# Patient Record
Sex: Male | Born: 1935 | Race: White | Hispanic: No | Marital: Married | Smoking: Never smoker
Health system: Southern US, Community
[De-identification: ages and names within clinical notes are randomized; demographics above are authoritative.]

## PROBLEM LIST (undated history)

## (undated) DIAGNOSIS — Z992 Dependence on renal dialysis: Secondary | ICD-10-CM

## (undated) DIAGNOSIS — C4491 Basal cell carcinoma of skin, unspecified: Secondary | ICD-10-CM

## (undated) DIAGNOSIS — N2581 Secondary hyperparathyroidism of renal origin: Secondary | ICD-10-CM

## (undated) DIAGNOSIS — I10 Essential (primary) hypertension: Secondary | ICD-10-CM

## (undated) DIAGNOSIS — N186 End stage renal disease: Secondary | ICD-10-CM

## (undated) DIAGNOSIS — N400 Enlarged prostate without lower urinary tract symptoms: Secondary | ICD-10-CM

## (undated) DIAGNOSIS — G4730 Sleep apnea, unspecified: Secondary | ICD-10-CM

## (undated) DIAGNOSIS — R9439 Abnormal result of other cardiovascular function study: Secondary | ICD-10-CM

## (undated) DIAGNOSIS — I639 Cerebral infarction, unspecified: Secondary | ICD-10-CM

## (undated) DIAGNOSIS — Z8719 Personal history of other diseases of the digestive system: Secondary | ICD-10-CM

## (undated) DIAGNOSIS — I739 Peripheral vascular disease, unspecified: Secondary | ICD-10-CM

## (undated) DIAGNOSIS — F0390 Unspecified dementia without behavioral disturbance: Secondary | ICD-10-CM

## (undated) DIAGNOSIS — D649 Anemia, unspecified: Secondary | ICD-10-CM

## (undated) DIAGNOSIS — Z8619 Personal history of other infectious and parasitic diseases: Secondary | ICD-10-CM

## (undated) HISTORY — DX: Anemia, unspecified: D64.9

## (undated) HISTORY — PX: ARTERIOVENOUS GRAFT PLACEMENT: SUR1029

## (undated) HISTORY — PX: THROMBECTOMY / ARTERIOVENOUS GRAFT REVISION: SUR1351

## (undated) HISTORY — PX: HERNIA REPAIR: SHX51

## (undated) HISTORY — DX: Peripheral vascular disease, unspecified: I73.9

## (undated) HISTORY — DX: Personal history of other infectious and parasitic diseases: Z86.19

## (undated) HISTORY — PX: CATARACT EXTRACTION: SUR2

## (undated) HISTORY — DX: Essential (primary) hypertension: I10

## (undated) HISTORY — PX: TONSILLECTOMY AND ADENOIDECTOMY: SUR1326

## (undated) HISTORY — DX: Benign prostatic hyperplasia without lower urinary tract symptoms: N40.0

## (undated) HISTORY — PX: APPENDECTOMY: SHX54

## (undated) HISTORY — DX: Secondary hyperparathyroidism of renal origin: N25.81

## (undated) HISTORY — DX: Cerebral infarction, unspecified: I63.9

---

## 1999-07-08 ENCOUNTER — Ambulatory Visit (HOSPITAL_COMMUNITY): Admission: RE | Admit: 1999-07-08 | Discharge: 1999-07-08 | Payer: Self-pay | Admitting: Family Medicine

## 1999-07-08 ENCOUNTER — Encounter: Payer: Self-pay | Admitting: Family Medicine

## 1999-08-13 ENCOUNTER — Encounter: Payer: Self-pay | Admitting: Ophthalmology

## 1999-08-18 ENCOUNTER — Ambulatory Visit (HOSPITAL_COMMUNITY): Admission: RE | Admit: 1999-08-18 | Discharge: 1999-08-18 | Payer: Self-pay | Admitting: Ophthalmology

## 2003-03-23 DIAGNOSIS — R9439 Abnormal result of other cardiovascular function study: Secondary | ICD-10-CM

## 2003-03-23 DIAGNOSIS — I639 Cerebral infarction, unspecified: Secondary | ICD-10-CM

## 2003-03-23 HISTORY — PX: FEMORAL BYPASS: SHX50

## 2003-03-23 HISTORY — DX: Cerebral infarction, unspecified: I63.9

## 2003-03-23 HISTORY — DX: Abnormal result of other cardiovascular function study: R94.39

## 2003-11-05 ENCOUNTER — Ambulatory Visit (HOSPITAL_COMMUNITY): Admission: RE | Admit: 2003-11-05 | Discharge: 2003-11-05 | Payer: Self-pay | Admitting: Family Medicine

## 2003-11-06 ENCOUNTER — Encounter: Admission: RE | Admit: 2003-11-06 | Discharge: 2003-11-06 | Payer: Self-pay | Admitting: Family Medicine

## 2003-11-28 ENCOUNTER — Encounter: Admission: RE | Admit: 2003-11-28 | Discharge: 2003-11-28 | Payer: Self-pay | Admitting: Family Medicine

## 2004-01-22 ENCOUNTER — Ambulatory Visit (HOSPITAL_COMMUNITY): Admission: RE | Admit: 2004-01-22 | Discharge: 2004-01-22 | Payer: Self-pay | Admitting: Vascular Surgery

## 2004-02-25 ENCOUNTER — Encounter (INDEPENDENT_AMBULATORY_CARE_PROVIDER_SITE_OTHER): Payer: Self-pay | Admitting: *Deleted

## 2004-02-25 ENCOUNTER — Inpatient Hospital Stay (HOSPITAL_COMMUNITY): Admission: AD | Admit: 2004-02-25 | Discharge: 2004-03-17 | Payer: Self-pay | Admitting: Vascular Surgery

## 2004-04-03 ENCOUNTER — Inpatient Hospital Stay (HOSPITAL_COMMUNITY): Admission: EM | Admit: 2004-04-03 | Discharge: 2004-04-16 | Payer: Self-pay | Admitting: Emergency Medicine

## 2004-04-29 ENCOUNTER — Inpatient Hospital Stay (HOSPITAL_COMMUNITY): Admission: EM | Admit: 2004-04-29 | Discharge: 2004-05-05 | Payer: Self-pay | Admitting: Emergency Medicine

## 2004-09-08 ENCOUNTER — Inpatient Hospital Stay (HOSPITAL_COMMUNITY): Admission: EM | Admit: 2004-09-08 | Discharge: 2004-09-16 | Payer: Self-pay | Admitting: Emergency Medicine

## 2004-11-13 ENCOUNTER — Inpatient Hospital Stay (HOSPITAL_COMMUNITY): Admission: EM | Admit: 2004-11-13 | Discharge: 2004-11-17 | Payer: Self-pay | Admitting: Emergency Medicine

## 2004-12-25 ENCOUNTER — Ambulatory Visit: Payer: Self-pay | Admitting: Physical Medicine & Rehabilitation

## 2004-12-25 ENCOUNTER — Inpatient Hospital Stay (HOSPITAL_COMMUNITY): Admission: EM | Admit: 2004-12-25 | Discharge: 2004-12-30 | Payer: Self-pay | Admitting: Emergency Medicine

## 2004-12-26 ENCOUNTER — Encounter (INDEPENDENT_AMBULATORY_CARE_PROVIDER_SITE_OTHER): Payer: Self-pay | Admitting: *Deleted

## 2004-12-26 HISTORY — PX: ABOVE KNEE LEG AMPUTATION: SUR20

## 2006-05-28 ENCOUNTER — Ambulatory Visit (HOSPITAL_COMMUNITY): Admission: RE | Admit: 2006-05-28 | Discharge: 2006-05-28 | Payer: Self-pay | Admitting: Vascular Surgery

## 2006-05-28 ENCOUNTER — Ambulatory Visit: Payer: Self-pay | Admitting: Vascular Surgery

## 2006-06-30 ENCOUNTER — Encounter: Admission: RE | Admit: 2006-06-30 | Discharge: 2006-06-30 | Payer: Self-pay | Admitting: Nephrology

## 2006-08-16 ENCOUNTER — Ambulatory Visit (HOSPITAL_COMMUNITY): Admission: RE | Admit: 2006-08-16 | Discharge: 2006-08-16 | Payer: Self-pay | Admitting: Nephrology

## 2006-08-23 ENCOUNTER — Encounter: Admission: RE | Admit: 2006-08-23 | Discharge: 2006-08-23 | Payer: Self-pay | Admitting: Nephrology

## 2006-08-25 ENCOUNTER — Encounter: Admission: RE | Admit: 2006-08-25 | Discharge: 2006-08-25 | Payer: Self-pay | Admitting: Nephrology

## 2007-02-02 ENCOUNTER — Ambulatory Visit (HOSPITAL_COMMUNITY): Admission: RE | Admit: 2007-02-02 | Discharge: 2007-02-02 | Payer: Self-pay | Admitting: Nephrology

## 2007-11-23 ENCOUNTER — Ambulatory Visit (HOSPITAL_COMMUNITY): Admission: RE | Admit: 2007-11-23 | Discharge: 2007-11-23 | Payer: Self-pay | Admitting: Vascular Surgery

## 2007-11-23 ENCOUNTER — Ambulatory Visit: Payer: Self-pay | Admitting: Vascular Surgery

## 2008-01-18 ENCOUNTER — Ambulatory Visit (HOSPITAL_COMMUNITY): Admission: RE | Admit: 2008-01-18 | Discharge: 2008-01-18 | Payer: Self-pay | Admitting: Nephrology

## 2008-04-25 ENCOUNTER — Ambulatory Visit (HOSPITAL_COMMUNITY): Admission: RE | Admit: 2008-04-25 | Discharge: 2008-04-25 | Payer: Self-pay | Admitting: Nephrology

## 2008-08-13 ENCOUNTER — Ambulatory Visit (HOSPITAL_COMMUNITY): Admission: RE | Admit: 2008-08-13 | Discharge: 2008-08-13 | Payer: Self-pay | Admitting: Nephrology

## 2008-12-19 ENCOUNTER — Ambulatory Visit (HOSPITAL_COMMUNITY): Admission: RE | Admit: 2008-12-19 | Discharge: 2008-12-19 | Payer: Self-pay | Admitting: Nephrology

## 2009-01-21 ENCOUNTER — Ambulatory Visit: Payer: Self-pay | Admitting: Vascular Surgery

## 2009-05-20 ENCOUNTER — Ambulatory Visit: Payer: Self-pay | Admitting: Vascular Surgery

## 2009-05-20 ENCOUNTER — Ambulatory Visit (HOSPITAL_COMMUNITY): Admission: RE | Admit: 2009-05-20 | Discharge: 2009-05-20 | Payer: Self-pay | Admitting: Surgery

## 2009-05-22 ENCOUNTER — Ambulatory Visit: Payer: Self-pay | Admitting: Cardiology

## 2009-05-22 ENCOUNTER — Inpatient Hospital Stay (HOSPITAL_COMMUNITY): Admission: EM | Admit: 2009-05-22 | Discharge: 2009-05-24 | Payer: Self-pay | Admitting: Emergency Medicine

## 2009-05-23 ENCOUNTER — Encounter: Payer: Self-pay | Admitting: Cardiology

## 2009-05-23 ENCOUNTER — Encounter (INDEPENDENT_AMBULATORY_CARE_PROVIDER_SITE_OTHER): Payer: Self-pay | Admitting: Internal Medicine

## 2009-06-05 ENCOUNTER — Encounter (INDEPENDENT_AMBULATORY_CARE_PROVIDER_SITE_OTHER): Payer: Self-pay | Admitting: Family Medicine

## 2009-07-08 ENCOUNTER — Ambulatory Visit: Payer: Self-pay | Admitting: Vascular Surgery

## 2009-07-08 ENCOUNTER — Ambulatory Visit (HOSPITAL_COMMUNITY): Admission: RE | Admit: 2009-07-08 | Discharge: 2009-07-08 | Payer: Self-pay | Admitting: Vascular Surgery

## 2009-09-23 ENCOUNTER — Ambulatory Visit (HOSPITAL_COMMUNITY): Admission: RE | Admit: 2009-09-23 | Discharge: 2009-09-23 | Payer: Self-pay | Admitting: Nephrology

## 2009-10-14 ENCOUNTER — Ambulatory Visit (HOSPITAL_COMMUNITY): Admission: RE | Admit: 2009-10-14 | Discharge: 2009-10-14 | Payer: Self-pay | Admitting: Nephrology

## 2010-04-12 ENCOUNTER — Encounter: Payer: Self-pay | Admitting: Nephrology

## 2010-04-21 ENCOUNTER — Ambulatory Visit (HOSPITAL_COMMUNITY)
Admission: RE | Admit: 2010-04-21 | Discharge: 2010-04-21 | Payer: Self-pay | Source: Home / Self Care | Attending: Nephrology | Admitting: Nephrology

## 2010-05-29 ENCOUNTER — Other Ambulatory Visit (HOSPITAL_COMMUNITY): Payer: Self-pay | Admitting: Nephrology

## 2010-05-29 DIAGNOSIS — N186 End stage renal disease: Secondary | ICD-10-CM

## 2010-06-11 ENCOUNTER — Ambulatory Visit (HOSPITAL_COMMUNITY)
Admission: RE | Admit: 2010-06-11 | Discharge: 2010-06-11 | Disposition: A | Discharge status: Home / Self Care | Payer: Medicare Other | Source: Ambulatory Visit | Attending: Nephrology | Admitting: Nephrology

## 2010-06-11 ENCOUNTER — Other Ambulatory Visit (HOSPITAL_COMMUNITY): Payer: Self-pay | Admitting: Nephrology

## 2010-06-11 DIAGNOSIS — N186 End stage renal disease: Secondary | ICD-10-CM

## 2010-06-11 DIAGNOSIS — Z8673 Personal history of transient ischemic attack (TIA), and cerebral infarction without residual deficits: Secondary | ICD-10-CM | POA: Insufficient documentation

## 2010-06-11 DIAGNOSIS — I739 Peripheral vascular disease, unspecified: Secondary | ICD-10-CM | POA: Insufficient documentation

## 2010-06-11 DIAGNOSIS — T82898A Other specified complication of vascular prosthetic devices, implants and grafts, initial encounter: Secondary | ICD-10-CM | POA: Insufficient documentation

## 2010-06-11 DIAGNOSIS — I120 Hypertensive chronic kidney disease with stage 5 chronic kidney disease or end stage renal disease: Secondary | ICD-10-CM | POA: Insufficient documentation

## 2010-06-11 DIAGNOSIS — E785 Hyperlipidemia, unspecified: Secondary | ICD-10-CM | POA: Insufficient documentation

## 2010-06-11 DIAGNOSIS — D649 Anemia, unspecified: Secondary | ICD-10-CM | POA: Insufficient documentation

## 2010-06-11 DIAGNOSIS — N400 Enlarged prostate without lower urinary tract symptoms: Secondary | ICD-10-CM | POA: Insufficient documentation

## 2010-06-11 DIAGNOSIS — N2581 Secondary hyperparathyroidism of renal origin: Secondary | ICD-10-CM | POA: Insufficient documentation

## 2010-06-11 DIAGNOSIS — Y832 Surgical operation with anastomosis, bypass or graft as the cause of abnormal reaction of the patient, or of later complication, without mention of misadventure at the time of the procedure: Secondary | ICD-10-CM | POA: Insufficient documentation

## 2010-06-11 MED ORDER — IOHEXOL 300 MG/ML  SOLN
100.0000 mL | Freq: Once | INTRAMUSCULAR | Status: AC | PRN
  Administered 2010-06-11: 40 mL via INTRAVENOUS

## 2010-06-12 LAB — COMPREHENSIVE METABOLIC PANEL
ALT: <8 U/L (ref 0–53)
AST: 17 U/L (ref 0–37)
Alkaline Phosphatase: 86 U/L (ref 39–117)
CO2: 29 mEq/L (ref 19–32)
Calcium: 8.6 mg/dL (ref 8.4–10.5)
GFR calc Af Amer: 21 mL/min — ABNORMAL LOW (ref >60)
GFR calc non Af Amer: 17 mL/min — ABNORMAL LOW (ref >60)
Potassium: 3.4 mEq/L — ABNORMAL LOW (ref 3.5–5.1)
Sodium: 136 mEq/L (ref 135–145)
Total Protein: 6.3 g/dL (ref 6.0–8.3)

## 2010-06-12 LAB — BASIC METABOLIC PANEL
BUN: 29 mg/dL — ABNORMAL HIGH (ref 6–23)
Calcium: 8.3 mg/dL — ABNORMAL LOW (ref 8.4–10.5)
Chloride: 98 mEq/L (ref 96–112)
Creatinine, Ser: 4.04 mg/dL — ABNORMAL HIGH (ref 0.4–1.5)
GFR calc Af Amer: 17 mL/min — ABNORMAL LOW (ref >60)
Glucose, Bld: 95 mg/dL (ref 70–99)
Potassium: 3.7 mEq/L (ref 3.5–5.1)

## 2010-06-12 LAB — CARDIAC PANEL(CRET KIN+CKTOT+MB+TROPI)
CK, MB: 4.5 ng/mL — ABNORMAL HIGH (ref 0.3–4.0)
CK, MB: 4.8 ng/mL — ABNORMAL HIGH (ref 0.3–4.0)
CK, MB: 5.6 ng/mL — ABNORMAL HIGH (ref 0.3–4.0)
Relative Index: 3.2 — ABNORMAL HIGH (ref 0.0–2.5)
Relative Index: 3.3 — ABNORMAL HIGH (ref 0.0–2.5)
Total CK: 152 U/L (ref 7–232)
Total CK: 169 U/L (ref 7–232)

## 2010-06-12 LAB — LIPID PANEL
Cholesterol: 173 mg/dL (ref 0–200)
HDL: 34 mg/dL — ABNORMAL LOW (ref >39)
LDL Cholesterol: 110 mg/dL — ABNORMAL HIGH (ref 0–99)
Total CHOL/HDL Ratio: 5.1 RATIO
Triglycerides: 143 mg/dL (ref <150)
VLDL: 29 mg/dL (ref 0–40)

## 2010-06-12 LAB — RENAL FUNCTION PANEL
Albumin: 3 g/dL — ABNORMAL LOW (ref 3.5–5.2)
BUN: 41 mg/dL — ABNORMAL HIGH (ref 6–23)
Creatinine, Ser: 6.12 mg/dL — ABNORMAL HIGH (ref 0.4–1.5)
GFR calc Af Amer: 11 mL/min — ABNORMAL LOW (ref >60)
Phosphorus: 5.2 mg/dL — ABNORMAL HIGH (ref 2.3–4.6)

## 2010-06-12 LAB — DIFFERENTIAL
Basophils Absolute: 0 10*3/uL (ref 0.0–0.1)
Basophils Relative: 0 % (ref 0–1)
Eosinophils Absolute: 0.1 10*3/uL (ref 0.0–0.7)
Eosinophils Relative: 1 % (ref 0–5)

## 2010-06-12 LAB — CBC
HCT: 27.3 % — ABNORMAL LOW (ref 39.0–52.0)
HCT: 28.2 % — ABNORMAL LOW (ref 39.0–52.0)
Hemoglobin: 9 g/dL — ABNORMAL LOW (ref 13.0–17.0)
Hemoglobin: 9.4 g/dL — ABNORMAL LOW (ref 13.0–17.0)
MCHC: 33.7 g/dL (ref 30.0–36.0)
MCHC: 34.3 g/dL (ref 30.0–36.0)
MCV: 96.3 fL (ref 78.0–100.0)
MCV: 97 fL (ref 78.0–100.0)
Platelets: 160 10*3/uL (ref 150–400)
Platelets: 174 10*3/uL (ref 150–400)
RBC: 2.72 MIL/uL — ABNORMAL LOW (ref 4.22–5.81)
RBC: 2.88 MIL/uL — ABNORMAL LOW (ref 4.22–5.81)
RDW: 15.9 % — ABNORMAL HIGH (ref 11.5–15.5)
RDW: 16.7 % — ABNORMAL HIGH (ref 11.5–15.5)
WBC: 7.9 10*3/uL (ref 4.0–10.5)

## 2010-06-12 LAB — POCT I-STAT 4, (NA,K, GLUC, HGB,HCT)
Hemoglobin: 10.9 g/dL — ABNORMAL LOW (ref 13.0–17.0)
Potassium: 5.7 mEq/L — ABNORMAL HIGH (ref 3.5–5.1)

## 2010-06-12 LAB — PROTIME-INR
INR: 1 (ref 0.00–1.49)
Prothrombin Time: 13.1 seconds (ref 11.6–15.2)

## 2010-06-12 LAB — POCT CARDIAC MARKERS: Troponin i, poc: <0.05 ng/mL (ref 0.00–0.09)

## 2010-07-01 ENCOUNTER — Other Ambulatory Visit (HOSPITAL_COMMUNITY): Payer: Self-pay | Admitting: Nephrology

## 2010-07-01 DIAGNOSIS — N186 End stage renal disease: Secondary | ICD-10-CM

## 2010-07-02 ENCOUNTER — Other Ambulatory Visit (HOSPITAL_COMMUNITY): Payer: Self-pay | Admitting: Nephrology

## 2010-07-02 ENCOUNTER — Ambulatory Visit (HOSPITAL_COMMUNITY)
Admission: RE | Admit: 2010-07-02 | Discharge: 2010-07-02 | Disposition: A | Discharge status: Home / Self Care | Payer: Medicare Other | Source: Ambulatory Visit | Attending: Nephrology | Admitting: Nephrology

## 2010-07-02 DIAGNOSIS — N186 End stage renal disease: Secondary | ICD-10-CM

## 2010-07-02 DIAGNOSIS — Y832 Surgical operation with anastomosis, bypass or graft as the cause of abnormal reaction of the patient, or of later complication, without mention of misadventure at the time of the procedure: Secondary | ICD-10-CM | POA: Insufficient documentation

## 2010-07-02 DIAGNOSIS — T82898A Other specified complication of vascular prosthetic devices, implants and grafts, initial encounter: Secondary | ICD-10-CM | POA: Insufficient documentation

## 2010-07-02 MED ORDER — IOHEXOL 300 MG/ML  SOLN: 50.0000 mL | Freq: Once | INTRAMUSCULAR | Status: AC | PRN

## 2010-07-06 ENCOUNTER — Ambulatory Visit (HOSPITAL_COMMUNITY)
Admission: RE | Admit: 2010-07-06 | Discharge: 2010-07-06 | Disposition: A | Discharge status: Home / Self Care | Payer: Medicare Other | Source: Ambulatory Visit | Attending: Vascular Surgery | Admitting: Vascular Surgery

## 2010-07-06 DIAGNOSIS — Z8673 Personal history of transient ischemic attack (TIA), and cerebral infarction without residual deficits: Secondary | ICD-10-CM | POA: Insufficient documentation

## 2010-07-06 DIAGNOSIS — I120 Hypertensive chronic kidney disease with stage 5 chronic kidney disease or end stage renal disease: Secondary | ICD-10-CM | POA: Insufficient documentation

## 2010-07-06 DIAGNOSIS — T82898A Other specified complication of vascular prosthetic devices, implants and grafts, initial encounter: Secondary | ICD-10-CM | POA: Insufficient documentation

## 2010-07-06 DIAGNOSIS — Z7982 Long term (current) use of aspirin: Secondary | ICD-10-CM | POA: Insufficient documentation

## 2010-07-06 DIAGNOSIS — Z992 Dependence on renal dialysis: Secondary | ICD-10-CM | POA: Insufficient documentation

## 2010-07-06 DIAGNOSIS — Z79899 Other long term (current) drug therapy: Secondary | ICD-10-CM | POA: Insufficient documentation

## 2010-07-06 DIAGNOSIS — N186 End stage renal disease: Secondary | ICD-10-CM | POA: Insufficient documentation

## 2010-07-06 DIAGNOSIS — Y832 Surgical operation with anastomosis, bypass or graft as the cause of abnormal reaction of the patient, or of later complication, without mention of misadventure at the time of the procedure: Secondary | ICD-10-CM | POA: Insufficient documentation

## 2010-07-06 DIAGNOSIS — I739 Peripheral vascular disease, unspecified: Secondary | ICD-10-CM | POA: Insufficient documentation

## 2010-07-07 ENCOUNTER — Emergency Department (HOSPITAL_COMMUNITY): Payer: Medicare Other

## 2010-07-07 ENCOUNTER — Ambulatory Visit (HOSPITAL_COMMUNITY)
Admission: EM | Admit: 2010-07-07 | Discharge: 2010-07-07 | Disposition: A | Discharge status: Home / Self Care | Payer: Medicare Other | Attending: Emergency Medicine | Admitting: Emergency Medicine

## 2010-07-07 ENCOUNTER — Ambulatory Visit (HOSPITAL_COMMUNITY): Payer: Medicare Other

## 2010-07-07 ENCOUNTER — Observation Stay (HOSPITAL_COMMUNITY)
Admission: AD | Admit: 2010-07-07 | Discharge: 2010-07-08 | Disposition: A | Discharge status: Home / Self Care | Payer: Medicare Other | Source: Ambulatory Visit | Attending: Vascular Surgery | Admitting: Vascular Surgery

## 2010-07-07 DIAGNOSIS — W010XXA Fall on same level from slipping, tripping and stumbling without subsequent striking against object, initial encounter: Secondary | ICD-10-CM | POA: Insufficient documentation

## 2010-07-07 DIAGNOSIS — N186 End stage renal disease: Secondary | ICD-10-CM | POA: Insufficient documentation

## 2010-07-07 DIAGNOSIS — J449 Chronic obstructive pulmonary disease, unspecified: Secondary | ICD-10-CM | POA: Insufficient documentation

## 2010-07-07 DIAGNOSIS — I120 Hypertensive chronic kidney disease with stage 5 chronic kidney disease or end stage renal disease: Secondary | ICD-10-CM | POA: Insufficient documentation

## 2010-07-07 DIAGNOSIS — J4489 Other specified chronic obstructive pulmonary disease: Secondary | ICD-10-CM | POA: Insufficient documentation

## 2010-07-07 DIAGNOSIS — S78119A Complete traumatic amputation at level between unspecified hip and knee, initial encounter: Secondary | ICD-10-CM | POA: Insufficient documentation

## 2010-07-07 DIAGNOSIS — E785 Hyperlipidemia, unspecified: Secondary | ICD-10-CM | POA: Insufficient documentation

## 2010-07-07 DIAGNOSIS — T82898A Other specified complication of vascular prosthetic devices, implants and grafts, initial encounter: Secondary | ICD-10-CM

## 2010-07-07 DIAGNOSIS — Z8673 Personal history of transient ischemic attack (TIA), and cerebral infarction without residual deficits: Secondary | ICD-10-CM | POA: Insufficient documentation

## 2010-07-07 DIAGNOSIS — I739 Peripheral vascular disease, unspecified: Secondary | ICD-10-CM | POA: Insufficient documentation

## 2010-07-07 DIAGNOSIS — Z992 Dependence on renal dialysis: Secondary | ICD-10-CM | POA: Insufficient documentation

## 2010-07-07 DIAGNOSIS — Y92009 Unspecified place in unspecified non-institutional (private) residence as the place of occurrence of the external cause: Secondary | ICD-10-CM | POA: Insufficient documentation

## 2010-07-07 DIAGNOSIS — R0602 Shortness of breath: Secondary | ICD-10-CM | POA: Insufficient documentation

## 2010-07-07 LAB — BASIC METABOLIC PANEL
Calcium: 8.6 mg/dL (ref 8.4–10.5)
GFR calc Af Amer: 5 mL/min — ABNORMAL LOW (ref >60)
Potassium: 4.3 mEq/L (ref 3.5–5.1)
Sodium: 137 mEq/L (ref 135–145)

## 2010-07-07 LAB — POCT I-STAT 4, (NA,K, GLUC, HGB,HCT)
HCT: 38 % — ABNORMAL LOW (ref 39.0–52.0)
Hemoglobin: 12.9 g/dL — ABNORMAL LOW (ref 13.0–17.0)

## 2010-07-08 ENCOUNTER — Observation Stay (HOSPITAL_COMMUNITY): Payer: Medicare Other

## 2010-07-08 LAB — CBC
HCT: 27.9 % — ABNORMAL LOW (ref 39.0–52.0)
MCH: 32.1 pg (ref 26.0–34.0)
MCHC: 33.3 g/dL (ref 30.0–36.0)
MCV: 96.2 fL (ref 78.0–100.0)
RDW: 16.5 % — ABNORMAL HIGH (ref 11.5–15.5)

## 2010-07-08 LAB — RENAL FUNCTION PANEL
BUN: 109 mg/dL — ABNORMAL HIGH (ref 6–23)
Calcium: 8.4 mg/dL (ref 8.4–10.5)
Glucose, Bld: 111 mg/dL — ABNORMAL HIGH (ref 70–99)
Phosphorus: 8.6 mg/dL — ABNORMAL HIGH (ref 2.3–4.6)

## 2010-07-08 LAB — POCT I-STAT 4, (NA,K, GLUC, HGB,HCT)
Potassium: 4.4 mEq/L (ref 3.5–5.1)
Sodium: 137 mEq/L (ref 135–145)

## 2010-07-09 NOTE — Op Note (Signed)
  NAME:  Logan [name], Logan [name]          ACCOUNT NO.:  [id number]  MEDICAL RECORD NO.:  [id number]           PATIENT TYPE:  O  LOCATION:  6727                         FACILITY:  [facility]  PHYSICIAN:  Di Kindle. Edilia Bo, M.D.DATE OF BIRTH:  Apr 18, 1919  DATE OF PROCEDURE: DATE OF DISCHARGE:                              OPERATIVE REPORT   PREOPERATIVE DIAGNOSIS:  Chronic kidney disease.  POSTOPERATIVE DIAGNOSIS:  Chronic kidney disease.  PROCEDURE:  Ultrasound-guided placement of a right IJ 19-cm Diatek catheter.  SURGEON:  Di Kindle. Edilia Bo, M.D.  ANESTHESIA:  Local with sedation technique.  The patient was taken to the operating room sedated by Anesthesia.  The neck and upper chest were prepped and draped in the usual sterile fashion.  After the skin was anesthetized with 1% lidocaine and under ultrasound guidance, the right IJ was cannulated and a guidewire introduced into the superior vena cava under fluoroscopic control. Tract over the wire was dilated and then a dilator and peel-away sheath were advanced over the wire and the wire and dilator removed.  The catheter was passed through the peel-away sheath and positioned in the right atrium.  The exit site for the catheter was selected and the skin anesthetized between the 2 areas.  Catheter was then brought through the tunnel cut to the appropriate length and the distal ports were attached. Both ports withdrew easily.  We then flushed with heparinized saline and filled with concentrated heparin.  Catheter was secured at its exit site with a 3-0 nylon suture.  The IJ cannulation site was closed with a 4-0 subcuticular stitch.  A sterile dressing was applied.  The patient tolerated the procedure well, was transferred to recovery room in stable condition.  All needle and sponge counts were correct.     Di Kindle. Edilia Bo, M.D.     CSD/MEDQ  D:  07/07/2010  T:  07/08/2010  Job:  [id number]  Electronically Signed by  Waverly Ferrari M.D. on 07/09/2010 01:10:31 PM

## 2010-07-13 NOTE — Op Note (Signed)
NAME:  Logan [name], Logan [name] NO.:  [id number]  MEDICAL RECORD NO.:  [id number]           PATIENT TYPE:  O  LOCATION:  SDSC                         FACILITY:  [facility]  PHYSICIAN:  Janetta Hora. Kareem Aul, MD  DATE OF BIRTH:  1920-01-09  DATE OF PROCEDURE:  07/06/2010 DATE OF DISCHARGE:                              OPERATIVE REPORT   PROCEDURE:  Thrombectomy revision, left upper arm arteriovenous graft.  PREOPERATIVE DIAGNOSIS:  Thrombosed arteriovenous graft, left arm.  POSTOPERATIVE DIAGNOSIS:  Thrombosed arteriovenous graft, left arm.  ANESTHESIA:  Local with IV sedation.  SURGEON:  Janetta Hora. Desmund Elman, MD  ASSISTANT:  Della Goo, PA-C  OPERATIVE FINDINGS: 1. A 6-mm interposition graft to high axillary vein. 2. Diffuse thickening of axillary subclavian system, left side.  OPERATIVE DETAILS:  After obtaining informed consent, the patient was taken to the operating room.  The patient was placed in supine position on the operating table.  After adequate sedation, the patient's entire left upper extremity was prepped and draped in the usual sterile fashion.  Local anesthesia was infiltrated at a preexisting longitudinal scar up in the left axilla.  Incision was reopened and carried down through the subcutaneous tissues down the level of graft.  Grafts was dissected free circumferentially.  It was well incorporated.  Dissection was then carried up on to the venous anastomosis and to the vein distal to this.  The vein was fairly sclerotic and external appearance and I dissected this very high up into the axilla to get to a more normal- appearing segment of vein.  Vein was dissected free in this location. Next, the patient was given 5000 units of intravenous heparin.  A transverse graftotomy was made just below the venous anastomosis and a #4 Fogarty catheter was used to thrombectomize the venous limb of the graft.  Catheter passed into the central venous system, but  there were several areas of resistance throughout the subclavian axillary system. I took down the venous anastomosis and then opened the vein longitudinally and debrided away the thickened segment at the anastomosis.  The vein was diffusely thickened above this, but of reasonable quality.  The worst sections of vein were debrided away.  The vein was controlled proximally with a fine bulldog clamp.  Next, the arterial limb of graft was thrombectomized with multiple passes of #4 Fogarty catheter.  An arterialized plug was retrieved and this was easily thrombectomized with excellent arterial inflow.  Graft was then clamped proximally.  Then, a 6-mm interposition graft was brought up in the operative field and this was beveled on the distal end and sewn end to end to the distal axillary vein using a running 6-0 Prolene suture. Just prior to completion of the anastomosis, this was forebled, backbled, and thoroughly flushed.  Anastomosis was secured.  Everything was thoroughly backbled and there was good venous backbleeding at this point.  The graft was then cut to length and sewn end to end to the proximal portion of the graft using a running 6-0 Prolene suture.  Just prior to completion of anastomosis, this was forebled, backbled, and thoroughly flushed.  Anastomosis was secured.  Clamps were released. There  was palpable thrill in the graft immediately.  Hemostasis was obtained with thrombin and Gelfoam and administration of 50 mg protamine.  Subcutaneous tissues were then reapproximated using running 3-0 Vicryl suture.  The skin was closed with 4-0 Vicryl subcuticular stitch.  The patient tolerated the procedure well and there were no complications.  Instrument, sponge, and needle counts were correct at the end of the case.  The patient was taken to the recovery room in stable condition.     Janetta Hora. Delanee Xin, MD     CEF/MEDQ  D:  07/06/2010  T:  07/07/2010  Job:   [id number]  Electronically Signed by Fabienne Bruns MD on 07/13/2010 11:07:45 AM

## 2010-07-14 ENCOUNTER — Ambulatory Visit: Payer: Medicare Other | Admitting: Vascular Surgery

## 2010-07-22 ENCOUNTER — Ambulatory Visit: Payer: Medicare Other | Admitting: Vascular Surgery

## 2010-07-30 ENCOUNTER — Ambulatory Visit (INDEPENDENT_AMBULATORY_CARE_PROVIDER_SITE_OTHER): Payer: Medicare Other | Admitting: Vascular Surgery

## 2010-07-30 ENCOUNTER — Encounter (INDEPENDENT_AMBULATORY_CARE_PROVIDER_SITE_OTHER): Payer: Medicare Other

## 2010-07-30 DIAGNOSIS — Z01810 Encounter for preprocedural cardiovascular examination: Secondary | ICD-10-CM

## 2010-07-30 DIAGNOSIS — N184 Chronic kidney disease, stage 4 (severe): Secondary | ICD-10-CM

## 2010-07-30 DIAGNOSIS — N186 End stage renal disease: Secondary | ICD-10-CM

## 2010-07-31 NOTE — Assessment & Plan Note (Signed)
OFFICE VISIT  Logan [name], Logan [name] DOB:  12-16-1919                                       07/30/2010 VFIEP#:[id number]  CHIEF COMPLAINT:  Needs hemodialysis access.  HISTORY OF PRESENT ILLNESS:  The patient is an 77 year old male who has been on chronic hemodialysis for several years.  He has previously had [name] left forearm and left upper arm graft.  Recently his left upper arm graft failed and [name] catheter was placed on the right side.  He has currently been dialyzing via this.  He presents today for further consideration of placement of [name] new hemodialysis access.  All his previous grafts have been in the left arm.  He has had no prior right access procedures.  CHRONIC MEDICAL PROBLEMS:  Include hypertension, end-stage renal disease and previous stroke.  All of these problems are currently stable.  He dialyzes on Monday, Wednesday and Friday.  REVIEW OF SYSTEMS:  He has no shortness of breath or chest pain currently.  PHYSICAL EXAM:  Vital signs:  Blood pressure is 136/54 in the right arm, heart rate 63 and regular.  Temperature is 98.3.  Right upper extremity has [name] 2+ brachial and radial pulse.  He has occluded grafts in the left upper extremity and left forearm.  Chest:  Clear to auscultation. Cardiac:  Regular rate and rhythm.  He had [name] vein mapping ultrasound performed today which I reviewed and interpreted.  The right cephalic vein is too small for consideration for use as an AV fistula.  It was between 20 and 30 mm in diameter.  Right basilic vein however is between 30 and 60 mm in diameter and I think would be [name] reasonable vein to consider for basilic vein transposition.  In summary, I believe the best option for the patient at this point for his next access would be consideration for placement of basilic vein transposition fistula.  Since he is fairly frail if the vein appears marginal we may do this as [name] two stage procedure.  However, if it is  [name] fairly robust vein we will plan on doing the entire transposition at the initial operation.  This is scheduled for 08/04/2010.  Risks, benefits, possible complications and procedure details were explained to the patient and his wife today.  They understand and agree to proceed.    Janetta Hora. Marquette Piontek, MD Electronically Signed  CEF/MEDQ  D:  07/30/2010  T:  07/31/2010  Job:  [id number]  cc:   Duke Salvia. Eliott Nine, M.D.

## 2010-08-04 ENCOUNTER — Ambulatory Visit (HOSPITAL_COMMUNITY)
Admission: RE | Admit: 2010-08-04 | Discharge: 2010-08-04 | Disposition: A | Discharge status: Home / Self Care | Payer: Medicare Other | Source: Ambulatory Visit | Attending: Vascular Surgery | Admitting: Vascular Surgery

## 2010-08-04 DIAGNOSIS — N186 End stage renal disease: Secondary | ICD-10-CM | POA: Insufficient documentation

## 2010-08-04 DIAGNOSIS — I120 Hypertensive chronic kidney disease with stage 5 chronic kidney disease or end stage renal disease: Secondary | ICD-10-CM

## 2010-08-04 DIAGNOSIS — Z8673 Personal history of transient ischemic attack (TIA), and cerebral infarction without residual deficits: Secondary | ICD-10-CM | POA: Insufficient documentation

## 2010-08-04 LAB — POCT I-STAT 4, (NA,K, GLUC, HGB,HCT)
Hemoglobin: 12.6 g/dL — ABNORMAL LOW (ref 13.0–17.0)
Potassium: 4.1 mEq/L (ref 3.5–5.1)

## 2010-08-04 LAB — SURGICAL PCR SCREEN: Staphylococcus aureus: NEGATIVE

## 2010-08-04 NOTE — Assessment & Plan Note (Signed)
OFFICE VISIT   Logan Logan [name], Logan Logan [name]  DOB:  1919/06/04                                       01/21/2009  VWUJW#:[id number]   The patient is an 77 year old male patient referred by Dr. 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  The patient has end-stage renal  disease and has had an AV graft in the left forearm utilized for the  last several years which has been revised by [place] most recently in  September of 2009 to the more proximal brachial vein.  He also has had  thrombotic episodes most recent of which was in September of 2010  treated by interventional radiology with lysis and PTA of the venous  anastomosis and venous outflow.  I have reviewed these images and agree  with the interpretation.  Graft at the present time is functioning  nicely.  He has had no history of bleeding, ulceration, infection or  pain in the left arm but has noticed Logan [name] slow dilatation of the medial  limb of the graft over the last few years.   Chronic medical problems which are stable and inactive:  1)  hypertension, 2) end-stage renal disease, 3) mild stroke in 2005.   REVIEW OF SYSTEMS:  Denies any chest pain, dyspnea on exertion, PND or  orthopnea.  No hemoptysis or any pulmonary symptoms.   SOCIAL HISTORY:  He is married and has 2 children.  Does not use tobacco  or alcohol.   PHYSICAL EXAMINATION:  Vital signs:  Blood pressure 148/66, heart rate  71, respirations 16.  General:  He is  alert and oriented x3.  Neck:  Is  supple, 3+ carotid pulses palpable with soft bruit on the right.  Neurological:  Normal.  No palpable adenopathy in the neck.  Chest:  Clear to auscultation.  Cardiovascular:  Regular rhythm.  No murmurs.  Abdomen:  Soft, nontender with no masses.  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  Right leg is intact with no evidence  infection.  There is an AV graft in the left forearm.  The medial half  which is the venous half has  diffuse aneurysm at the 7 o'clock position  measuring 3 cm x 6 cm.  There is no ulceration or thinning of the skin.  No tenderness and no erythema.  No fluctuance.  The graft is functioning  well with good pulse and palpable thrill.   I have reviewed the previous studies and the graft is functioning well  and there is no evidence of any eminent rupture or thinning of the skin.  I think you should continue to utilize this graft as long as it works.  If it does need revision we can replace the entire venous half.  There  have been two angioplasties performed of the venous outflow which may  signal that the graft will fail in the future and need replacement of  the medial half of the graft.   Logan Logan [name], M.D.  Electronically Signed   JDL/MEDQ  D:  01/21/2009  T:  01/22/2009  Job:  [id number]   cc:   Logan Logan [name]. Logan Logan [name], M.D.

## 2010-08-04 NOTE — Op Note (Signed)
NAME:  Logan [name], Logan [name]          ACCOUNT NO.:  [id number]   MEDICAL RECORD NO.:  [id number]          PATIENT TYPE:  AMB   LOCATION:  SDS                          FACILITY:  [facility]   PHYSICIAN:  Quita Skye. Hart Rochester, M.D.  DATE OF BIRTH:  October 01, 1919   DATE OF PROCEDURE:  11/23/2007  DATE OF DISCHARGE:  11/23/2007                               OPERATIVE REPORT   PREOPERATIVE DIAGNOSES:  1. Thrombosed arteriovenous Gore-Tex graft, left forearm.  2. End-stage renal disease.   POSTOPERATIVE DIAGNOSES:  1. Thrombosed arteriovenous Gore-Tex graft, left forearm.  2. End-stage renal disease.   OPERATION:  Thrombectomy arteriovenous Gore-Tex graft, left forearm,  with revision of venous anastomosis using an interposition 6-mm Gore-Tex  graft, brachial vein.   SURGEON:  Quita Skye. Hart Rochester, MD.   FIRST ASSISTANT:  Jerold Coombe, PA   ANESTHESIA:  Local.   PROCEDURE:  The patient was taken to the operating room, placed in  supine position at which time the left upper extremity was prepped with  Betadine scrub and solution, draped in a routine sterile manner.  After  infiltration of 1% of Xylocaine with epinephrine, longitudinal incision  was made through the previous scar in the mid upper arm with a graft  being previously revised.  Gore-Tex brachial vein anastomosis dissected  free proximally and distally.  Transverse opening made in the graft.  The graft itself was easily thrombectomized.  Excellent flow being  reestablished.  There was minimal hyperplasia at the venous anastomosis.  The vein proximal to this was 5 mm in size seemed slightly irregular  with a Fogarty catheter, but was widely patent, had good backbleeding.  Previous anastomosis was excised and a piece of 6-mm graft anastomosed  to end-to-end to the old graft and end-to-end to the proximal vein with  6-0 Prolene.  Clamps then released.  There was an excellent pulse and  palpable thrill in the graft.  No heparin or  protamine was given.  The  wound was irrigated with saline, closed in layers with Vicryl in  subcuticular fashion.  Sterile dressing applied.  The patient taken to  recovery in satisfactory condition.      Quita Skye Hart Rochester, M.D.  Electronically Signed     JDL/MEDQ  D:  11/23/2007  T:  11/24/2007  Job:  [id number]

## 2010-08-06 ENCOUNTER — Ambulatory Visit (HOSPITAL_COMMUNITY)
Admission: RE | Admit: 2010-08-06 | Discharge: 2010-08-06 | Disposition: A | Discharge status: Home / Self Care | Payer: Medicare Other | Source: Ambulatory Visit | Attending: Vascular Surgery | Admitting: Vascular Surgery

## 2010-08-06 ENCOUNTER — Ambulatory Visit (HOSPITAL_COMMUNITY): Payer: Medicare Other

## 2010-08-06 DIAGNOSIS — T82898A Other specified complication of vascular prosthetic devices, implants and grafts, initial encounter: Secondary | ICD-10-CM

## 2010-08-06 DIAGNOSIS — I120 Hypertensive chronic kidney disease with stage 5 chronic kidney disease or end stage renal disease: Secondary | ICD-10-CM

## 2010-08-06 DIAGNOSIS — N186 End stage renal disease: Secondary | ICD-10-CM | POA: Insufficient documentation

## 2010-08-06 DIAGNOSIS — Z992 Dependence on renal dialysis: Secondary | ICD-10-CM | POA: Insufficient documentation

## 2010-08-06 DIAGNOSIS — Z01812 Encounter for preprocedural laboratory examination: Secondary | ICD-10-CM | POA: Insufficient documentation

## 2010-08-06 DIAGNOSIS — Y832 Surgical operation with anastomosis, bypass or graft as the cause of abnormal reaction of the patient, or of later complication, without mention of misadventure at the time of the procedure: Secondary | ICD-10-CM | POA: Insufficient documentation

## 2010-08-06 LAB — POCT I-STAT 4, (NA,K, GLUC, HGB,HCT)
Potassium: 4 mEq/L (ref 3.5–5.1)
Sodium: 137 mEq/L (ref 135–145)

## 2010-08-07 NOTE — Discharge Summary (Signed)
NAME:  Logan [name], Logan [name] NO.:  [id number]   MEDICAL RECORD NO.:  [id number]          PATIENT TYPE:  INP   LOCATION:  5505                         FACILITY:  [facility]   PHYSICIAN:  Maree Krabbe, M.D.DATE OF BIRTH:  21-Sep-1919   DATE OF ADMISSION:  04/03/2004  DATE OF DISCHARGE:  04/16/2004                                 DISCHARGE SUMMARY   DISCHARGE DIAGNOSES:  1.  Recurrent Clostridium difficile with fecal incontinence, resolved.  2.  Anemia of chronic disease.  3.  Malnutrition with failure to thrive and deconditioning.  4.  End-stage renal disease.  5.  Secondary hyperparathyroidism.  6.  Peripheral vascular disease.   PROCEDURES:  1.  CT of abdomen with contrast.  Impression:      1.  Bilateral small pleural effusions with abdominal ascites.      2.  Multiple low-attenuation splenic lesions, possibly infarctions.      3.  Atheromatous abdominal aorta without aneurysm.  2.  CT of pelvis with contrast.  Impression:      1.  Mild diffuse wall thickening of the colon and rectum consistent with          pancolitis.  Favor infections over ischemic.      2.  Small amount of pelvic ascites.      3.  No evidence of perforation nor suggestion of abscess.  3.  On April 09, 2004, performed by D. Oley Balm, M.D., dialysis      shuntogram.  Impression:      1.  Technically successful 7 mm balloon angioplasty of outflow vein          stenosis with good angiographic result.      2.  Venous stenosis just central to the venous anastomosis with multiple          collateral filling, indicating hemodynamic significance.      3.  No central venous stenosis or occlusion.  4.  Hemodialysis.   CONSULTATIONS:  Janetta Hora. Darrick Penna, M.D.   HISTORY OF PRESENT ILLNESS:  Logan [name] is an 77 year old white male  with end-stage renal disease suspected secondary to hypertension and  renovascular disease.  He was hospitalized from February 24, 2004, through  March 09, 2004,  when he underwent left lower extremity bypass graft by  Dr. Darrick Penna.  An arteriogram was required and he already had stage 4 chronic  kidney disease.  It was determined preadmission that he would start dialysis  that hospitalization, which he did via left arm AV Gore-Tex graft.  His  hospital course was essentially uncomplicated and prolonged due to weakness  and slow recovery.  He ultimately required discharge to the nursing home.  The patient went on Keflex 500 mg b.i.d. for some mild cellulitis of the  left lower extremity.   Since discharge, the patient has been diagnosed with Clostridium difficile  colitis for which he completed a 10-14-day course of Flagyl.  Overall he  became progressively weaker.  He was seen in followup by Dr. Darrick Penna on  March 27, 2004, in his office and felt the patient was in too poor  condition to undergo  right lower extremity bypass, which he needs for his  ischemic right foot.  This week he has developed lethargy, anorexia,  productive cough, abdominal bloating and recurrence of his diarrhea.  He  last dialyzed on the day prior to admission, at which time empiric oral  vancomycin was started at 125 mg q.i.d.  Other diagnostic studies were  ordered, including stool cultures, blood cultures and chest x-ray, but these  results are unknown at that time.  Today the wife and daughter insisted on  bringing him to the emergency room due to his profound weakness and  lethargy.  In the emergency room, his vital signs were stable, his white  count is within normal limits, his chest x-ray suggests possibly an  infiltrate and he is being admitted to rule out sepsis and Clostridium  difficile colitis and receive empiric antibiotics.   ADMISSION LABORATORIES:  White blood cell count 9600, hemoglobin 11.9,  platelet count 188,000.  Sodium 141, potassium 3.6, chloride 98, CO2 30, BUN  52, creatinine 2.9, glucose 115, SGOT 60, SGPT 42, total bilirubin 0.6.  Urinalysis  positive for nitrites, small leukocyte esterase, 0-2 white cells  and 3-6 red blood cells.  Admission chest x-ray reveals questionable  infiltrate.   HOSPITAL COURSE:  #1 - RECURRENT CLOSTRIDIUM DIFFICILE WITH SUBSEQUENT FECAL  INCONTINENCE, RESOLVING:  The patient was admitted with the plan to rule out  sepsis, rule out Clostridium difficile, rule out urinary tract infection and  administer empiric antibiotics.  Flagyl was restarted, as well as Zosyn.  Clostridium difficile was positive on April 08, 2004.  Flagyl and Zosyn  were discontinued.  Pharmacy dose to begin oral vancomycin, which was given  q.i.d.  The patient was also given Questran in the interim.  The patient was  monitored carefully and clear fluids were given as appropriate.  A repeat  Clostridium difficile was performed on April 15, 2004, and was negative.   #2 - ANEMIA OF CHRONIC DISEASE:  The patient's hemoglobin on admission was  11.9.  He did  have an episode of drop in hemoglobin to 9.9 on April 11, 2004.  At that time, Epogen was increased.  A loading dose of InFeD was also  begun for five straight hemodialysis treatments.  The patient did not  require any blood transfusions during this admission.   #3 - MALNUTRITION WITH FAILURE TO THRIVE AND DECONDITIONING:  A nutrition  consult was obtained.  Recommendations were noted and carried out.  Albumin  was monitored and did range between 1.3-2.0 at highest.  The patient was  started on a course of Megace as well.  Physical therapy, as well as  occupational therapy were consulted to help the patient with his weakness  and deconditioning.  This will continue in the nursing home as well.   #4 - END-STAGE RENAL DISEASE:  The patient was noted to have some left upper  arm AV graft swelling.  CVTS was consulted.  There was a question as to  whether or not the patient was undergoing a steal syndrome.  However, CVTS did follow the next day and the patient did have  resolution of his symptoms  and a positive bruit and thrill to his graft.   On April 05, 2004, the patient was noted to have positive edema and a  decrease in breath sounds on physical exam.  The patient had also profound  weakness and continued diarrhea.  The patient underwent an abdominal CT with  the results above.  He had antibiotics as appropriate under hospital course  #1.  It was also noted that during dialysis the patient had a severe  hypotensive event and a loss of pulse briefly.  He did respond to 1.5 L of  normal saline at that time.  He did have an onset of new atrial fibrillation  with rapid ventricular rate, heart rate in the 140s.  At that time, the  patient received IV adenosine, as well as diltiazem.  The patient was  transferred to an ICU bed.  He also received placement of a nasogastric  tube.  The patient was started on a course of IV Avelox at that time for  questionable pneumonia.  The patient began to stabilize and did improve on  the broad spectrum antibiotics and was transferred back to the floor within  two days.  Hemodialysis has been successful with net ultrafiltrations  ranging between 2-3 L.  His blood pressures have remained systolically in  the 90s-140 range.  There was no further intervention.  Let it be noted that  he did have successful use of the left AV graft.   #5 - SECONDARY HYPERPARATHYROIDISM:  The patient did continue on his usual  outpatient regimen of Hectorol and Tums for phosphorus and calcium.  He was  noted to have calciums that did range between 6.8-7.7.  Phosphorus was noted  at a low of 1.6 on April 13, 2004.  Calcium carbonate was discontinued and  the patient will be instructed to drink milk 8 ounces b.i.d.  This will need  to continue in the nursing home.  There was no further intervention.   #6 - PERIPHERAL VASCULAR DISEASE:  As stated in the HPI, the patient has a  history of a right ischemic foot.  We did contact CVTS to  reevaluate this.  Dr. Darrick Penna did see the patient and stated that the right foot was similar to  the exam weeks ago.  It was stated that the patient is currently not a  bypass candidate for the right leg unless clinical considerations improve  and the patient becomes ambulatory.  There was no further intervention  noted.   DISCHARGE MEDICATIONS:  1.  Zoloft 50 mg p.o. daily.  2.  Discontinue Zocor.  3.  Protonix 40 mg p.o. q.a.c. and q.h.s.  4.  Aspirin 81 mg p.o. daily.  5.  Nephro-Vite one p.o. daily.  6.  Lactinex packet p.o. q.i.d.  7.  Vancomycin 250 mg p.o. q.i.d. for 10 days and then p.o. t.i.d. for a      total of one month.  8.  Questran 4 g p.o. b.i.d.  9.  Imodium 2 mg p.o. b.i.d. p.r.n.  10. Megace 200 mg p.o. daily.  11. Nepro supplement 240 mL p.o. t.i.d.  12. Milk 8 ounces p.o. b.i.d. x 1 day.   HEMODIALYSIS MEDICATIONS:  1.  Epogen 15,000 units IV with each hemodialysis treatment on Tuesdays,     Thursdays and Saturdays.  This is a new dosage.  2.  Hectorol 3 mcg IV with each hemodialysis treatment on Tuesdays,      Thursdays and Saturdays.  3.  InFeD 50 mg IV weekly.   DISPOSITION:  The patient will be discharged back to [facility].  He will have some changes in his medications as noted above to include a new  dose of vancomycin for her Clostridium difficile colitis.  He will also  start Megace as a regimen for malnutrition.  He will undergo  physical  therapy in the nursing home for considerable deconditioning.  He will need  to drink 8 ounces of milk two times a day tomorrow.   DIET:  The patient will remain on his usual diet as set forth by the  hemodialysis dietitian.   HEMODIALYSIS INSTRUCTIONS:  The patient will return to the [facility].  They are to note medication changes as stated above.  Hemodialysis  medications as stated above.  Please have the rounding attending review the  patient's phosphorus, as well as calcium.  The  patient's current binders are  on hold secondary to hypophosphatemia.  This will need to be reevaluated.  New dry weight recordings should be 74.0.  However, these  are hospital weights.  Please have the rounding M.D. evaluate after the  first treatment for any corrections made.  The patient will remain on tight  heparin.  Epogen dose is now 15,000.  Hectorol dose at 3 mcg IV with each  hemodialysis treatment.      MY/MEDQ  D:  04/16/2004  T:  04/16/2004  Job:  [id number]   cc:   [facility]. Fields, MD  [address], [place] [address]

## 2010-08-07 NOTE — Discharge Summary (Signed)
NAME:  Logan [name], Logan [name] NO.:  [id number]   MEDICAL RECORD NO.:  [id number]          PATIENT TYPE:  INP   LOCATION:  5505                         FACILITY:  [facility]   PHYSICIAN:  Janetta Hora. Fields, MD  DATE OF BIRTH:  April 08, 1919   DATE OF ADMISSION:  02/25/2004  DATE OF DISCHARGE:                                 DISCHARGE SUMMARY   ADDENDUM:  It was anticipated that the patient would be ready for discharge  home on March 07, 2004 following his hemodialysis treatment. However, on  this day it was determined by his family that he really was too weak to be  managed at home.  Both the patient and his wife were in agreement that short  term skilled nursing placement is the most appropriate level of care for the  patient at this time.  Placement efforts were underway over the weekend and  the patient's daughter's are looking at skilled nursing facilities today on  March 09, 2004.  When an appropriate facility has been arranged, the  patient will be discharged.  The only changes to the patient's medications  since last week is that he now to continue his Toprol XL 25 mg daily and is  to take that in the evening. He is also to continue Keflex 500 mg b.i.d.  through December 24.  All other instructions remain the same as per the  dictation on March 05, 2004.       CTK/MEDQ  D:  03/09/2004  T:  03/09/2004  Job:  [id number]   cc:   Rodolph Bong, M.D.  [address] [place], [place] [address]  Fax: [phone]   Garnetta Buddy, M.D.  [address]  [place]  [place] [address]  Fax: [phone]   [facility]

## 2010-08-07 NOTE — H&P (Signed)
NAME:  Logan [name], Logan [name] NO.:  [id number]   MEDICAL RECORD NO.:  [id number]          PATIENT TYPE:  INP   LOCATION:  2550                         FACILITY:  [facility]   PHYSICIAN:  Di Kindle. Edilia Bo, M.D.DATE OF BIRTH:  Aug 04, 1919   DATE OF ADMISSION:  09/08/2004  DATE OF DISCHARGE:                                HISTORY & PHYSICAL   REASON FOR ADMISSION:  Ischemic left lower extremity.   HISTORY:  This is a pleasant 77 year old gentleman who had undergone a left  femoral to posterior tibial artery bypass graft by Dr. Darrick Penna on March 02, 2004, for a nonhealing wound of the left foot. On his most recent follow-  up visit one day postoperative it showed an ABI of up to 100% from 28%. When  he was last seen his graft was last checked by Dr. Darrick Penna in March and the  graft had a good pulse. The patient stated that five or six days ago he  began noticing some pain and coolness in the left foot and then today he  noticed pain in the leg and the family noted that the left foot was mottled.  He came to the emergency room tonight with an ischemic left foot and no  Doppler flow in the left foot. Vascular surgery was asked to evaluate the  patient. He states that he has had rest pain in the left foot. He had some  mild pain in the right foot which has been stable.   PAST MEDICAL HISTORY:  1.  Chronic renal insufficiency and he dialyzes on Tuesday, Thursday, and      Saturday.  2.  Secondary hyperparathyroidism.  3.  History of anemia. He receives epoetin during dialysis.  4.  History of hypertension.  5.  History of transient ischemic attack in the past.  6.  History of basal cell carcinoma on the face, which has been removed.  7.  History of peritonitis, status post appendectomy.  8.  History of hernia repair.  9.  History of bradycardia.   FAMILY HISTORY:  There is no history of premature cardiovascular disease,  although his father died of a heart attack at an  older age.   SOCIAL HISTORY:  He is married and he has two children. He does not use  tobacco.   ALLERGIES:  No known drug allergies.   MEDICATIONS:  1.  ProMod 5 mg t.i.d. a.c.  2.  Vitamin B one p.o. daily.  3.  Protonix 40 mg p.o. b.i.d. a.c.  4.  Actinex one package b.i.d.  5.  Nephro-Vite supplement 240 mL p.o. t.i.d.  6.  Megace 200 mg p.o. daily.  7.  Imodium 2 mg p.o. b.i.d. p.r.n.  8.  Questran 4 mg p.o. b.i.d.  9.  Nephro-Vite one p.o. daily.  10. Aspirin 81 mg p.o. daily.  11. Zoloft 50 mg p.o. daily.  12. Quinine sulfate 325 mg p.o. q.12h.   PHYSICAL EXAMINATION:  VITAL SIGNS: Heart rate is 72, blood pressure 172/62.  The patient is afebrile.  HEENT: There is no cervical lymphadenopathy and no carotid bruits.  LUNGS: Clear bilaterally to auscultation.  CARDIAC: Regular rate and rhythm.  ABDOMEN: Soft and nontender.  EXTREMITIES: He has palpable femoral pulses and a palpable right popliteal  pulse. In the right foot he has a monophasic posterior tibial signal with a  Doppler. On the left side there is no palpable graft pulse. The left foot is  mottled with slightly decreased motor and sensory function. There is no  Doppler flow in the left foot.   IMPRESSION:  This patient presents with an occluded left femoral-posterior  tibial bypass graft. This is a composite PTFE vein graft. Apparently his  vein was quite small and not usable.  I have explained that without  revascularization he certainly would ultimately require primary amputation.  I think his best chance for limb salvage is attempted thrombectomy of his  graft. Of note he is not on Coumadin and we can consider placing him on  Coumadin to maintain graft patency if no correctable problems are found at  the time of surgery. I have discussed all this with his daughter, wife, and  the patient, and they are all agreeable to proceed urgently with attempted  thrombectomy of his graft.       CSD/MEDQ  D:   09/08/2004  T:  09/08/2004  Job:  [id number]

## 2010-08-07 NOTE — Discharge Summary (Signed)
NAME:  Logan [name], Logan [name] NO.:  [id number]   MEDICAL RECORD NO.:  [id number]          PATIENT TYPE:  INP   LOCATION:  5508                         FACILITY:  [facility]   PHYSICIAN:  Di Kindle. Edilia Bo, M.D.DATE OF BIRTH:  February 18, 1920   DATE OF ADMISSION:  12/25/2004  DATE OF DISCHARGE:                                 DISCHARGE SUMMARY   ADMISSION DIAGNOSIS:  Ischemic left lower extremity.   DISCHARGE DIAGNOSIS:  1.  End stage renal disease on chronic hemodialysis.  2.  Hypertension.  3.  Transient ischemic attack.  4.  Basal cell carcinoma.  5.  Anemia.  6.  Benign prostatic hypertrophy.  7.  Severe peripheral vascular disease.  8.  History of bradycardia status post Cardiolite by Dr. Reyes Ivan.  9.  History of Clostridium difficile colitis.  10. Tonsillectomy and adenoidectomy.  11. Status post left femoral to posterior tibial bypass with positive PTFE      vein status post thrombectomy x 2.  12. Ischemic left lower extremity status post thrombectomy and revision of      left femoral to posterior tibial bypass graft status post left above      knee amputation.   ALLERGIES:  No known drug allergies.   BRIEF HISTORY:  The patient is an 77 year old male who is a nursing home  resident who suddenly developed the onset of pain in the left foot at  approximately 2:30 a.m. on December 25, 2004.  He has a history of severe  peripheral vascular disease.  The patient is status post left femoral to  posterior tibial bypass above the PTFE vein in December 2005.  He  subsequently underwent thrombectomy of the left femoral to posterior tibial  bypass in June 2006 as well as in August 2006.  The patient presented to the  emergency room with a cold,  mottled left foot and no graft pulse.  He was evaluated by Dr. Edilia Bo who  admitted the patient at the time for emergent surgery and an attempt for  limb salvage.   HOSPITAL COURSE:  The patient was admitted via the emergency  room as  previously stated by Dr. Edilia Bo who then subsequently took the patient to  the OR on December 25, 2004, for thrombectomy of the left femoral to posterior  tibial bypass graft with revision of the distal portion with Gore-Tex graft.  Interoperative arteriograms were performed x 2.  The patient tolerated the  procedure well and was hemodynamically stable immediately postoperatively.  The patient was transferred from the OR to the post anesthesia care unit in  stable condition.  The patient was extubated without complications and woke  up from anesthesia neurologically intact.  The patient was in stable  condition in the post anesthesia care unit, however, his left lower  extremity bypass thrombosed again.  Secondary to this, the patient was  admitted to 3300 and subsequent amputation was scheduled.  The patient was  then returned to the OR on December 26, 2004, for a left above the knee  amputation.  Again, the patient tolerated the procedure well and was  hemodynamically stable immediately postoperatively.  The  patient was  transferred from the OR to the post anesthesia care unit in stable  condition.  The patient was extubated without complications and woke up from  anesthesia neurologically intact.  The patient's left above the knee  amputation is healing well at this time.  There are no right foot ulcers  present.  The patient has remained afebrile and has stable vital signs.   The renal service was consulted immediately after the first procedure to  follow the patient as he is on chronic hemodialysis.  The renal service did,  in fact, see the patient on October 6 and has followed him and maintained  his hemodialysis throughout the postoperative course.  Secondary to an  episode of accelerated junctional rhythm in the post anesthesia care unit  after the initial procedure, the cardiology service was also consulted and  they evaluated him, as well, on December 25, 2004.  Dr. Patty Sermons  recommended  that calcium channel blocker and digoxin be held, otherwise, he felt that  the patient was stable from a cardiology standpoint.  On postoperative day  three from amputation, the patient is afebrile with stable vital signs and  without complaint.  His AKA is healing well and he is stable from an end  stage renal disease standpoint, as well.  The patient is ready for discharge  back to the skilled nursing facility with continued rehab as able.   LABORATORY DATA:  CBC on December 29, 2004, hemoglobin 10.1, hematocrit 29.5,  white count 7.8, platelets 189.  BNP on December 29, 2004, sodium 131,  potassium 4.9, BUN 86, creatinine 12.8, glucose 103.  PTT on December 29, 2004, 36.  Phosphorous on December 29, 2004, 7.4, calcium 8.7.   CONDITION ON DISCHARGE:  Improved.   DISCHARGE MEDICATIONS:  1.  Protonix 40 mg b.i.d.  2.  Aranesp 60 mcg each week at hemodialysis.  3.  Tylox 1-2 p.o. q.4-6h. p.r.n. pain.  4.  Aspirin 81 mg daily.  5.  Thiamine 100 mg daily.  6.  Renal vitamin daily.  7.  Quinine sulfate 325 mg p.o. b.i.d.  8.  Megace 200 mg p.o. daily.  9.  Zoloft 50 mg daily.  10. Questran 4 mcg b.i.d.  11. Hectorol 1 mcg IV at hemodialysis.  12. Protein supplement 6 mg t.i.d.   DISCHARGE INSTRUCTIONS:  The patient should clean the incision daily with  soap and water.  If wound problems arise, CVTS office should be contacted at  [phone].  Activities:  As outlined by the rehab services, the patient  should continue three hours a day of therapy.  Follow up appointment with  Dr. Darrick Penna in three weeks, CVTS office will contact the patient with the  date and time of this appointment.      Pecola Leisure, PA      Di Kindle. Edilia Bo, M.D.  Electronically Signed    AY/MEDQ  D:  12/29/2004  T:  12/29/2004  Job:  [id number]

## 2010-08-07 NOTE — Op Note (Signed)
NAME:  Logan [name], Logan [name] NO.:  [id number]   MEDICAL RECORD NO.:  [id number]          PATIENT TYPE:  INP   LOCATION:  1826                         FACILITY:  [facility]   PHYSICIAN:  Di Kindle. Edilia Bo, M.D.DATE OF BIRTH:  05-26-1919   DATE OF PROCEDURE:  12/25/2004  DATE OF DISCHARGE:                                 OPERATIVE REPORT   PREOPERATIVE DIAGNOSIS:  Occluded left posterior tibial bypass graft.   POSTOPERATIVE DIAGNOSIS:  Occluded left posterior tibial bypass graft.   PROCEDURES:  Thrombectomy of left femoral-posterior tibial bypass graft with  revision of graft and intraoperative arteriogram x2.   SURGEON:  Di Kindle. Edilia Bo, M.D.   ASSISTANT:  Pecola Leisure, PA.   ANESTHESIA:  General.   INDICATIONS:  This is an 77 year old gentleman who had had a left and to  posterior tibial artery bypass graft with a composite PTFE vein graft by Dr.  Darrick Penna in December of 2005. He has undergone two previous thrombectomies of  this graft.  He presented with ischemic leg and subtherapeutic Coumadin. He  is taken urgently to the operating room for attempted thrombectomy.   TECHNIQUE:  The patient was taken to the operating room, received a general  anesthetic. The left lower extremity was prepped and draped in usual sterile  fashion. The incision over the graft in the mid calf was opened and the  graft tear dissected free. The graft was divided. The patient was  heparinized. Proximal thrombectomy was achieved using a #4 Fogarty catheter.  The arterial plug was retrieved. Excellent flow was established through the  graft in the graft was flushed with heparinized saline and clamped. Distal  thrombectomy was then performed. Backbleeding was established. The graft was  then sewn back end-to-end with continuous 6-0 Prolene suture. Intraoperative  arteriogram was obtained which showed poor flow into the posterior tibial  artery and narrowing in the vein in several  segments. I felt the only other  real option was to replace the narrowed vein segment with interposition 6 mm  PTFE graft. The incision at the distal anastomosis was opened and through  scar tissue, the distal anastomosis exposed. A tunnel was created between  the two incisions and a 6 mm graft tunneled between the two incisions.  Tourniquet was placed on the thigh. The leg was exsanguinated with an  Esmarch bandage. The tourniquet was inflated to 250 mmHg. Under tourniquet  control, a longitudinal arteriotomy was made over the hood of the distal  anastomosis. This was extended onto the posterior tibial artery. I then  spatulated the vein. The PTFE graft and then spatulated and sewn end-to-end  to the remnant of vein down extending onto the posterior tibial artery  distal to the previous anastomosis. At the completion, this anastomosis was  tied and the graft pulled to the appropriate length for anastomosis to the  old graft. The old graft was sewn to the new graft with continuous 6-0  Prolene suture.  At the completion, intraoperative arteriogram was obtained  which showed patent distal posterior tibial artery. Hemostasis was obtained  in the wounds  and heparin was partially reversed with protamine.  The wounds were closed  with deep layer of 3-0 Vicryl. The skin closed with 4-0 Vicryl. Sterile  dressing was applied. The patient tolerated the procedure well and was  transferred to the recovery room in satisfactory condition. All needle and  sponge counts were correct.      Di Kindle. Edilia Bo, M.D.  Electronically Signed     CSD/MEDQ  D:  12/25/2004  T:  12/25/2004  Job:  [id number]

## 2010-08-07 NOTE — H&P (Signed)
NAME:  Logan [name], Logan [name] NO.:  [id number]   MEDICAL RECORD NO.:  [id number]          PATIENT TYPE:  INP   LOCATION:  1826                         FACILITY:  [facility]   PHYSICIAN:  Di Kindle. Edilia Bo, M.D.DATE OF BIRTH:  1919-08-30   DATE OF ADMISSION:  12/25/2004  DATE OF DISCHARGE:                                HISTORY & PHYSICAL   REASON FOR ADMISSION:  Ischemic left leg.   HISTORY:  This is a pleasant 77 year old gentleman who had undergone a left  femoral to posterior tibial artery bypass graft by Dr. Janetta Hora. Fields in  December 2005.  This was for a non-healing wound of the left foot.  Of note,  at the time of his original procedure, the saphenous vein in both lower  extremities had been explored and was quite small.  Therefore he had a  composite PTFV graft with a short segment of vein distally.  Postoperative  ankle/brachial index was 100%.  He presented in June of 2006, with an  occluded graft and underwent a thrombectomy at that time.  At that time he  was noted to have some mild narrowing within the vein segment of the graft;  however, revising the graft with inter-position Gore-Tex down to a tiny  posterior tibial artery was not felt to be likely to be successful.  In  addition, at that time the patient had not been on Coumadin and he was  placed on Coumadin postoperatively after that procedure.   He presented again on November 13, 2004, with an ischemic left leg.  At that  time his INR was not therapeutic.  Again he was taken to the operating room  and underwent a thrombectomy of his graft.  Again the graft was not revised.  There really were not any better options other than replacing the vein  segment with Gore-Tex.  In addition, the posterior tibial artery was quite  diseased distally.  In addition, the Coumadin was sub-therapeutic and the  plan was to keep him therapeutic on his Coumadin.   He presents now again with the third time this graft  has occluded.  Of note,  he did have his INR checked two days ago, at which time it was 3.4 and it  was adjusted.   PAST MEDICAL HISTORY:  1.  Significant for chronic renal insufficiency.  He dialyzes on Tuesdays,      Thursdays and Saturdays.  2.  Secondary hyperparathyroidism.  3.  History of anemia.  4.  History of hypertension.  5.  History of transient ischemic attacks in the past.  6.  History of basal cell carcinoma of the face, which has been removed.  7.  History of peritonitis, status post appendectomy.  8.  History of hernia repair.  9.  History of bradycardia.  He denies any history of diabetes, hypercholesterolemia, previous history of  a myocardial infarction or history of congestive heart failure.   FAMILY HISTORY:  There is no history of premature cardiovascular disease.  His father died from a heart attack when he was elderly.   SOCIAL HISTORY:  He is married and has two children.  He does not use  tobacco.   ALLERGIES:  No known drug allergies.   MEDICATIONS:  1.  Aspirin 81 mg daily.  2.  Thiamine 100 mg daily.  3.  Nephro-Vite, one p.o. daily.  4.  Lactobacillus acidophilus [place] one packet b.i.d.  5.  Quinine sulfate 325 mg p.o. b.i.d.  6.  Megace 200 mg p.o. daily.  7.  Zoloft 50 mg p.o. daily.  8.  Questran 4 mg p.o. b.i.d.  9.  Hectorol 1 mcg on Tuesdays, Thursdays and Saturdays at dialysis.  10. Protein supplement powder 6 grams t.i.d.  11. Coumadin:  His dose on admission was 5 mg daily except for 6 mg on      Tuesdays and Thursdays.  12. Protonix 40 mg p.o. b.i.d.  13. Nephro, one can t.i.d.  14. Oxycodone 5 mg, one to two q.4h. p.r.n. pain.   REVIEW OF SYSTEMS:  GENERAL:  He has had no weight loss or weight gain or  problems with his appetite or fever.  CARDIAC:  He has had no chest pain,  chest pressure, palpitations or arrhythmias.  PULMONARY:  He has had no  bronchitis, asthma or wheezing recently.  GI:  He has had no recent change  in  his bowel habits.  He has no history of peptic ulcer disease.  GENITOURINARY:  He has had no dysuria.  He makes a little urine.  VASCULAR:  Prior to developing the ischemic leg last night, he has not had any  significant claudication or rest pain.  He has had no history of deep venous  thrombosis or phlebitis.  The review of systems is otherwise unremarkable.   PHYSICAL EXAMINATION:  VITAL SIGNS:  Temperature 98.6 degrees, blood  pressure 120/60, heart rate 72.  HEENT/NECK:  I did not detect any carotid bruits.  There is no cervical  lymphadenopathy.  LUNGS:  Clear bilaterally to auscultation.  HEART:  He has a regular rate and rhythm.  ABDOMEN:  Soft, nontender.  PULSES:  He has normal femoral pulses.  I cannot palpate pedal pulses on the  right side; however, the right foot is warm and well-perfused.  On the left  side the foot is cold and mottled with decreased motor and sensory function.  He has no palpable graft pulse on the left, whereas he previously had an  easily palpable graft pulse.   IMPRESSION/PLAN:  This patient presents with a third-time occlusion of his  left femoral-posterior tibial bypass graft.  His INR is pending.  Clearly  given the disadvantaged outflow and lack of adequate vein for an autogenous  bypass, his changes of limb salvage are quite small.  The options would be  to consider one more thrombectomy and graft and possibly revising the distal  graft by replacing the vein segment with PTFE; however, this is unlikely to  give a good long-term result.  The other option would be primary amputation.  His INR is pending.  We will make further recommendations, once his INR is  back and the family has had a chance to consider these options.      Di Kindle. Edilia Bo, M.D.  Electronically Signed     CSD/MEDQ  D:  12/25/2004  T:  12/25/2004  Job:  [id number]

## 2010-08-07 NOTE — Discharge Summary (Signed)
NAME:  Logan [name], Logan [name] NO.:  [id number]   MEDICAL RECORD NO.:  [id number]          PATIENT TYPE:  INP   LOCATION:  5509                         FACILITY:  [facility]   PHYSICIAN:  Donald Pore, MD       DATE OF BIRTH:  [birth year]   DATE OF ADMISSION:  04/29/2004  DATE OF DISCHARGE:  05/05/2004                                 DISCHARGE SUMMARY   DISCHARGE DIAGNOSES:  1.  End stage renal disease, hemodialysis dependent.  The patient attends      hemodialysis on Tuesday, Thursday, Saturday at [facility].  2.  History of C.difficile colitis on vancomycin p.o.  3.  History of anemia of chronic disease.  4.  History of malnutrition, failure to thrive.  5.  Secondary hyperparathyroidism.  6.  Peripheral vascular disease.  7.  Status post balloon angioplasty and outflow vein stenosis of a fistula      with venous stenosis just central to the venous anastomosis and multiple      collateral filling indicating hemodynamics significance.  8.  Status post lower extremity bypass graft, fem-tib.  9.  Status post bilateral cataract extraction.  10. Status post appendectomy.  11. History of TIA's.  12. Sacral decubitus.  13. Gangrenous toes.  14. History of bradycardia with Adenosine Cardiolite showing no significant      reversible defect and 2D echocardiogram showing an ejection fraction of      55%.   DISCHARGE MEDICATIONS:  1.  Afrin 81 mg p.o. daily.  2.  Questran 4 g p.o. b.i.d.  3.  Hectorol 2 mcg IV with hemodialysis.  4.  Epogen 20,000 units IV with hemodialysis.  5.  InFeD 50 mg IV q.7 days.  6.  Lactinex one packet p.o. b.i.d.  7.  Megace 200 mg p.o. daily.  8.  Nephro nutritional supplement 240 ml p.o. t.i.d.  9.  Nephro-Vite one p.o. daily.  10. Zoloft 50 mg p.o. daily.  11. Vancomycin 250 mg p.o. t.i.d.  12. Vitamin B-1 p.o. daily.  13. Vitamin C one p.o. daily.  14. Barrier Cream applied p.r.n.  15. Protonix 40 mg p.o. b.i.d. with  meals.  16. ProMod 5 g p.o. t.i.d. with meals.   HISTORY OF PRESENT ILLNESS:  Mr. Rampy was transferred to [facility] at the request of his family after a 2-3 day history of shortness  of breath with decreased O2 saturations in the range of 75 to 85%.   HOSPITAL COURSE:  #1 - SHORTNESS OF BREATH.  Shortness of breath improved  greatly with hemodialysis.  Estimated dry weight prior to admission was less  than 74 kg.  Prior to his initial treatment at [facility], Mr. Lucken  estimated dry weight was 71.5 kg, and he tolerated dialysis well to a post  treatment weight of 67.4 kg.  His dry weight needs to be adjusted at his  hemodialysis Center to reflect this.  His shortness of breath is likely due  to fluid overload, and he would benefit from a dry weight of 65 kg post  hemodialysis.  Chest x-ray  was concerning for bilateral infiltrates.  However, the patient was afebrile and his shortness of breath resolved with  two hemodialysis treatments.  He also did not have an elevated white count.  Thus, making it unlikely that he had a true pneumonia.  Upon initial  evaluation, Mr. Maland was started empirically on Zosyn for a possible  pneumonia.  However, this was discontinued after a few days of his inpatient  stay.   #2 - HISTORY OF C.DIFFICILE COLITIS.  Mr.  Gotay was continued on p.o.  Vancomycin during his stay.   #3 - ANEMIA OF CHRONIC DISEASE.  His dosage of Epogen was increased from  15,000 units IV q.hemodialysis to 20,000 units q.hemodialysis.  This needs  to be continued at the hemodialysis center [facility]).   #4 - MALNUTRITION, FAILURE TO THRIVE.  He was continued on Nepro nutritional  supplements t.i.d. and Megace during his hospital stay.  He was also  continued on ProMod at 5 mg p.o. t.i.d. with meals.   #5 - SECONDARY HYPERPARATHYROIDISM.  He was continued on Hectorol during his  stay.   #6 - PERIPHERAL VASCULAR DISEASE.  Sutures were removed from a  prior fem-tib  procedure during this stay.   #7 - SACRAL DECUBITUS.  __________ gangrenous toes.  Wound care consult was  obtained during his admission.  The patient was also placed on an air  mattress.  This air mattress has been ordered for the patient upon discharge  to [facility].   DISCHARGE PLAN:  Mr. Schellenberg is to follow up in his regularly scheduled  hemodialysis on Thursday, May 07, 2004, at the [facility].      HP/MEDQ  D:  05/05/2004  T:  05/05/2004  Job:  [id number]

## 2010-08-07 NOTE — H&P (Signed)
NAME:  Logan [name], Logan [name] NO.:  [id number]   MEDICAL RECORD NO.:  [id number]          PATIENT TYPE:  INP   LOCATION:                               FACILITY:  [facility]   PHYSICIAN:  Janetta Hora. Fields, MD  DATE OF BIRTH:  1919-07-27   DATE OF ADMISSION:  02/25/2004  DATE OF DISCHARGE:                                HISTORY & PHYSICAL   PHYSICIANS:  Primary care physician:  Quita Skye. Artis Flock, M.D.  Renal physician:  Garnetta Buddy, M.D.   CHIEF COMPLAINT:  Ischemic left foot.   HISTORY OF PRESENT ILLNESS:  This is an 77 year old male status post A-V  Gore-Tex graft placement of the left forearm and subsequent cellulitis.  The  patient was placed on Keflex, and this has continued to improved.  Re-  examination in the office revealed a persistent erythema, however, and this  was thought to be due to a GORE-TEX reaction.  The patient also has a  history of decreased ABIs in the left foot of 0.28 and0.49 on the right leg.  The patient does complain of intermittent rest pain of the left foot as well  as calf and foot pain.  He complained of a small ulcer on the left second  toe that has not healed.  His toes have been cyanotic for some time.  The  patient does have chronic renal insufficiency; however, he has not been  placed on dialysis yet.  Dr. Darrick Penna felt that the best treatment at the time  would be to proceed with an arteriogram to evaluate the left lower  extremity, and this has been scheduled for February 25, 2004.  The patient  will subsequently be admitted to [facility] for observation,  possible need for dialysis, as well as possible surgical revascularization  at a later time.  The patient does complain of tenderness to palpation of  all toes on both feet and some erythema.  He denies any edema, purulent  drainage, odor, shortness of breath, mental status changes, fever, chills,  angina, or palpitations.  There is a decreased temperature in the left foot  as  compared to the right.   PAST MEDICAL HISTORY:  1.  Hypertension.  2.  Secondary hyperparathyroidism.  3.  TIA.  4.  Chronic renal insufficiency.  5  Basal cell carcinoma.  1.  Peritonitis status post appendectomy.  2.  Claudication and peripheral vascular disease.   PAST SURGICAL HISTORY:  1.  Cataract removal bilaterally.  2.  Basal cell carcinoma removal from the face.  3.  Appendectomy.  4.  Hernia repair.   MEDICATIONS:  1.  Aspirin 81 mg daily.  2.  Plavix 75 mg daily which was stopped February 21, 2004.  3.  Lasix 40 mg daily.  4.  Toprol XL 50 mg daily.  5.  Keflex 500 mg b.i.d.  6.  Catapres patch TTS 2, 1 each Wednesday.  7.  Doxazosin  2 mg 1 q.h.s.  8.  Rocaltrol 0.25 mg Monday, Wednesday, and Friday.  9.  Nephro-Vite daily.  10. Zocor 20 mg daily.   ALLERGIES:  No known  drug allergies.   REVIEW OF SYSTEMS:  Please see HPI for significant positives and negatives.   FAMILY HISTORY:  Mother: CVA.  Father:  Heart disease.   SOCIAL HISTORY:  This is a married male with two children.  He is retired.  The patient denies alcohol use and quit smoking in the 1960s.   PHYSICAL EXAMINATION:  VITAL SIGNS: Blood pressure 150/88, pulse 68,  respirations 16.  GENERAL:  This is an 77 year old white male in no acute distress.  He is  alert and oriented x e.  HEENT:  Normocephalic and atraumatic.  Pupils are not equal, round, and  reactive to light and accommodation.  The patient is status post cataract  removal and lens placement.  Extraocular movements are intact.  There is no  evidence of glaucoma or macular degeneration.  NECK:  Supple with no JVD, no bruits, and no lymphadenopathy.  CHEST:  Symmetrical in inspiration.  LUNGS:  No wheezes, rhonchi, or rales.  CARDIAC:  Regular rate and rhythm with no murmurs, rubs, or gallops.  ABDOMEN: Soft and nontender.  There are bowel sounds auscultated in all four  quadrants.  The abdominal aorta is easily palpated; however, it  does not  feel enlarged.  There is also a bruit auscultated over the abdominal aorta.  GU/RECTAL:  Deferred.  EXTREMITIES:  No clubbing.  There is cyanosis of all the toes on both feet.  There is no edema present.  All of the toes are also very tender to  palpation.  There is an ulcer present between the third and fourth toes of  the left foot.  There is no purulence or evidence of infection.  There is  also erythema present. There are ulcers also present on the tips of the  third and fourth toes of the left foot.  They are erythematous with areas of  darkened tissue at the center.  There is no purulence or other drainage  noted.  The toes of both feet are mottled in appearance and cool to the  tough with decreased temperature greater in the left than on the right.  Temperature is noted to be decreased distal to the knee bilaterally.  PULSES:  Carotid and femoral pulses are 2+ bilaterally.  Popliteal 2+ on the  right, 1+ on the left.  Posterior tibial 2+ on the right.  Pedal pulses are  not palpable on the left.  The left forearm A-V Gore-Tex graft does have a  bruit and thrill.  Erythema is resolving.  It is nontender, and there is no  evidence of infection.  NEUROLOGIC:  Nonfocal.  Gait is steady.  The patient does walk with a cane.  Deep tendon reflexes are 2+, and muscle strength is 4/5.   IMPRESSION:  Ischemic left foot.   PLAN:  The patient will be admitted for an angiogram by Dr. Darrick Penna on  February 25, 2004.  The patient will be admitted after the angiogram  secondary to a possible need for hemodialysis as well as surgical  revascularization.  This will be determined after the angiogram on December  6.  Dr. Darrick Penna has seen and evaluated this patient prior to this admission  and has explained the risks and benefits of the procedure, and the patient  has agreed to continue.       ________________________________________  Pecola Leisure,  PA ___________________________________________  Janetta Hora. Fields, MD    AY/MEDQ  D:  02/24/2004  T:  02/24/2004  Job:  [id number]   cc:  Quita Skye Artis Flock, M.D.  [address]  [place]  [place] [address]  Fax: [phone]   Garnetta Buddy, M.D.  [address]  [place]  [place] [address]  Fax: [phone]

## 2010-08-07 NOTE — H&P (Signed)
NAME:  Logan [name], Logan [name] NO.:  [id number]   MEDICAL RECORD NO.:  [id number]          PATIENT TYPE:  INP   LOCATION:  5502                         FACILITY:  [facility]   PHYSICIAN:  Cecille Aver, M.D.DATE OF BIRTH:  Feb 11, 1920   DATE OF ADMISSION:  04/29/2004  DATE OF DISCHARGE:                                HISTORY & PHYSICAL   HISTORY OF PRESENT ILLNESS:  The patient resides at [facility]. He reports to the ED with a two-day history of shortness of breath  with nurses there reporting O2 saturations in the range of 75-85%.   PAST MEDICAL HISTORY:  1.  Shortness of breath x 3 days.  2.  History of C. difficile colitis on vancomycin orally.  3.  Anemia of chronic disease.  4.  Malnutrition/failure to thrive.  5.  End-stage renal disease on hemodialysis.  6.  Secondary hyperparathyroidism.  7.  Peripheral vascular disease.  8.  Status post balloon angioplasty of outflow vein stenosis of a fistula      with venous stenosis just central to the venous anastomosis and multiple      collateral filling indicating hemodynamic significance.  9.  Status post lower extremity bypass graft femoral-tibia.  10. Status post appendectomy.  11. Status post bilateral cataract extraction.  12. History of TIAs.  13. Sacral decubitus.  14. Gangrenous toes.  15. History of bradycardia with Adenosine-Cardiolite showing no significant      reversible defect and a 2-D echocardiogram with an ejection fraction of      55%.   ALLERGIES:  No known drug allergies.   MEDICATIONS:  1.  Aspirin 81 mg q.d.  2.  Questran 4 mg p.o. b.i.d.  3.  Hectorol 2 mcg IV with hemodialysis.  4.  Epogen 15,000 units IV with hemodialysis.  5.  InFeD 50 mg IV every 7 days.  6.  Lactinex 1 packet q.i.d.  7.  Megace 200 mg q.d.  8.  Nephro 240 mL p.o. t.i.d.  9.  Nephro-Vite 1 tablet p.o. q.24h.  10. Zoloft 50 mg p.o. q.d.  11. Vancomycin 250 mg p.o. t.i.d.  12. Vitamin B 1  p.o. q.d.  13. Vitamin C 1 p.o. q.d.  14. Nephro-Vite 1 p.o. q.d.  15. Barrier cream.   SOCIAL HISTORY:  The patient is a former smoker. He quit in the 1960s. He  does not drink any alcohol. He is lives in a skilled nursing facility. He  has family support. His wife and daughter are present on admission.   PHYSICAL EXAMINATION:  VITAL SIGNS:  Pulse 72 to 89, blood pressure 160 to  181/60 to 66, temperature 98.4, respiratory rate 22 to 24.  O2 saturation 86-  100% on room air to two liters.  GENERAL:  This patient is in no acute distress. Somnolent but arousable.  HEENT:  Eyes:  Pupils not equal, left smaller than right and not reactive to  light. Status post cataract surgery. ENT:  Moist mucosa.  NECK:  Supple without masses.  LUNGS:  Decreased breath sounds bilaterally at the bases.  CARDIOVASCULAR:  Regular rate and rhythm.  GASTROINTESTINAL:  Soft and nontender. Positive bowel sounds.  EXTREMITIES:  Edematous upper greater than lower with 2-3+ pitting edema and  gangrenous toes.   ASSESSMENT/PLAN:  1.  Shortness of breath.  2.  Elevated d-dimer in the emergency department.  3.  We will do a spiral CT scan to rule out pulmonary embolus. The patient      can handle dye load because he will be hemodialyzed tomorrow. A chest x-      ray showed more edema, effusion, and lower lobe volume loss. Cannot rule      out coexisting pneumonia. Questionable fluid overload, hemodialysis      tomorrow.  4.  Questionable pneumonia: We will start empiric antibiotic therapy with      Zosyn.  5.  End-stage renal disease, hemodialysis tomorrow.  6.  History of C. difficile colitis:  Continue vancomycin.  7.  History of anemia of chronic disease:  Continue Epogen and iron dextran.  8.  Secondary hyperparathyroidism:  Continue Hectorol.  9.  Decubitus foot ulcers and gangrenous toes:  Wound care consult and      Barrier cream.  10. Hyperlipidemia on Questran.  11. Failure to thrive:  Continue  Megace and nutritional supplementation.  12. Gastroesophageal reflux disease on Protonix.  13. Depression:  Continue Zoloft.  14. History of transient ischemic attack:  Continue 81 mg of p.o. aspirin      q.d.  15. Hypokalemia on admission:  K-Dur 20 mEq p.o. x 1 dose.  16. Hypertension:  Likely resolved with hemodialysis.      HP/MEDQ  D:  04/29/2004  T:  04/29/2004  Job:  [id number]

## 2010-08-07 NOTE — Op Note (Signed)
NAME:  Logan [name], Logan [name]          ACCOUNT NO.:  [id number]   MEDICAL RECORD NO.:  [id number]          PATIENT TYPE:  AMB   LOCATION:  SDS                          FACILITY:  [facility]   PHYSICIAN:  Larina Earthly, M.D.    DATE OF BIRTH:  22-May-1919   DATE OF PROCEDURE:  05/28/2006  DATE OF DISCHARGE:                               OPERATIVE REPORT   PREOPERATIVE DIAGNOSIS:  End stage renal disease with occluded left  forearm loop arteriovenous Gore-Tex graft.   POSTOPERATIVE DIAGNOSIS:  End stage renal disease with occluded left  forearm loop arteriovenous Gore-Tex graft.   PROCEDURE:  Thrombectomy and revision to higher brachial vein of left  forearm loop arteriovenous Gore-Tex graft.   SURGEON:  Larina Earthly, M.D.   ASSISTANT:  Nurse.   ANESTHESIA:  MAC.   COMPLICATIONS:  None.   DISPOSITION:  To recovery room stable.   PROCEDURE IN DETAIL:  The patient was taken to the operating room,  placed in supine position.  The area of the left arm prepped and draped  in the sterile fashion.  Using local anesthesia over the prior arterial  anastomosis and venous anastomosis, the graft vein was exposed.  The  graft was opened near the venous anastomosis and there was a tight  stenosis at the venous anastomosis and intimal hyperplasia and narrowing  of the vein for several centimeters above this.  A separate incision was  made slightly further up on the above elbow arm to expose the brachial  vein which was of good caliber.  The graft itself was thrombectomized.  The arterialized plug was removed and an excellent inflow was  encountered.  This was flushed with heparinized saline and reoccluded.  A interposition graft 7-mm Gore-Tex brought into the field, was  spatulated and sewn end to side to the vein with a running 6-0 Prolene  suture.  This was then tunneled down to the old anastomosis.  The old  venous anastomosis was oversewn and the graft was divided.  The new  graft was cut to  the appropriate length and sewn end to end to the old  graft with a running 6-0 Prolene suture.  Clamps were removed and  excellent turgor was noted.  The wound was irrigated with saline,  hemostasis with electrocautery.  The wound was closed with 3-0 Vicryl in  the subcutaneous and subcuticular tissue.  Benzoin and Steri-Strips were  applied.     Larina Earthly, M.D.  Electronically Signed    TFE/MEDQ  D:  05/28/2006  T:  05/28/2006  Job:  [id number]

## 2010-08-07 NOTE — Discharge Summary (Signed)
NAME:  Logan [name], Logan [name] NO.:  [id number]   MEDICAL RECORD NO.:  [id number]          PATIENT TYPE:  INP   LOCATION:  5505                         FACILITY:  [facility]   PHYSICIAN:  Janetta Hora. Fields, MD  DATE OF BIRTH:  February 09, 1920   DATE OF ADMISSION:  02/25/2004  DATE OF DISCHARGE:  03/05/2004                                 DISCHARGE SUMMARY   ADMISSION DIAGNOSIS:  Ischemic left foot with rest pain and tissue loss.   DISCHARGE SECONDARY DIAGNOSES:  1.  Peripheral vascular disease with ischemia of the left foot with rest      pain and tissue loss with bilateral severe tibial disease now status      post left femoral to posterior tibial bypass.  2.  Stage V chronic kidney disease started on hemodialysis on February 27, 2004.  3.  Secondary hyperparathyroidism.  4.  Anemia on Epoetin during dialysis.  5.  Hypertension, although noted to be hypotensive with hemodialysis.  6.  History of transient ischemic attack.  7.  History of basal cell carcinoma, status post removal from his face.  8.  History of peritonitis status post appendectomy.  9.  Status post bilateral cataract extraction.  10. History of hernia repair.  11. Bradycardia.   PROCEDURES:  1.  March 02, 2004, left femoral to posterior tibial bypass with      composite PTFE in vein, exploration of the right groin, intraoperative      arteriogram.  Surgeon, Janetta Hora. Fields, M.D.  2.  February 27, 2004, adenosine Cardiolite by Dr. Reyes Ivan.  Results showed no      significant reversible defect and is felt to be a low risk study.  3.  February 25, 2004, aortogram with bilateral lower extremity runoff by Dr.      Darrick Penna.  Findings showed severe tibial disease bilaterally.  4.  A 2-D echocardiogram  on February 25, 2004, showing left ventricular      ejection fraction estimated 55 to 60%.  5.  Ankle brachial indices with lower extremity Dopplers on March 03, 2004, showing ABI 0.46 on the right and  greater than 1.0 on the left.      There were dampened monophasic anterior tibial and posterior tibial      Doppler signals on the right and monophasic Doppler signals on the left.   HISTORY OF PRESENT ILLNESS:  Logan [name] is an 77 year old Caucasian  male who is status post arteriovenous Gore-Tex graft placement on left  forearm with subsequent cellulitis.  He was placed on Keflex and the  cellulitis continued to improve.  He also has known peripheral vascular  disease with claudication.  Previous ankle brachial indices in his left foot  were decreased at 0.28 and 0.49 in his right leg.  He also developed a small  ulcer on his left second toe which has not healed.  His left toes were also  mottled in appearance.  Dr. Darrick Penna felt that the best treatment to further  evaluate his peripheral vascular disease would be to proceed with an  arteriogram to  evaluate the left lower extremity.  This was scheduled for  February 25, 2004.  Prior to hospitalization the patient did report  tenderness to palpation of all his toes on both feet with some erythema.  He  denied some edema, purulent drainage, odor and fever.   As mentioned earlier, he does have an AV Gore-Tex graft for chronic renal  disease but, prior to admission, had not been started on hemodialysis.  The  renal service will be consulted during his hospitalization regarding further  management of this.   HOSPITAL COURSE:  On February 25, 2004, Logan [name] was electively  admitted to [facility].  He had undergone aortogram  with bilateral lower extremity runoff.  Results as discussed above.  Based  on the findings, it was felt that he would require left lower extremity  arterial bypass.  However, before preceding with surgery, Dr. Darrick Penna  consulted both nephrology and cardiology service for surgical clearance from  their standpoint.  He was evaluated first by cardiologist, Dr. Reyes Ivan and  scheduled for a 2-D  echocardiogram  which showed a normal left ventricular  ejection fraction estimated at 55 to 60%.  There were no ventricular wall  motion abnormalities.  He was also scheduled for an adenosine Cardiolite  test which was performed on February 27, 2004.  This ultimately was felt to  be low risk study and he was cleared from a cardiology standpoint.  He was  also seen by nephrologist, Dr. Lowell Guitar, who felt that hemodialysis should be  initiated.  Logan [name] underwent his first treatment on February 27, 2004.  Overall he tolerated this well, however, he was noted to be  hypotensive during his treatment.  Based on this, the nephrologist chose to  hold his Lasix, doxazosin and clonidine patch.  AT times he was also  bradycardic with a heart rate in the high 40s to high 50s.  Based on this,  the nephrologist also held his Toprol.  After these changes , he remained  hemodynamically stable with his heart rate ranging in the high 50s to low  70s.  On nonhemodialysis days, his blood pressure was ranging around 140/60.   After obtaining clearance by both nephrologist and cardiologist, Dr. Darrick Penna  scheduled Logan [name] for a left femoral to posterior tibial bypass on  March 02, 2004, scheduled.  Overall, he tolerated the procedure well and  after a short stay in the recovery unit was transferred to unit 3300,  stepdown unit, in stable condition.   On postoperative day #1, he remained stable.  His left foot was warm with  biphasic Doppler signals.  His left toes continue to show some mottling but  were felt to be stable and were becoming less painful.  His right foot also  remained stable.  His incisions were clean and dry without signs of  infection.  Postoperative ankle brachial indices also showed significant  increase on the left from 0.27 to greater than 1.0. His right ankle brachial  indices remained decreased at 0.46.  He was felt stable to transfer out to the floor.  Physical  therapy was ordered and he was seen on March 04, 2004. He was noted to have good tolerance to gait and was able to ambulate  with a rolling walker with minimal assistance.  Physical therapy felt that  he should progress well and recommended that if he continued to show good  progression that he should go home with home health physical  therapy as well  as a rolling walker and three-in-one commode.  This was arranged through  case management.  In regard to his other issues, he did continue to receive  routine scheduled hemodialysis treatments.  It was arranged for him to  continue hemodialysis at [facility] on Tuesday, Thursday,  Saturday at 11:30 a.m.  He was treated with Epoetin for postoperative  anemia.  Stool Hemoccults were also ordered, however, are still pending at  the time of dictation.  Postoperatively he remained hemodynamically stable  and relatively afebrile with only a few low grade temperatures around 100.  His pain was also controlled with oral medication.   On March 05, 2004, Logan [name] is scheduled for inpatient  hemodialysis.  If he continues to make good progress with mobilization, it  is anticipated that he will be able to go home later that same day.  Discharge orders will be written pending noticing changes in his status.   LABORATORY DATA:  Labs on March 04, 2004, showed white blood cell count  9.9, hemoglobin 8.0, hematocrit 23.2, platelet count 173.  Sodium 140,  potassium 3.8, blood glucose 124, BUN 25, creatinine 3.4, calcium 8.4.  Other recent labs show a ferritin of 661, iron 24, total iron binding  capacity 153 with 16% saturation. Phosphorus 4.2, albumin 3.3.  Parathyroid  hormone of 187.1.  Negative hepatitis B surface antigen.   DISCHARGE MEDICATIONS:  1.  Aspirin 81 mg one p.o. daily.  2.  Nephro-Vite one p.o. daily.  3.  Zocor 20 mg one p.o. daily.  4.  He is instructed to hold his Lasix, doxazosin, clonidine patch and       Toprol until further instructed by his family or kidney doctor.  5.  Tylox one or two tablets p.o. q.4h. p.r.n. pain.   ACTIVITY:  He is instructed to continue daily walking exercises as  tolerated.  Home health physical therapy has been arranged.   DIET:  He is to follow renal appropriate diet, as well as low fat and low  salt diet.   WOUND CARE:  May shower daily with soap and water.  He should clean his  incisions gently.  He should notify the [facility]TS office if he develops fever  greater than 101 or redness or drainage from his incision sites or for  increasing pain.   FOLLOW UP:  1.  He is to follow up at CVTS office on March 13, 2004, at 9 a.m. for      staple removal.  2.  He is to follow up with Dr. Darrick Penna at the CVTS office on Friday, April 03, 2004, at 8:30 a.m.  ABI testing will be done at this appointment.  3.  He is to continue hemodialysis at [facility] on Tuesday,      Thursday and Friday at 11:30 a.m.      AWZ/MEDQ  D:  03/04/2004  T:  03/04/2004  Job:  [id number]   cc:   Dr. Dorthula Perfect, [place]   Garnetta Buddy, M.D.  [address]  [place]  [place] [address]  Fax: [phone]   [facility]

## 2010-08-07 NOTE — Op Note (Signed)
NAME:  Logan [name], Logan [name] NO.:  [id number]   MEDICAL RECORD NO.:  [id number]          PATIENT TYPE:  OIB   LOCATION:  2899                         FACILITY:  [facility]   PHYSICIAN:  Janetta Hora. Fields, MD  DATE OF BIRTH:  [birth year]   DATE OF PROCEDURE:  01/22/2004  DATE OF DISCHARGE:                                 OPERATIVE REPORT   PREOPERATIVE DIAGNOSIS:  End-stage renal disease.   POSTOPERATIVE DIAGNOSIS:  End-stage renal disease   PROCEDURE:  Placement of left forearm AV graft.   SURGEON:   ASSISTANT:  Jerold Coombe, P.A.   ANESTHESIA:  Local with IV sedation.   FINDINGS:  1.  A 6 mg PTFE graft.  2.  A 4 mg basilic vein.   DESCRIPTION OF PROCEDURE:  After obtaining informed consent, the patient was  taken to the operating room.  The patient was placed in supine position on  the operating table.  After adequate sedation, the patient's left upper  extremity was prepped and draped in the usual sterile fashion.  Local  anesthesia was infiltrated just above the antecubital crease.  A  longitudinal incision was made in this location and carried down through the  subcutaneous tissues down to the level of the brachial artery.  This was  approximately 4 mm in diameter, dissected free circumferentially and  controlled proximally and distally with Vesi-loops.  Next, the basilic vein  was dissected free circumferentially adjacent to this.  There were two large  branches coming into a confluence which created a 4 mm basilic vein.  Next,  the side branches were dissected free and controlled with Vesi-loops.  A  subcutaneous tunnel was created in a loop configuration down the forearm  with transverse incision in the distal forearm for assistance in tunneling.  A 6 mm PTFE graft was then brought through the venous limb of the graft  which was on the ulnar aspect of the forearm was then beveled.  The vein  controlled with three bulldog clamps.  Longitudinal venotomy  was made.  The  graft was then sewn end of graft to side of vein using a running 6-0 Prolene  suture.  At completion of anastomosis, thoroughly flushed with heparinized  saline.  Next, attention was turned to the arterial limb of the graft.  Graft was pulled taught to length and slightly beveled.  A small arteriotomy  was made as far distal in the brachial artery as possible.  The graft was  then sewn end of graft to side of artery using a running 6-0 Prolene suture.  Just prior to completion anastomosis is forebled, back bled and thoroughly  flushed.  Clamps were then released.  The anastomosis secure.  There was  palpable thrill in the graft immediately.  Hemostasis was obtained with  thrombin and Gelfoam.  There was a weakly palpable radial pulse at the end  of the case.  The patient tolerated the procedure well and there were no  complications.  Hemostasis was obtained.  Subcutaneous tissue reapproximated  using running 3-0  Vicryl suture.  The skin was then closed with 4-0 Vicryl subcuticular  stitch.  Benzoin and Steri-Strips were applied.  The patient tolerated the  procedure well and there were no complications.  Sponge, needle and  instrument counts were correct at the end of the case.  The patient was  taken to the recovery room in stable condition.       CEF/MEDQ  D:  01/22/2004  T:  01/22/2004  Job:  [id number]   cc:   Garnetta Buddy, M.D.  [address]  [place]  [place] [address]  Fax: [phone]

## 2010-08-07 NOTE — H&P (Signed)
[place]  Patient:    Logan [name], Logan [name]                   MRN: [id number] Adm. Date:  [id number] Attending:  Ivor Messier                         History and Physical  CHIEF COMPLAINT:  This was a planned outpatient surgical admission of this 77 year old white male admitted for cataract implant surgery of the right eye.  HISTORY OF PRESENT ILLNESS:  This patient had had a cataract implant surgery performed by Dr. Hyman Hopes. Simel in June 1995 with complications of dislocated lens material and the placement of an anterior chamber implant. The patient later was referred to Dr. Pauline Aus Slusher at the [facility] for vitrectomy surgery to remove retained lens fragments.  The patient was first seen in my office in September 1996 with a vision of 20/200 right eye, 20/50 left eye with best correction.  Examination revealed a clear cornea, anterior chamber, intraocular lens implant.  There appeared to be possible revascularization or an angioma of the optic nerve.  The left eye showed a clear cornea, anterior chamber and slight corneal band keratopathy at the corneal limbus at the 9 and 3 oclock position.  The retina appeared normal. Subsequently over the years, the patient recently has had inability to read continuing with the right eye.  Examination on Jul 29, 1999 revealed acuity of 20/40 minus right eye, 20/50 left eye.  The patient was felt to have a moderate nuclear cataract which would require cataract removal for improvement of vision.  The patient was given oral discussion and printed information concerning the procedure and its complications. He signed an informed consent and arrangements were made for his outpatient admission at this time.  PAST MEDICAL HISTORY:  The patient is under the care of Dr. Artis Flock and is currently taking Cardura, Covera and one aspirin every other day.  The patient denies any specific  cardiorespiratory complaints.  PHYSICAL EXAMINATION:  VITAL SIGNS:  As recorded on admission blood pressure 187/89, temperature 98.5, pulse 57, respirations 18.  GENERAL APPEARANCE:  The patient is a pleasant, well-nourished, well-developed, white male in no acute ocular distress.  HEENT:  Eyes:  Visual acuity as noted above.  Applanation tonometry 14 mm right eye and 18 left eye.  Slit lamp examination:  The eyes are white and clear with a clear cornea.  The anterior chamber implant is present in the right eye with a peripheral iridectomy.  The left eye reveals nuclear cataract formation.  Fundus examination is essentially normal in both eyes at this time.  CHEST:  Lungs clear to percussion and auscultation.  HEART: Normal sinus rhythm.  No cardiomegaly.  No murmurs.  ABDOMEN:  Negative.  EXTREMITIES:  Negative.  ADMISSION DIAGNOSES: 1. Senile cataract, left eye. 2. Pseudophakia, right eye.  SURGICAL PLAN:  Planned extra capsular cataract extraction with primary insertion of posterior chamber intraocular lens implant, left eye. DD:  08/18/99 TD:  08/18/99 Job: [id number]

## 2010-08-07 NOTE — Op Note (Signed)
NAME:  Logan [name], Logan [name] NO.:  [id number]   MEDICAL RECORD NO.:  [id number]          PATIENT TYPE:  INP   LOCATION:  2550                         FACILITY:  [facility]   PHYSICIAN:  Di Kindle. Edilia Bo, M.D.DATE OF BIRTH:  November 20, 1919   DATE OF PROCEDURE:  DATE OF DISCHARGE:                                 OPERATIVE REPORT   PREOPERATIVE DIAGNOSIS:  Ischemic left leg with occluded left femoral-to-  posterior-tibial-artery bypass graft.   POSTOPERATIVE DIAGNOSIS:  Ischemic left leg with occluded left femoral-to-  posterior-tibial-artery bypass graft.   PROCEDURES:  Thrombectomy of left femoral-to-posterior-tibial-artery bypass  graft and intraoperative arteriogram.   SURGEON:  Di Kindle. Edilia Bo, M.D.   ASSISTANT:  Nurse.   ANESTHESIA:  Local with general.   TECHNIQUE:  The patient was taken to the operating room and received a  general anesthetic.  The left lower extremity was prepped and draped in the  usual sterile fashion.  The previous incision in the left groin was opened  and the proximal PTFE graft was dissected free and controlled with a  Vesseloop.  The graft was divided and after the patient was heparinized and  using a #4 Fogarty catheter, a proximal plug was retrieved and the arterial  anastomosis was inspected and widely patent.  Graft thrombectomy was then  performed using a #3 and a #4 Fogarty catheter until backbleeding was  established.  The graft was then flushed with heparinized saline and sewn  back end-to-end with continuous 6-0 Prolene suture.  I then shot an  intraoperative arteriogram which showed some retained clot in the distal  aspect of the PTFE portion of the composite bypass graft; I therefore  elected to explore this area.  A separate longitudinal incision was made  over this area and the distal PTFE and proximal vein portions were dissected  free and controlled Vesseloops.  A transverse graftotomy was made in the  PTFE and the  adherent clot was directly retrieved.  Of note, there was  irregularity within the vein portion of the graft; however, to replace this  with PTFE and sew it to a small tibial artery was not likely to result in  any better long-term outcome.  The posterior tibial artery was fairly small,  based on the intraoperative arteriogram.  The distal wound was closed with a  deep layer of 3-0 Vicryl and the skin closed with 4-0 Vicryl.  The groin  wound was closed with a deep layer of 2-0 Vicryl and the skin closed with 4-  0 Vicryl after a 15 Blake drain was placed.  The patient tolerated the  procedure well and was transferred to the recovery room in satisfactory  condition.  All needle and sponge counts were correct.       CSD/MEDQ  D:  09/09/2004  T:  09/09/2004  Job:  [id number]

## 2010-08-07 NOTE — Op Note (Signed)
NAME:  Logan [name], Logan [name] NO.:  [id number]   MEDICAL RECORD NO.:  [id number]          PATIENT TYPE:  INP   LOCATION:  5526                         FACILITY:  [facility]   PHYSICIAN:  Di Kindle. Edilia Bo, M.D.DATE OF BIRTH:  06-20-1919   DATE OF PROCEDURE:  11/13/2004  DATE OF DISCHARGE:                                 OPERATIVE REPORT   PREOPERATIVE DIAGNOSIS:  Ischemic left foot.   POSTOPERATIVE DIAGNOSIS:  Ischemic left foot.   PROCEDURE:  1.  Thrombectomy of left femoral posterior tibial bypass graft.  2.  Interoperative arteriogram.   SURGEON:  Di Kindle. Edilia Bo, M.D.   ASSISTANT:  Nurse.   ANESTHESIA:  General.   INDICATIONS FOR PROCEDURE:  This is an 77 year old gentleman who had  undergone a left femoral to distal posterior tibial artery bypass graft with  a composite PTFE vein graft on March 02, 2004, by Dr. Darrick Penna.  This had  occluded in June and I had performed a thrombectomy and we had placed him on  Coumadin postoperatively.  He presented again tonight with an occluded  graft.  His INR was 1.3.  He had a profoundly ischemic left foot.  He was  taken urgently to the operating room for thrombectomy of his graft.   SURGICAL TECHNIQUE:  The patient was taken to the operating room, received a  general anesthetic.  The left lower extremity was prepped and draped in the  usual sterile fashion.  After the skin incision was made over the PTFE  segment of the graft, the graft was dissected free and divided transversely.  The patient was heparinized.  I also made an incision over the posterior  tibial artery down by the ankle.  The Fogarty catheter was passed proximally  multiple times and a large amount of clot was retrieved.  The arterial plug  was retrieved and excellent flow established through the graft.  The graft  was flushed with heparinized saline and clamped.  Next, the distal  thrombectomy was performed.  Of note, the catheter pulled  through the artery  fairly nicely with no significant irregularity or narrowing noted.  I was  able to establish back bleeding.  The graft was then sewn back end-to-end  with continuous 6-0 Prolene suture.  An interoperative arteriogram was  obtained which showed the segment of vein was patent and although somewhat  small and had some mild, diffuse disease, it really looked very reasonable.  I thought that replacing this with PTFE graft into this very small posterior  tibial artery would not function as well as what he had currently and given  that his Coumadin was subtherapeutic I felt that the best chance in  maintaining patency of this graft was to simply keep his Coumadin at a  therapeutic range.  I elected not to reverse the heparin.  The distal incision was closed with interrupted 3-0 nylons.  The incision  over the graft was closed with a deep layer of 3-0 Vicryl and the skin was  closed with 4-0 Vicryl.  A dressing was applied.  The patient tolerated the  procedure well and had an excellent posterior  tibial signal with the Doppler  at the completion.      Di Kindle. Edilia Bo, M.D.  Electronically Signed     CSD/MEDQ  D:  11/13/2004  T:  11/13/2004  Job:  [id number]

## 2010-08-07 NOTE — Op Note (Signed)
NAME:  NIRVAAN, FRETT NO.:  [id number]   MEDICAL RECORD NO.:  [id number]          PATIENT TYPE:  INP   LOCATION:  3314                         FACILITY:  [facility]   PHYSICIAN:  Janetta Hora. Fields, MD  DATE OF BIRTH:  02-26-1920   DATE OF PROCEDURE:  12/26/2004  DATE OF DISCHARGE:                                 OPERATIVE REPORT   PROCEDURE:  Left above-knee amputation.   PREOPERATIVE DIAGNOSIS:  Ischemia, left leg.   POSTOPERATIVE DIAGNOSIS:  Ischemia, left leg.   ANESTHESIA:  General.   ASSISTANT:  Nurse.   OPERATIVE FINDINGS:  1.  Occluded tibial bypass graft.  2.  Poorly bleeding tissues.   OPERATIVE DETAILS:  After obtaining informed consent, the patient was taken  to the operating room.  The patient was placed in a supine position on the  operating table.  After induction of general anesthesia, the patient's left  lower extremity was prepped and draped in the usual sterile fashion.  A  circumferential incision was made just above the femoral condyle on the left  leg.  The incision was carried down through the subcutaneous tissues and  down to the level of the fascia.  The fascia was incised and the muscle  divided with cautery.  The left femorotibial Gore-Tex bypass graft was  dissected free circumferentially just below the level of the sartorius  muscle.  This was ligated and divided between silk ties.  The graft was  occluded.  Next, the superficial femoral artery and vein were dissected free  circumferentially.  The superficial femoral artery was occluded.  The  superficial femoral vein was patent.  These were ligated en masse with a 2-0  suture ligature.  The sciatic nerve was divided with cautery and allowed to  retract up in the leg.  Next, the femur was divided approximately 5 cm above  the skin incision line with a saw.  The wound was thoroughly irrigated with  normal saline solution.  The tissues had some bleeding, but this was not  vigorous.   The anterior edge of the femur was beveled with a rasp.  The  fascial edges were reapproximated with interrupted 2-0 Vicryl sutures.  The  subcutaneous tissue was reapproximated with a running 3-0 Vicryl suture.  The skin was closed with staples.  The patient tolerated the procedure well,  and there were no complications.  Instrument, sponge, needle counts were  correct at the end of the case.  The patient was taken to the recovery room  in stable condition.           ______________________________  Janetta Hora Fields, MD     CEF/MEDQ  D:  12/26/2004  T:  12/26/2004  Job:  [id number]

## 2010-08-07 NOTE — Discharge Summary (Signed)
NAME:  Logan [name], Logan [name] NO.:  [id number]   MEDICAL RECORD NO.:  [id number]          PATIENT TYPE:  INP   LOCATION:  5526                         FACILITY:  [facility]   PHYSICIAN:  Di Kindle. Edilia Bo, M.D.DATE OF BIRTH:  1920-01-07   DATE OF ADMISSION:  11/13/2004  DATE OF DISCHARGE:  11/17/2004                                 DISCHARGE SUMMARY   HISTORY OF PRESENT ILLNESS:  The patient is an 77 year old gentleman who has  undergone a previous left femoral-to-posterior-tibial-artery bypass by Dr.  Darrick Penna in December of 2005.  This was for a non-healing wound to the left  foot.  On his most recent followup visit after his surgery, he had an ankle-  brachial index of 100%, which was up from 28%.  Of note, the saphenous veins  in both lower extremities had been explored and were quite small and he  ended up having a Composite PTFE vein segment graft to the distal posterior  tibial artery.  He presented in June of 2006 with an occluded graft and  underwent a thrombectomy at that time.  At that time, he was noted to have  some narrowing within the vein segment of the graft; however, it was felt  that revising this with an interposition Gore-Tex all the way down to the  tiny posterior tibial would likely not result in long-term success.  In  addition, the patient had not been on Coumadin and it was felt that he could  potentially put him on Coumadin to assist with graft patency.  He underwent  thrombectomy of his graft in June and did well until the night prior to  admission, when he awoke approximately at midnight, noting that his left  foot was cold and painful.  He presented to the emergency department and was  noted to have an ischemic left foot.  Vascular Surgery consultation was  obtained for further recommendations.   PAST MEDICAL HISTORY:  1.  Chronic renal insufficiency.  He dialyzes Tuesdays, Thursdays and      Saturdays.  2.  Secondary hyperparathyroidism.  3.  History of anemia.  4.  History of hypertension.  5.  History of transient ischemic attacks in the past.  6.  History of basal cell carcinoma on the face which has been removed.  7.  History of peritonitis, status post appendectomy.  8.  History of hernia repair.  9.  History of bradycardia.  10. He denies any history of diabetes, hypercholesterolemia, previous      history of myocardial infarction or congestive heart failure.   ALLERGIES:  No known drug allergies.   MEDICATIONS PRIOR TO ADMISSION:  1.  ProMod 5 mg p.o. t.i.d. a.c.  2.  Vitamin B one p.o. daily.  3.  Protonix 40 mg p.o. b.i.d. a.c.  4.  Actinex one package b.i.d.  5.  Nephro-Vite one p.o. daily.  6.  Megace 200 mg daily.  7.  Imodium 7 mg b.i.d. p.r.n.  8.  Questran 4 mg b.i.d.  9.  Aspirin 81 mg daily.  10. Zoloft 50 mg daily.  11. Quinine sulfate 325 mg q.12 h.  12.  His Coumadin dose at home apparently was 7.5 mg Monday, Wednesday,      Friday and Sunday and 5 mg Tuesday, Thursday and Saturday.   FAMILY HISTORY:  Please see the history and physical done at the time of  admission.   SOCIAL HISTORY:  Please see the history and physical done at the time of  admission.   REVIEW OF SYSTEMS:  Please see the history and physical done at the time of  admission.   PHYSICAL EXAMINATION:  Please see the history and physical done at the time  of admission.   HOSPITAL COURSE:  The patient was admitted through the emergency room after  being evaluated by Dr. Edilia Bo.  The assessment at time of admission was  ischemic left foot secondary to occluded left femorotibial bypass.  It was  Dr. Adele Dan opinion that the only chance for limb salvage was attempted  thrombectomy of his graft and this was undertaken on November 13, 2004 with  the following procedure performed:  Thrombectomy of left femoral-posterior-  tibial bypass and intraoperative arteriogram.  The patient tolerated the  procedure well and was taken to  the postanesthesia care unit in stable  condition.   POSTOPERATIVE HOSPITAL COURSE:  The patient has done well medically.  He has  maintained stable hemodynamics.  He has been seen in continuous consultation  by the Renal Service and they have managed his medical regimen including his  dialysis therapies.  He is felt to be quite stable from this few point.  Additionally, the patient is doing well from a vascular point of view.  ABI  on the left is greater than 1.0 at this time.  He was placed on both heparin  and Coumadin, and now is therapeutic on his Coumadin with an INR on November 17, 2004 of 2.9.  His incisions are healing well.  He has tolerated a  gradual increase in activity commensurate for level of postoperative  convalescence.  Overall, his status is felt to be stable for transfer to the  nursing facility on today's date after review by the nephrologists to see if  they concur.   FINAL DIAGNOSES:  1.  Occluded left femoral and posterior tibial bypass, now status post      surgical thrombectomy with resultant ankle-brachial index of greater      than 1.  2.  End-stage renal disease.  3.  Hypertension.  4.  Anemia.  5.  Secondary hyperparathyroidism.  6.  History of transient ischemic attacks.  7.  History of basal cell cancer.  8.  History of severe peripheral vascular occlusive disease.  9.  History of Clostridium difficile colitis, January 2006.  10. History of methicillin-resistant Staphylococcus aureus and vancomycin-      resistant Enterococci wound infection of the right ankle, January 2006.  11. History of cataracts.   MEDICATIONS ON DISCHARGE:  1.  Aspirin 81 mg daily.  2.  Thiamine 100 mg daily.  3.  Renal formula vitamin one tablet daily.  4.  Lactobacillus/acidophilus [place] one packet p.o. b.i.d.  5.  Quinine sulfate 325 mg b.i.d.  6.  Megace 200 mg daily .  7.  Zoloft 50 mg daily.  8.  Questran 4 g p.o. b.i.d. 9.  Hectorol 1 mcg  Tuesday/Thursday/Saturday in dialysis.  10. Protein supplement -- 6 g of Beneprotein powder t.i.d.  11. Coumadin -- resume preadmission schedule.  12. Protonix 40 mg b.i.d.  13. Aranesp 60 mcg every Saturday in hemodialysis.  14. Nepro  vanilla one t.i.d.  15. Roxicodone 5 mg one to two q.4 h. p.r.n. as needed for pain or Ultram 50      mg to 100 mg q.12 h. p.r.n. for pain.   FOLLOWUP:  The patient should follow up with Dr. Adele Dan office in 2 week  post discharge; please arrange this at [phone], which is the [facility] office.  Additionally, sutures should be removed from the lower extremity on  November 24, 2004 by the nursing staff.  If there is any difficulty with  doing this, please call the [facility] office and have an appointment arranged to  have it done there.   The patient should continue with dialysis as scheduled.  INR should be  obtained through the dialysis center and at frequent intervals to maintain  INR greater than 2 for assistance with graft patency.   WOUND CARE:  The patient may shower, clean incisions gently with soap and  water.      Rowe Clack, P.A.-C.      Di Kindle. Edilia Bo, M.D.  Electronically Signed    WEG/MEDQ  D:  11/17/2004  T:  11/17/2004  Job:  [id number]   cc:   [facility]

## 2010-08-07 NOTE — Op Note (Signed)
NAME:  Logan [name], Logan [name] NO.:  [id number]   MEDICAL RECORD NO.:  [id number]          PATIENT TYPE:  INP   LOCATION:  3301                         FACILITY:  [facility]   PHYSICIAN:  Janetta Hora. Fields, MD  DATE OF BIRTH:  March 25, 1919   DATE OF PROCEDURE:  03/02/2004  DATE OF DISCHARGE:                                 OPERATIVE REPORT   PROCEDURE:  1.  Left femoral to posterior tibial bypass with Composite PTFE and vein.  2.  Exploration of right groin.  3.  Interoperative arteriogram.   PREOPERATIVE DIAGNOSIS:  Ischemia of the left foot with rest pain and tissue  loss.   POSTOPERATIVE DIAGNOSIS:  Ischemia of the left foot with rest pain and  tissue loss.   ANESTHESIA:  General.   SURGEON:  Charles E. Fields, MD   ASSISTANT:  Di Kindle. Edilia Bo, M.D.  Rowe Clack, P.A.-C.   FINDINGS:  1.  Poor quality right saphenous vein at the saphenofemoral junction.  2.  Poor quality left saphenous vein from mid thigh to mid calf.  3.  6 mm PTFE to non-reversed greater saphenous vein Composite graft.   PROCEDURE IN DETAIL:  After obtaining informed consent, the patient was  taken to the operating room.  The patient was placed in the supine position  on the operating table.  After induction of general anesthesia and  endotracheal intubation, a Foley catheter was placed.  The patient's lower  extremities were prepped and draped in the usual sterile fashion.  A  longitudinal incision was made over the left groin and carried down through  the subcutaneous tissues down to the level of the left common femoral  artery.  The common femoral artery was dissected free circumferentially.  The profunda femoris and superficial femoral arteries were also dissected  free.  Next, the saphenous vein was dissected free in the medial portion of  the leg.  The saphenofemoral junction was dissected free and side branches  ligated and divided between silk ties.  The saphenous vein was then  dissected free down the left leg using several skip incisions.  The patient  had had a preoperative vein mapping which showed that the saphenous vein was  approximately 17 mm in diameter starting in the mid thigh.  This was also  very similar in appearance on our dissection.  We continued to dissect the  saphenous vein down to the mid calf and there was no improvement in its  quality.  At this point, the vein was ligated distally and the vein  harvested with side branches and divided and ligated between silk ties.  The  vein was ligated at the saphenofemoral junction and transected.  There was  approximately 12 cm of good, usable vein for bypass.  This was placed in a  vein preservation solution on the back table.  Next, attention was turned  towards isolation of the posterior tibial artery.  This was dissected free  just posterior to the medial malleolus.  The vessel was approximately 1 to  1.5 mm in diameter but not heavily calcified.  Next, the patient was given  5000 units  of heparin.  The right groin was explored through a longitudinal  incision and carried down to the level of the saphenofemoral junction.  The  saphenofemoral junction was dissected free circumferentially, the vein at  this level was again only 17 mm in diameter and not useable.  Therefore,  this incision was reclosed in layers and the skin was closed with staples.  Next, attention was turned back to the left leg.  The patient was given 5000  units of intravenous  heparin.  The proximal common femoral artery was  controlled with a peripheral DeBakey clamp.  The profunda femoris and  superficial femoral arteries were controlled with vessel loops.  A  longitudinal arteriotomy was made and a 6 mm PTFE graft brought up on the  operative field and tunneled from the ankle incision to just above the  arterial exposure incision all the way up to the groin in a subcutaneous  position.  The graft was then beveled, an arteriotomy  was made in the common  femoral artery, and the graft was sewn end of graft to side of artery using  a running 6-0 Prolene suture.  Just prior to completion of the anastomosis,  this was forward bled, back bled, and thoroughly flushed.  The anatomosis  was secured, one additional repair stitch was placed.  There was good flow  through the femoral artery at this point.  The graft was clamped right at  the level of the anastomosis.  The graft was pulled taut to length and the  leg straightened.  The incision approximately 5 cm above the distal most  incision was an area that was suitable for grafting the good quality segment  of saphenous vein to the PTFE graft.  The vein was beveled and sewn end-to-  end to the graft using a running 6-0 Prolene suture.  At completion of the  anastomosis, the vein seemed slightly twisted.  Therefore, this was taken  down, the vein edges freshened, and then resewn and was of much better  quality at this point for an end-to-end anastomosis with no twisting.  There  was good flow down through the level of the graft to the level of the vein  at this point.  The valves in the vein were then lysed as it had been sewn  in a non-reversed position.  This was done for size reasons.  The graft was  then tunneled through one final skin bridge down to the ankle incision where  the posterior tibial artery had been isolated.  The tourniquet was then  placed at the mid calf and inflated to 300 mmHg.  The foot was exsanguinated  with an Esmarch prior to inflating the tourniquet.  Next, a longitudinal  arteriotomy was made in the posterior tibial artery.  One side branch was  back bleeding and this was controlled with a silk tie.  There was still some  back bleeding after elevation of the tourniquet so the artery was controlled  proximally and distally with fine bulldog clamps.  The vein was then  spatulated and sewn end of vein to side of artery using a running 7-0 Prolene  suture.  The artery was very small in diameter but minimally  calcified.  At the completion of the anastomosis, the tourniquet was  deflated, the graft was thoroughly flushed, everything was back bled and  forward bled.  The anastomosis was secured.  Two small repair stitches were  placed.  Hemostasis was obtained.  An interoperative arteriogram  was  obtained by placing a 21 gauge butterfly needle into the PTFE graft in the  mid portion of the leg.  This showed a widely patent vein to PTFE  anastomosis and a widely patent distal anastomosis with single vessel run  off via the posterior tibial artery into the foot.  There was some  retrograde filling of the posterior tibial artery and also filling of the  distal arch.  At this point, hemostasis was obtained in all incisions.  The  butterfly needle was removed and the hole repaired with a single 6-0 Prolene  stitch.  The subcutaneous tissues of all incisions in the leg were closed in  layers with Vicryl sutures.  The two lowest incisions on the left leg were  closed with interrupted nylon vertical mattress sutures on the skin level.  Doppler was used to inspect the signal in the leg at the end of the case and  this showed a biphasic posterior tibial Doppler signal distal to the  anastomosis which completely disappeared with compression of the graft.  At  this point, pressure dressings were applied.  The patient was extubated in  the operating room and taken to the recovery room in stable condition.       CEF/MEDQ  D:  03/02/2004  T:  03/02/2004  Job:  [id number]

## 2010-08-07 NOTE — Consult Note (Signed)
NAME:  WINTHROP, SHANNAHAN NO.:  [id number]   MEDICAL RECORD NO.:  [id number]          PATIENT TYPE:  INP   LOCATION:  3314                         FACILITY:  [facility]   PHYSICIAN:  Peter M. [place]il[place], M.D.  DATE OF BIRTH:  12-08-1919   DATE OF CONSULTATION:  12/25/2004  DATE OF DISCHARGE:                                   CONSULTATION   CONSULTING PHYSICIAN:  Peter M. [place], M.D.   Logan [name] is an 77 year old white male admitted for thrombosed left  femoral to PT bypass graft.  He underwent thrombectomy this morning.  During  surgery, he was noted to be in a junctional rhythm.  He did have some  bradycardia with heart rate down to 45-50 per his anesthesia records.  He  never became significantly hypotensive.  He is now in the PACU.  His left  fem PT graft has re-thrombosed.  His rhythm is now junctional which is  stable with a rate of 102.  Blood pressure is 122/66.  He complains of left  foot pain but denies any shortness of breath or chest pain.  His ECG is  unchanged from August 2006.  At that time, he was also noted to be in an  accelerated junctional rhythm.  It is anticipated that he will require left  leg amputation today.  The patient had been seen by Dr. Reyes Ivan in December  2005.  At that time he had an adenosine Cardiolite study which showed distal  anterior ischemia with normal ejection fraction of 65%.  He had an  echocardiogram which showed normal left ventricular function with mild  mitral annular calcification, mild left atrial enlargement.  He was treated  medically.  He has no known history of myocardial infarction or angina and  no history of congestive heart failure.   PAST MEDICAL HISTORY:  1.  Peripheral vascular disease, status post left fem to PT bypass graft in      December 2005.      1.  He is status post subsequent thrombectomy in June 2006, August 2006,          and again today.  This is now failed again.  2.  End-stage renal  disease on hemodialysis.  3.  Anemia.  4.  History of TIAs.  5.  BPH.  6.  Hypertension.  7.  History of C. difficile colitis.  8.  History of basal cell cancer.  9.  Pneumonia.  10. Secondary hyperparathyroidism.  11. History of cataracts.  12. Previous hernia repair.  13. Depression.   MEDICATIONS:  1.  Aspirin 81 mg per day.  2.  Thiamine 100 mg per day.  3.  Megace 200 mg per day.  4.  Zoloft 50 mg per day.  5.  Nephro-Vite daily.  6.  Protonix 40 mg b.i.d.  7.  Coumadin 4 mg per day.  8.  Questran one b.i.d.  9.  EPO 6,000 units with hemodialysis.   SOCIAL HISTORY:  The patient is a retired Engineer, site.  He is married.  Denies tobacco or alcohol use.   FAMILY HISTORY:  Positive for coronary disease  with both father and brother  having myocardial infarction in their 76s.   ALLERGIES:  He has no known allergies.   PHYSICAL EXAMINATION:  GENERAL:  The patient is an elderly white male alert  in moderate pain.  VITAL SIGNS:  Blood pressure 122/66, pulse is 102 and regular.  He is  afebrile.  HEENT:  Unremarkable.  He has no JVD or bruits.  LUNGS:  Clear.  CARDIAC:  Reveals a regular rate and rhythm without gallop, murmur, rub, or  click.  ABDOMEN:  Soft, nontender without masses.  EXTREMITIES:  His left leg is mottled and cool from his knee down with no  palpable distal pulses.   LABORATORY DATA:  ECG shows an accelerated junctional rhythm with a minor  nonspecific ST-T wave abnormalities.  CK is 123 with 9.3 MB, troponin 0.006.  White count is 5,000, hemoglobin 13.7, platelets 171,000.  Sodium 139,  potassium 4.6, chloride 98, CO2 27, BUN 56, creatinine 5.5, glucose of 86.   IMPRESSION:  1.  Accelerated junctional rhythm, possible bradycardia during surgery due      to increased __________  tone.  The patient is asymptomatic.  2.  Peripheral vascular disease with re-thrombosis of left femoral-PT graft      despite multiple surgeries.  3.  End-stage renal  disease.  4.  History of coronary artery disease by Cardiolite study with mild distal      anterior ischemia, asymptomatic.  5.  Hypertension.   PLAN:  1.  Would certainly hold any beta-blocker therapy at this point to avoid      bradycardia.  2.  It is anticipated that he will require a left leg amputation later      today.  3.  I do not see any cardiac reason why he can not proceed with surgery.  4.  Will monitor him closely with you.           ______________________________  Peter M. [place], M.D.     PMJ/MEDQ  D:  12/25/2004  T:  12/25/2004  Job:  [id number]   cc:   Larina Earthly, M.D.  [address]  [place]  [place] [address]   Elmore Guise., M.D.  Fax: [phone]   Garnetta Buddy, M.D.  Fax: [phone]   Karlene Einstein, M.D.  Fax: [phone]

## 2010-08-07 NOTE — Op Note (Signed)
[facility]  Patient:    Logan [name], Logan [name]                   MRN: [id number] Proc. Date: 08/18/99 Adm. Date:  [id number] Attending:  Ivor Messier                           Operative Report  PREOPERATIVE DIAGNOSIS:  Senile cataract, left eye.  POSTOPERATIVE DIAGNOSIS:  Senile cataract, left eye.  OPERATION:  Planned extra capsular cataract extraction with primary insertion of posterior chamber intraocular lens implant.  SURGEON:  Guadelupe Sabin, M.D.  ASSISTANT:  Nurse.  ANESTHESIA:  Local 4% xylocaine, 0.75% Marcaine retrobulbar block. Topical tetracaine, intraocular xylocaine.  DESCRIPTION OF PROCEDURE:  After the patient was prepped and draped, a speculum was inserted in the left eye.  The eye was turned downward and a superior rectus traction suture placed.  Schiotz tonometry was recorded at 8 scale units with a 5.5 gram weight.  A peritomy was performed adjacent to the limbus from 11 to 1 oclock position.  The corneoscleral junction was cleaned and a corneoscleral groove made with a 45 degree Superblade.  The anterior chamber was then entered with the 2.5 mm diamond blade at the 12:00 position and the 15 degree blade at the 2:30 position.  Using a bent 26 gauge needle on a Healon syringe, a circular capsulorrhexis was begun and then completed with the Graybo forceps.  Hydrodissection and hydrodelineation were performed using 1% xylocaine.  A 30 degree phacoemulsification tip was inserted.  Immediately beginning the emulsification, the lens dropped posteriorly indicating probable zonular laxity.  The bottle height was adjusted and phacoemulsification continued.  It was difficult to rotate the implant and there appeared to be very much adherence from the lens itself to the posterior capsule.  The iris incarcerated in the phacoemulsification tip inferiorly and a small peripheral iridectomy or dialysis was noted at the 6:00  position. Finally the nucleus was freed up with irrigation aspiration and continued gentle phacoemulsification. Total ultrasonic time two minutes and 42 seconds, average power level 15%, total amount of fluid used during the emulsification 775 cc.  The irrigation aspiration tip was used intermittently along with the additional Healon to separate the cortex and nucleus from each other and from the posterior capsule.  Finally the central pupillary aperture was cleared and only a small fragment remaining at the 12:00 position. It was felt that further manipulation would break the posterior capsule and make posterior chamber intraocular lens implantation impossible. It was therefore elected to proceed with the insertion of the intraocular lens implant.  An [employer] [id number] silicone three piece posterior chamber intraocular lens implant was then folded in the McDonald forceps and inserted into the anterior chamber, opened and then centered into the capsular bag using the [facility] lens rotator. Diopter strength was +12.50 in this myopic patient.  The lens appeared to center well.  The Healon which had been used was aspirated and Miochol ophthalmic solution irrigated in the anterior chamber.  The pupil; however, still remained slightly dilated and was extremely thin and ragged inferiorly.  The incision at the 12:00 position was sutured with two 10-0 interrupted nylon sutures.  Maxitrol ointment was instilled in the conjunctival cul-de-sac and a light patch and protector shield applied. Duration of procedure and anesthesia administration was 45 minutes.  The patient tolerated the procedure well in general and left the operating room for  the recovery room in good condition. DD:  08/18/99 TD:  08/18/99 Job: [id number]

## 2010-08-07 NOTE — Consult Note (Signed)
NAME:  Logan [name], Logan [name]          ACCOUNT NO.:  [id number]   MEDICAL RECORD NO.:  [id number]          PATIENT TYPE:  OIB   LOCATION:  5733                         FACILITY:  [facility]   PHYSICIAN:  Alvin C. Lowell Guitar, M.D.  DATE OF BIRTH:  [birth year]   DATE OF CONSULTATION:  02/25/2004  DATE OF DISCHARGE:                                   CONSULTATION   I was asked by Dr. Darrick Penna to see this 77 year old male with a history of  chronic kidney disease, stage 5, followed by Dr. Hyman Hopes at [facility], who was admitted for an arteriogram for peripheral vascular  disease.  A decision has been made by Dr. Hyman Hopes to initiate hemodialysis  during this hospitalization.  The patient has already received his  arteriogram today with intravenous contrast.  The patient is status post  left upper extremity AVG placement in November 2005.  The patient currently  is without nausea, vomiting, or shortness of breath.   PAST HISTORY:  Hypertension, peripheral vascular disease, status post left  upper extremity AVG, BPH, status post appendectomy, anemia, history of  cataracts, secondary hyperparathyroidism, history of TIA, history of basal  cell carcinoma.   CURRENT MEDICATIONS:  Aspirin, Plavix, Lasix, Toprol, doxazosin, Catapres.   SOCIAL HISTORY:  He is a retired Programmer, systems, a Psychologist, forensic.  He is  married.  Does not smoke or consume alcoholic beverages.   REVIEW OF SYSTEMS:  Remarkable for rest pain in his left lower foot,  worsened when he stands on the foot.   PHYSICAL EXAMINATION:  VITAL SIGNS:  Blood pressure is 129/57, heart rate is  50, afebrile.  GENERAL:  A pleasant elderly male looking much younger than his stated age.  HEENT:  Atraumatic, normocephalic.  Extraocular movements are intact.  CHEST:  Lungs clear.  CARDIAC:  Heart regular rhythm and rate.  ABDOMEN:  Soft.  EXTREMITIES:  Trace to no edema.  The left foot mid-toe is dusky and blue,  discolored.   Labs done on  December 2 reveal a hemoglobin of 10.4, creatinine 4.0, BUN 94,  albumin 4.1, phosphorus 3.7.   RENAL ASSESSMENT:  1.  Stage 5 chronic kidney disease.  2.  Severe peripheral vascular disease.  3.  Secondary hyperparathyroidism.  4.  Hypertension.  5.  Anemia.   RECOMMENDATIONS:  Per the primary nephrologist (Dr. Hyman Hopes), will initiate  hemodialysis to assist with acute medical management and long-term  management.      Alvi   ACP/MEDQ  D:  02/25/2004  T:  02/26/2004  Job:  [id number]

## 2010-08-07 NOTE — Discharge Summary (Signed)
NAME:  Logan [name], Logan [name] NO.:  [id number]   MEDICAL RECORD NO.:  [id number]          PATIENT TYPE:  INP   LOCATION:  5527                         FACILITY:  [facility]   PHYSICIAN:  Di Kindle. Edilia Bo, M.D.DATE OF BIRTH:  [birth year]   DATE OF ADMISSION:  09/08/2004  DATE OF DISCHARGE:                                 DISCHARGE SUMMARY   PRIMARY DIAGNOSIS:  Ischemic left leg with occluded left femoral to  posterior tibial artery bypass graft.   IN HOSPITAL DIAGNOSIS:  Left lower extremity cellulitis.   SECONDARY DIAGNOSES:  1.  Chronic renal insufficiency.  The patient dialyzed on Tuesday, Thursday,      Saturday.  2.  Secondary hyperparathyroidism.  3.  History of anemia.  He receives Epogen during dialysis.  4.  History of hypertension.  5.  History of transient ischemic attacks in the past.  6.  History of basal cell carcinoma on the face, which has been removed.  7.  Peritonitis, status post appendectomy.  8.  History of hernia repair.  9.  History of bradycardia.   ALLERGIES:  No known drug allergies.   IN HOSPITAL OPERATIONS AND PROCEDURES:  Thrombectomy of the left femoral to  posterior tibial artery bypass graft and intraoperative arteriogram.   HISTORY AND PHYSICAL AND HOSPITAL COURSE:  Mr. Sudduth is a pleasant 77-  year-old gentleman who had undergone a left femoral to posterior tibial  artery bypass graft by Dr. Darrick Penna on 03/02/04 for a nonhealing wound of the  left foot.  On his most recent followup visit one day postoperatively, it  showed an ABI of up to 100% from 28%.  When he was last seen, his graft was  last checked by Dr. Darrick Penna in March and the graft had a good pulse.  The  patient stated that five or six days ago, he began noticing some pain and  coolness in the left foot and then today, he noticed pain in the leg and the  family noted that the left foot was mottled.  He came to the emergency room  on June, 20, 2006 with an ischemic  left foot and no Doppler flow in the left  foot.  Vascular surgery was asked to evaluate the patient.  The patient  states that he has had rest pain in the left foot.  He has some mild pain in  the right foot, which has been stable.  The patient was seen and evaluated  by Dr. Edilia Bo.  Dr. Edilia Bo discussed with the patient undergoing  thrombectomy of his graft.  He discussed the risks and benefits of this  procedure.  The patient acknowledged understanding.  The patient was then  taken up to the operating room urgently.  For details of the patient's past  medical history and physical exam, please see dictated history and physical.   The patient was taken up to the operating room from the ER.  He underwent  thrombectomy of the left femoral to posterior tibial artery bypass graft  with intraoperative arteriogram.  The patient tolerated the procedure well.  On postoperative day #1, the patient was alert and oriented  x3.  Neurologic  was intact.  The graft was patent.  The left foot was warm and well  perfused.  The incisions are dry and intact.  The patient was started on  Coumadin and continued on heparin drip until therapeutic.  Renal was  consulted for management of hemodialysis.  The patient had postoperative  ABIs done on September 09, 2004 showing the right ABI indicating moderate  reduction in arterial flow, however, it is an increase postoperatively.  The  left ABI is abnormal Doppler wave form.  Noted ABI may be falsely elevated  due to calcified vessels.  No change since study of 03/03/04, however, it  did appear increased since ED visit on September 08, 2004.  On postoperative day  #2, the patient continued to progress well.  The graft remained patent.  J-P  drain was discontinued.  The incisions remained dry and intact.  Coumadin  was continued.  The patient was out of bed and ambulating.  Physical therapy  was consulted to work with the patient.  Postoperative day #3 noted around  the  patient's left lower extremity distal incision, there was some local  cellulitis and the patient was started on Ancef.  No drainage noted from the  incision site.  The patient was progressing well.  Physical therapy was  working with the patient.  H&H was stable at 9 and 27.6.  Coumadin was  continued.  On postoperative day #4, the wound was dry and intact.  There  was still some local cellulitis.  Antibiotics were continued.  INR was 1.6  and Coumadin continued.  Physical therapy continued to work with the  patient.  Postoperative day #5, the patient remained afebrile with vital  signs stable.  The graft remained patent.  The foot remained warm and well  perfused.  On postoperative day #6, the patient's white count elevated to  13.  He had been continued on the Ancef, this being day #3.  Remained local  cellulitis.  No drainage from incision.  The graft remained patent, the left  lower extremity warm and well perfused.  INR was slightly elevated at 3.3.  Coumadin was held.   The patient was transferred back to [facility], ambulating better,  white blood cell count within normal limits and the incision healing well.  A followup appointment will be scheduled with Dr. Edilia Bo for three weeks.  The office will contact [facility] to schedule an appointment.  The patient will continue dialysis Tuesday, Thursday, Saturday per renal.  The patient will continue physical therapy at [facility].  He is allowed to  shower, washing his incisions using soap and water.  Please contact the  office if he develops any drainage or opening from any of his incision  sites.  Diet to be renal diet.  The patient is not to do any heavy lifting  over 10 pounds.  Physical therapy to continue working with patient.   DISCHARGE MEDICATIONS:  1.  Keflex 500 mg p.o. q.i.d. x10 days.  2.  ProMod 5 mg p.o. t.i.d.  a.c.  3.  Vitamin B, 1 daily. 4.  Protonix 40 mg p.o. b.i.d. a.c.  5.  Nephro-Vite  supplement, 240 mL p.o. t.i.d.  6.  Megace 200 mg p.o. daily.  7.  Imodium 2 mg p.o. b.i.d. p.r.n.  8.  Questran 4 mg p.o. b.i.d. p.o. b.i.d.  9.  Aspirin 81 mg p.o. daily.  10. Zoloft 50 mg p.o. daily.  11. Quinine sulfate 325  mg p.o. b.i.d.  12. Coumadin 2 mg p.o. q.h.s.   The patient can have his PT/INR levels drawn in dialysis and physician at  [facility] can manage the patient's Coumadin dosing.     KMD/MEDQ  D:  09/14/2004  T:  09/14/2004  Job:  [id number]

## 2010-08-07 NOTE — Consult Note (Signed)
NAME:  Logan [name], Logan [name] NO.:  [id number]   MEDICAL RECORD NO.:  [id number]          PATIENT TYPE:  OIB   LOCATION:  5733                         FACILITY:  [facility]   PHYSICIAN:  Elmore Guise., M.D.DATE OF BIRTH:  1919-09-24   DATE OF CONSULTATION:  02/25/2004  DATE OF DISCHARGE:                                   CONSULTATION   REASON FOR CONSULT:  Cardiac evaluation prior to vascular surgery.   HISTORY OF PRESENT ILLNESS:  The patient is a very pleasant 77 year old  white male with past medical history of hypertension, recent TIA, chronic  renal insufficiency, peripheral vascular disease, and dyslipidemia, who was  admitted for elective lower extremity angiogram and evaluation of bilateral  claudication, pain, and poor wound healing of the left foot.  Patient in a  normal state of health, and he actually reports claudication for the last  two to three years.  However, now he states he has limited his ambulation,  walking from one room to the other.  He had a recent sore develop on his  left foot when he cut his toenails too short.  He was initially treated with  antibiotics.  Had slow wound healing.  Noninvasive duplex study was  performed of his lower extremities showing severe peripheral vascular  disease in the left with ankle-brachial index of 0.27 and in the right ankle-  brachial index of 0.49.  The patient also had recent evaluation for chronic  renal insufficiency.  He reports initial swelling in his lower extremities.  Routine blood work showed significant renal problems.  The patient was  referred to a nephrologist and placed on diuretics, and he reports an  approximately 20-pound weight loss since he has been on those diuretics.  He  also notes TIA in August 2005 with transient right-sided weakness which  totally resolved.  Prior to the last couple of months, he could ambulate a  couple blocks at a time.  Denied any significant chest pain or shortness  of  breath.  No palpitations, no orthopnea, no PND.  He did have a history of  lower extremity edema, however resolved with Lasix.   REVIEW OF SYSTEMS:  Positive for chronic cough with whitish phlegm.  Otherwise negative.   CURRENT MEDICATIONS:  1.  Aspirin 81 mg daily.  2.  Plavix 75 mg daily.  3.  Nephro-Vite.  4.  Lasix 40 mg daily.  5.  Toprol XL 50 mg daily.  6.  Catapres patch weekly.  7.  Zocor 20 mg daily.  8.  Doxazosin 2 mg daily.   ALLERGIES:  None.   FAMILY HISTORY:  Positive for coronary disease, with his father and brother  both having heart attacks in their 53s.   SOCIAL HISTORY:  He is married.  Retired Chartered loss adjuster.  No alcohol, no  tobacco.   EXAMINATION:  TEMPERATURE:  98.8.  HEART RATE:  Ranging 53-58.  BLOOD PRESSURE:  150/63.  SATURATION:  98% on room air.  GENERAL:  He is a very pleasant elderly white male, alert and oriented x 4,  in no acute distress.  NECK:  Supple.  No lymphadenopathy.  2+ carotids.  No JVD, no bruits.  LUNGS:  Clear.  HEART:  Regular, with a normal S1, S2, and a very soft S4 heard at the apex.  ABDOMEN:  Soft, nontender, nondistended.  EXTREMITIES:  Warm, with trace pedal pulses.  No significant edema.   RECENT LABORATORY RESULTS:  White count 8.8, hemoglobin 10.4, platelets 203.  BUN and creatinine 94 and 4.  Electrolytes and LFTs were normal.  Recent ECG  showed sinus bradycardia, rate of 46 per minute.  Normal axis.  No  significant ST or T wave changes.  He meets minimal voltage criteria for  LVH.   IMPRESSION:  1.  Cardiac evaluation prior to lower extremity vascular surgery.  2.  History of longstanding hypertension.  3.  Recent transient ischemic attack.  4.  Dyslipidemia.  5.  End-stage renal disease (BUN and creatinine 94 and 4).   PLAN:  From cardiovascular standpoint, patient at least at moderate to high  risk.  He has poor functional capacity at the current time secondary to his  claudication.  I would  recommend he continue perioperative beta blockers as  well as aspirin.  Okay to discontinued his Plavix.  However, he will need a  functional study prior to vascular surgery to evaluate for significant  coronary disease.  Schedule adenosine stress for the morning.  Further  recommendations pending these results.  Please call with any clinical  changes.       TWK/MEDQ  D:  02/25/2004  T:  02/25/2004  Job:  [id number]

## 2010-08-07 NOTE — H&P (Signed)
NAME:  Logan [name], Logan [name] NO.:  [id number]   MEDICAL RECORD NO.:  [id number]          PATIENT TYPE:  INP   LOCATION:  5526                         FACILITY:  [facility]   PHYSICIAN:  Di Kindle. Edilia Bo, M.D.DATE OF BIRTH:  Oct 19, 1919   DATE OF ADMISSION:  11/13/2004  DATE OF DISCHARGE:                                HISTORY & PHYSICAL   REASON FOR ADMISSION:  Ischemic left leg.   HISTORY:  This is a pleasant 77 year old gentleman who had undergone a left  femoral to posterior tibial artery bypass graft by Dr. Darrick Penna in December of  2005.  This was for a non-healing wound of the left foot.  On his most  recent follow up visit after his surgery, he had an ankle brachial index of  100%, which was up from 28%.  Of note, the saphenous vein in both lower  extremities had been explored and was quite small, and he ended up having a  composite PTFE vein segment graft to the distal posterior tibial artery.  He  had presented in June of 2006 with an occluded graft, and underwent  thrombectomy at that time.  At that time, he was noted the have some  narrowing within the vein segment of the graft; however, it was felt that  revising this with an interposition Gore-Tex all the way down to this tiny  posterior tibial artery would likely not result in long-term success.  In  addition, the patient had not been on Coumadin, and it was felt that we  could potentially put him on Coumadin to assist with graft patency.  He  underwent thrombectomy of his graft in June, and had well until last night  when he awoke approximately at midnight, noting that his left foot was cold  and painful.  He presented to the emergency department, and was noted to  have an ischemic left foot.  Vascular surgery was consulted for further  recommendations.   PAST MEDICAL HISTORY:  1.  Chronic renal insufficiency.  He dialyzes Tuesdays, Thursdays, and      Saturdays.  2.  Secondary hyperparathyroidism.  3.   History of anemia.  He receives epoetin during dialysis.  4.  History of hypertension.  5.  History of transient ischemic attacks in the past.  6.  History of basal cell carcinoma on the face, which has been removed.  7.  History of peritonitis, status post appendectomy.  8.  History of hernia repair.  9.  History of bradycardia.  10. He denies any history of diabetes, hypercholesterolemia, history of      previous myocardial infarction, or history of congestive heart failure.   FAMILY HISTORY:  There is no history of premature cardiovascular disease.  His father did die from a heart attack when he was elderly.   SOCIAL HISTORY:  He is married and has 2 children.  He does not use tobacco.   ALLERGIES:  No known drug allergies.   MEDICATIONS:  1.  ProMod 5 mg p.o. t.i.d. a.c.  2.  Vitamin B one p.o. daily.  3.  Protonix 40 mg p.o. b.i.d. a.c.  4.  Actinex one package b.i.d.  5.  Nephro-Vite one p.o. daily.  6.  Megace 200 mg p.o. daily.  7.  Imodium 2 mg p.o. b.i.d. p.r.n.  8.  Questran 4 mg p.o. b.i.d.  9.  Aspirin 81 mg daily.  10. Zoloft 50 mg p.o. daily.  11. Quinine sulfate 325 mg p.o. q.12h.   REVIEW OF SYSTEMS:  GENERAL:  He has had no weight loss, weight gain,  problems with appetite, or fever.  CARDIAC:  He has had no chest pain, chest  pressure, palpitations, or arrhythmias.  PULMONARY:  He has had no recent  bronchitis, asthma, or wheezing.  GI:  He has had no recent change in his  bowel habits, and has no history of peptic ulcer disease.  GU:  He has had  no dysuria or frequency.  VASCULAR:  He has ischemic rest pain in the left  foot.  He had been doing well before tonight, ambulating without difficulty.  He has had no previous history of DVT or phlebitis.  Review of systems is  otherwise unremarkable.   PHYSICAL EXAMINATION:  VITAL SIGNS:  Temperature is 98.8, blood pressure  144/65, heart rate 75.  HEENT:  There is no cervical lymphadenopathy.  I did not detect  any carotid  bruits.  LUNGS:  Clear bilaterally to auscultation.  CARDIAC:  He has a regular rate and rhythm.  ABDOMEN:  Soft and nontender.  EXTREMITIES:  He has palpable femoral pulses.  I cannot palpate pedal pulses  on either side.  The right foot, however, is warm and well perfused.  The  left foot is ischemic, mottled, with decreased motor and sensory function.  There is no pulse in his femoral-tibial bypass graft.   IMPRESSION:  This patient presents with an occluded femoral-tibial bypass  graft which has not occluded twice since it was placed in December.  His INR  is pending.  He had been on Coumadin to help maintain the patency of his  graft.  The main problem is that he did not have adequate vein for a vein  bypass.  The previous vein segment, which was part of this composite graft,  did show some evidence of degeneration when he was operated on 2 months ago.  It is likely that the only real option is probably to replace this segment  with interposition PTFE; however, given the small size of the run-off  vessel, there is only a small chance of this being successful.  He  understands that this is a limb-threatening situation, and he will  ultimately likely require more proximal amputation.  We will proceed  urgently with attempted thrombectomy of his graft and possible revision of  his graft.  I have answered the patient's questions, and his wife's  questions, and they are agreeable to proceed.      Di Kindle. Edilia Bo, M.D.  Electronically Signed     CSD/MEDQ  D:  11/13/2004  T:  11/13/2004  Job:  [id number]

## 2010-08-07 NOTE — H&P (Signed)
NAME:  Logan [name], Logan [name] NO.:  [id number]   MEDICAL RECORD NO.:  [id number]          PATIENT TYPE:  INP   LOCATION:  5530                         FACILITY:  [facility]   PHYSICIAN:  Maree Krabbe, M.D.DATE OF BIRTH:  [birth year]   DATE OF ADMISSION:  04/03/2004  DATE OF DISCHARGE:                                HISTORY & PHYSICAL   REASON FOR ADMISSION:  Lethargy and diarrhea.   HISTORY OF PRESENT ILLNESS:  This is an 77 year old white male with end-  stage renal disease suspected secondary to hypertension and renal vascular  disease, hospitalized February 24, 2004, through March 09, 2004, when he  underwent left lower extremity bypass graft. An arteriogram was required and  he already had stage IV chronic kidney disease. It was determined pre-  admission that he would start dialysis that hospitalization, which he did  via left arm AV Gore-Tex graft. His hospital course was essentially  uncomplicated and prolonged due to weakness and slow recovery. He ultimately  required discharge to a nursing home. The patient went home on Keflex 500 mg  b.i.d. for some mild cellulitis of the left lower extremity.   Since discharge the patient had been diagnosed with Clostridium difficile  colitis for which he completed a 10 to 14 day course of Flagyl. Overall, he  has become progressively weaker. He was seen in follow up by Dr. Darrick Penna on  March 27, 2004, in his office and felt the patient was in too poor  condition to  undergo right lower extremity bypass which he needs for his  ischemic right foot. This week he has developed lethargy, anorexia,  productive cough, abdominal bloating, and recurrence of his diarrhea. He  last dialyzed on the day prior to admission which time empiric oral  vancomycin was started at 125 mg q.i.d. Other diagnostic studies were  ordered including stool cultures, blood cultures, and chest x-rays, but  these results are unknown. Today, the wife and  daughter insist on bringing  him to the emergency room due to his profound weakness and lethargy. In the  emergency room his vital signs were stable, his white count is within normal  limits, his chest x-ray suggests possibly an infiltrate, and he is being  admitted to rule out sepsis, Clostridium difficile colitis, and receive  empiric antibiotics.   ALLERGIES:  No known drug allergies.   CURRENT MEDICATIONS:  1.  Lipitor 10 mg daily.  2.  Zoloft 50 mg daily.  3.  Pepcid 20 mg daily.  4.  Nephro-Vite one daily.  5.  Aspirin 81 mg daily.  6.  Vancomycin, day #2, 125 mg q.i.d.  7.  EPO 10,000 units IV each dialysis.  8.  InFeD 100 mg IV every Thursday.  9.  Hectorol 3.0 mcg IV each dialysis.   PAST MEDICAL HISTORY:  1.  End-stage renal disease suspect secondary to hypertension and renal      vascular disease, started dialysis December 2005 at the [facility] every Tuesday, Thursday, and Saturday via left arm AV Gore-Tex      graft.  2.  Secondary  hyperparathyroidism.  3.  Iron-deficiency anemia.  4.  Hypertension.  5.  Basal cell carcinoma of the face, status post removal.  6.  Peritonitis, status post appendectomy.  7.  Peripheral vascular disease, status post left femoral to posterior      tibial bypass on March 02, 2004.  8.  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  9.  History of TIAs.  10. Status post bilateral cataract extraction.  11. History of herniorrhaphy.  12. History of bradycardia in the past, seen by Dr. Reyes Ivan, with an      adenosine Cardiolite done on February 27, 2004, showing no significant      reversible defect and felt to be a low risk study, along with an      echocardiogram showing an ejection fraction estimated at 55% to 60%.   FAMILY HISTORY:  Positive for CVA, coronary artery disease, and  hypertension.   SOCIAL HISTORY:  He is married, but currently resides at a nursing home  since discharge on March 09, 2004.  He has two children. He is retired.  He is a remote smoker having quit in the 1960s and denied any alcohol use.   REVIEW OF SYSTEMS:  Positive for malaise, profound weakness, anorexia,  weight loss, productive cough, occasional nausea, recurrent diarrhea watery  and brown, abdominal bloating. Negative for fever, chills, chest pain,  hemoptysis, dysuria.   PHYSICAL EXAMINATION:  VITAL SIGNS: On admission blood pressure 150/50,  temperature 98.0, respirations 18, pulse 65, O2 saturation 95% on room air.  GENERAL: A pale, somnolent, with depressed affect white male who is in no  acute distress. He arouses easily. He is mostly coherent and is oriented  times two.  HEENT: Normocephalic and atraumatic.  Tympanic membranes and posterior  pharynx are clear.  NECK: Supple. No JVD.  LUNGS: A few crackles in the bases.  HEART: Regular rate and rhythm. No rub.  ABDOMEN: Positive bowel sounds, distended, but soft. Mild diffuse  tenderness, left greater than right side. No rebound or masses.  EXTREMITIES: Two toes on the left with dry gangrene. The left lower  extremity bypass wounds are well healed. The right foot is brightly ischemic  and mottled. 2+ edema in the right leg, 1+ edema in the left leg. The left  arm AV Gore-Tex graft is with bruit and unremarkable.   LABORATORY ON ADMISSION:  White count 9600, hemoglobin 11.9, platelet count  188,000. Sodium 141, potassium 3.6, chloride 98, CO2 30, BUN 52, creatinine  2.9, glucose 115, SGOT 60, SGPT 42, total bilirubin 0.6.  Urinalysis  positive for nitrites, small leukocyte esterase, 0-2 white cells, 3-6 red  blood cells.   Chest x-ray reveals questionable infiltrate.   ASSESSMENT/PLAN:  1.  Diarrhea, profound weakness, and lethargy. We plan to admit the patient,      rule out sepsis, rule out C. difficile, rule out urinary tract      infection, and administer empiric antibiotics. 2.  End-stage renal disease. Continue dialysis on  Tuesday, Thursday, and      Saturday.  3.  Peripheral vascular disease, status post left lower extremity bypass      with ischemic right foot. Dr. Darrick Penna has no intention of pursuing right      leg bypass at this point due to his poor overall condition.  4.  Iron-deficiency anemia. Continue EPO and InFeD.  5.  Secondary hyperparathyroidism. Continue vitamin D.  6.  Questionable infiltrate. Use empiric antibiotics.  7.  Depression.  Continue Zoloft.  8.  Mild liver function tests elevation, questionably secondary to statin      versus infection. Will hold Lipitor.      RRK/MEDQ  D:  04/03/2004  T:  04/04/2004  Job:  [id number]   cc:   Janetta Hora. Fields, MD  [address], [place] [address]   Pernell Dupre [facility]

## 2010-08-08 NOTE — Op Note (Signed)
  NAME:  REVERE, Logan [name] NO.:  [id number]  MEDICAL RECORD NO.:  [id number]           PATIENT TYPE:  O  LOCATION:  SDSC                         FACILITY:  [facility]  PHYSICIAN:  Di Kindle. Edilia Bo, M.D.DATE OF BIRTH:  12-03-1919  DATE OF PROCEDURE:  08/06/2010 DATE OF DISCHARGE:  08/06/2010                              OPERATIVE REPORT   PREOPERATIVE DIAGNOSIS:  Clotted right forearm arteriovenous graft.  POSTOPERATIVE DIAGNOSIS:  Clotted right forearm arteriovenous graft.  PROCEDURES: 1. Thrombectomy and revision of right forearm AV graft. 2. Intraoperative fistulogram x2.  SURGEON:  Di Kindle. Edilia Bo, MD  ASSISTANT:  Herbie Drape, RNFA  ANESTHESIA:  Local with sedation.  TECHNIQUE:  The patient was taken to the operating room, sedated by Anesthesia.  The right upper extremity was prepped and draped in usual sterile fashion.  After the skin was infiltrated with 1% lidocaine, incision above the antecubital level was opened where the venous limb of the graft was dissected free.  This was an end-to-side anastomosis.  The graft was divided proximal to the anastomosis after the patient was heparinized.  Graft thrombectomy was achieved using a #4 Fogarty catheter and the arterial plug was retrieved and excellent flow established to the graft which was flushed with heparinized saline and clamped.  The graft was somewhat redundant and a segment of the graft was excised.  Venous thrombectomy was then performed.  The venous anastomosis took a 4-1/2-mm dilator.  The shortened segment of graft was then sewn end-to-end to the old graft using continuous 6-0 Prolene suture.  At the completion, there was a good thrill in the graft.  Two intraoperative fistulogram was obtained which showed no problems with the venous outflow or with the graft or arterial anastomosis. Hemostasis was obtained in the wound and the wound was closed with deep layer of 3-0 Vicryl.  The skin  was closed with 4-0 Vicryl.  Sterile dressing was applied.  The patient tolerated the procedure well and was transferred to recovery room in stable condition.  All needle and sponge counts were correct.     Di Kindle. Edilia Bo, M.D.     CSD/MEDQ  D:  08/06/2010  T:  08/06/2010  Job:  [id number]  Electronically Signed by Waverly Ferrari M.D. on 08/08/2010 03:07:36 PM

## 2010-08-08 NOTE — Procedures (Unsigned)
CEPHALIC VEIN MAPPING  INDICATION:  Arteriovenous fistula placement.  HISTORY: Chronic kidney disease.  EXAM:  The right cephalic vein is compressible.   The cephalic vein was not visualized within the forearm.   Diameter measurements range from 0.30 to 0.19 cm from the antecubital fossa up into the proximal humerus.  The right basilic vein is compressible.   Diameter measurements range from 0.61 to 0.18 cm.  See attached worksheet for all measurements.  IMPRESSION:  Patent right cephalic and basilic veins with diameter measurements as described above.  ___________________________________________ Janetta Hora. Fields, MD  OD/MEDQ  D:  07/30/2010  T:  07/31/2010  Job:  [id number]

## 2010-08-11 NOTE — Op Note (Signed)
NAME:  Logan [name], Logan [name] NO.:  [id number]  MEDICAL RECORD NO.:  [id number]           PATIENT TYPE:  O  LOCATION:  SDSC                         FACILITY:  [facility]  PHYSICIAN:  Janetta Hora. Tesean Stump, MD  DATE OF BIRTH:  1919/08/16  DATE OF PROCEDURE:  08/04/2010 DATE OF DISCHARGE:  08/04/2010                              OPERATIVE REPORT   SURGEON:  Janetta Hora. Lakeasha Petion, MD  PROCEDURE:  Right forearm AV graft.  PREOPERATIVE DIAGNOSIS:  End-stage renal disease.  POSTOPERATIVE DIAGNOSIS:  End-stage renal disease.  ANESTHESIA:  Local with IV sedation.  OPERATIVE FINDINGS: 1. A 4-7 mm tapered PTFE graft. 2. Deep brachial vein.  OPERATIVE DETAILS:  After obtaining informed consent, the patient was taken to the operating room.  The patient was placed in supine position on the operating table.  After adequate sedation, the patient's entire right upper extremity was prepped and draped in usual sterile fashion. Local anesthesia was infiltrated near the antecubital crease.  A longitudinal incision was made in this location carried down through the subcutaneous tissues down to the level of the basilic vein.  The basilic vein was very fragile in appearance and small in diameter approximately 1.5 mm.  The adjacent brachial artery was dissected free circumferentially.  Small side branches were ligated and divided between the silk ties.  The adjacent deep brachial vein was of slightly better quality, it was still fairly thin walled, but of larger diameter.  The vein seemed to become of larger diameter as you moved up towards the shoulder.  Therefore, an additional local anesthesia was infiltrated near the bicipital groove just above the antecubital incision and incision was carried down through the subcutaneous tissues down to the level of the deep brachial vein.  The vein was approximately 3.5 mm in diameter at this location.  This was dissected free circumferentially and small  side branches were ligated and divided between the silk ties. Next, a subcutaneous tunnel was created down the forearm in a loop configuration and a 4-7 mm PTFE graft placed through this subcutaneous tunnel with a 4 mm end on the radial aspect of the forearm.  The patient was then given 5000 units of intravenous heparin.  Artery was controlled proximally and distally with vessel loops.  A longitudinal opening was made in the brachial artery and the graft beveled on the 4 mm and then sewn end of graft to side of artery using a running 7-0 Prolene suture. Just prior to completion of the anastomosis, this was forebled, backbled and thoroughly flushed.  Anastomosis was secured.  Clamps were released. There was a good pulsatile flow in the graft immediately.  Graft was pulled taut to length.  The vein was then controlled proximally and distally with fine bulldog clamps.  Longitudinal opening was made in the vein, the graft was beveled, cut to length and sewn end graft to side of vein using a running 6-0 Prolene suture.  Just prior to completion of the anastomosis, this was forebled, backbled and thoroughly flushed. Anastomosis was secured.  Clamps were released.  There was a palpable thrill above the graft immediately.  The patient was given 50  mg of protamine for reversal of heparin.  Hemostasis was obtained. Subcutaneous tissues of all three incisions were closed with a running 3- 0 Vicryl suture.  Skin of all three incisions were closed with a 4-0 Vicryl subcuticular stitch.  The patient tolerated the procedure well and there were no complications. Instrument, sponge, and needle counts were correct at the end of the case.  The patient was taken to the recovery room in stable condition.     Janetta Hora. Twala Collings, MD     CEF/MEDQ  D:  08/04/2010  T:  08/05/2010  Job:  [id number]  Electronically Signed by Fabienne Bruns MD on 08/11/2010 08:26:34 AM

## 2010-09-18 ENCOUNTER — Other Ambulatory Visit (HOSPITAL_COMMUNITY): Payer: Self-pay | Admitting: Nephrology

## 2010-09-18 DIAGNOSIS — N186 End stage renal disease: Secondary | ICD-10-CM

## 2010-09-22 ENCOUNTER — Ambulatory Visit (HOSPITAL_COMMUNITY)
Admission: RE | Admit: 2010-09-22 | Discharge: 2010-09-22 | Disposition: A | Discharge status: Home / Self Care | Payer: Medicare Other | Source: Ambulatory Visit | Attending: Nephrology | Admitting: Nephrology

## 2010-09-22 DIAGNOSIS — I120 Hypertensive chronic kidney disease with stage 5 chronic kidney disease or end stage renal disease: Secondary | ICD-10-CM | POA: Insufficient documentation

## 2010-09-22 DIAGNOSIS — D649 Anemia, unspecified: Secondary | ICD-10-CM | POA: Insufficient documentation

## 2010-09-22 DIAGNOSIS — I739 Peripheral vascular disease, unspecified: Secondary | ICD-10-CM | POA: Insufficient documentation

## 2010-09-22 DIAGNOSIS — Z4901 Encounter for fitting and adjustment of extracorporeal dialysis catheter: Secondary | ICD-10-CM | POA: Insufficient documentation

## 2010-09-22 DIAGNOSIS — N2581 Secondary hyperparathyroidism of renal origin: Secondary | ICD-10-CM | POA: Insufficient documentation

## 2010-09-22 DIAGNOSIS — N186 End stage renal disease: Secondary | ICD-10-CM | POA: Insufficient documentation

## 2010-09-25 ENCOUNTER — Other Ambulatory Visit (HOSPITAL_COMMUNITY): Payer: Self-pay | Admitting: Nephrology

## 2010-09-25 DIAGNOSIS — T8249XA Other complication of vascular dialysis catheter, initial encounter: Secondary | ICD-10-CM

## 2010-09-26 ENCOUNTER — Inpatient Hospital Stay (HOSPITAL_COMMUNITY)
Admission: AD | Admit: 2010-09-26 | Discharge: 2010-09-28 | DRG: 314 | Disposition: A | Discharge status: Nursing Home (Includes ICF) | Payer: Medicare Other | Source: Ambulatory Visit | Attending: Nephrology | Admitting: Nephrology

## 2010-09-26 ENCOUNTER — Ambulatory Visit (HOSPITAL_COMMUNITY)
Admission: RE | Admit: 2010-09-26 | Discharge: 2010-09-26 | Disposition: A | Discharge status: Home / Self Care | Payer: Medicare Other | Source: Ambulatory Visit | Attending: Nephrology | Admitting: Nephrology

## 2010-09-26 ENCOUNTER — Other Ambulatory Visit (HOSPITAL_COMMUNITY): Payer: Self-pay | Admitting: Nephrology

## 2010-09-26 DIAGNOSIS — S78119A Complete traumatic amputation at level between unspecified hip and knee, initial encounter: Secondary | ICD-10-CM

## 2010-09-26 DIAGNOSIS — I059 Rheumatic mitral valve disease, unspecified: Secondary | ICD-10-CM | POA: Diagnosis present

## 2010-09-26 DIAGNOSIS — E875 Hyperkalemia: Secondary | ICD-10-CM | POA: Diagnosis present

## 2010-09-26 DIAGNOSIS — N186 End stage renal disease: Secondary | ICD-10-CM | POA: Diagnosis present

## 2010-09-26 DIAGNOSIS — Y841 Kidney dialysis as the cause of abnormal reaction of the patient, or of later complication, without mention of misadventure at the time of the procedure: Secondary | ICD-10-CM | POA: Diagnosis present

## 2010-09-26 DIAGNOSIS — T827XXA Infection and inflammatory reaction due to other cardiac and vascular devices, implants and grafts, initial encounter: Secondary | ICD-10-CM | POA: Diagnosis present

## 2010-09-26 DIAGNOSIS — T82898A Other specified complication of vascular prosthetic devices, implants and grafts, initial encounter: Principal | ICD-10-CM | POA: Diagnosis present

## 2010-09-26 DIAGNOSIS — D649 Anemia, unspecified: Secondary | ICD-10-CM | POA: Diagnosis present

## 2010-09-26 DIAGNOSIS — I120 Hypertensive chronic kidney disease with stage 5 chronic kidney disease or end stage renal disease: Secondary | ICD-10-CM | POA: Diagnosis present

## 2010-09-26 DIAGNOSIS — Z8673 Personal history of transient ischemic attack (TIA), and cerebral infarction without residual deficits: Secondary | ICD-10-CM

## 2010-09-26 DIAGNOSIS — Z7982 Long term (current) use of aspirin: Secondary | ICD-10-CM

## 2010-09-26 DIAGNOSIS — J4489 Other specified chronic obstructive pulmonary disease: Secondary | ICD-10-CM | POA: Diagnosis present

## 2010-09-26 DIAGNOSIS — T8249XA Other complication of vascular dialysis catheter, initial encounter: Secondary | ICD-10-CM

## 2010-09-26 DIAGNOSIS — Z992 Dependence on renal dialysis: Secondary | ICD-10-CM

## 2010-09-26 DIAGNOSIS — N400 Enlarged prostate without lower urinary tract symptoms: Secondary | ICD-10-CM | POA: Diagnosis present

## 2010-09-26 DIAGNOSIS — N2581 Secondary hyperparathyroidism of renal origin: Secondary | ICD-10-CM | POA: Diagnosis present

## 2010-09-26 DIAGNOSIS — J449 Chronic obstructive pulmonary disease, unspecified: Secondary | ICD-10-CM | POA: Diagnosis present

## 2010-09-26 LAB — DIFFERENTIAL
Basophils Absolute: 0 10*3/uL (ref 0.0–0.1)
Basophils Relative: 0 % (ref 0–1)
Eosinophils Absolute: 0.6 10*3/uL (ref 0.0–0.7)
Eosinophils Relative: 6 % — ABNORMAL HIGH (ref 0–5)
Monocytes Absolute: 0.9 10*3/uL (ref 0.1–1.0)
Neutro Abs: 8.1 10*3/uL — ABNORMAL HIGH (ref 1.7–7.7)

## 2010-09-26 LAB — COMPREHENSIVE METABOLIC PANEL
Albumin: 3.4 g/dL — ABNORMAL LOW (ref 3.5–5.2)
BUN: 75 mg/dL — ABNORMAL HIGH (ref 6–23)
Creatinine, Ser: 8.31 mg/dL — ABNORMAL HIGH (ref 0.50–1.35)
Total Bilirubin: 0.2 mg/dL — ABNORMAL LOW (ref 0.3–1.2)
Total Protein: 6.3 g/dL (ref 6.0–8.3)

## 2010-09-26 LAB — POCT I-STAT 4, (NA,K, GLUC, HGB,HCT)
HCT: 36 % — ABNORMAL LOW (ref 39.0–52.0)
Hemoglobin: 12.2 g/dL — ABNORMAL LOW (ref 13.0–17.0)
Sodium: 136 mEq/L (ref 135–145)

## 2010-09-26 LAB — PROTIME-INR: Prothrombin Time: 13.3 seconds (ref 11.6–15.2)

## 2010-09-26 LAB — CBC
Hemoglobin: 11.3 g/dL — ABNORMAL LOW (ref 13.0–17.0)
MCHC: 33.2 g/dL (ref 30.0–36.0)
Platelets: 182 10*3/uL (ref 150–400)
RDW: 14.4 % (ref 11.5–15.5)

## 2010-09-26 MED ORDER — IOHEXOL 300 MG/ML  SOLN
100.0000 mL | Freq: Once | INTRAMUSCULAR | Status: AC | PRN
  Administered 2010-09-26: 50 mL via INTRAVENOUS

## 2010-09-27 ENCOUNTER — Inpatient Hospital Stay (HOSPITAL_COMMUNITY): Payer: Medicare Other

## 2010-09-27 DIAGNOSIS — N186 End stage renal disease: Secondary | ICD-10-CM

## 2010-09-27 HISTORY — PX: INSERTION OF DIALYSIS CATHETER: SHX1324

## 2010-09-27 LAB — SURGICAL PCR SCREEN
MRSA, PCR: NEGATIVE
Staphylococcus aureus: NEGATIVE

## 2010-09-28 ENCOUNTER — Inpatient Hospital Stay (HOSPITAL_COMMUNITY): Admit: 2010-09-28 | Discharge: 2010-09-28 | Disposition: A | Discharge status: Home / Self Care | Payer: Medicare Other

## 2010-09-28 LAB — RENAL FUNCTION PANEL
GFR calc Af Amer: 11 mL/min — ABNORMAL LOW (ref >60)
GFR calc non Af Amer: 9 mL/min — ABNORMAL LOW (ref >60)
Glucose, Bld: 92 mg/dL (ref 70–99)
Phosphorus: 4 mg/dL (ref 2.3–4.6)
Potassium: 4.3 mEq/L (ref 3.5–5.1)
Sodium: 138 mEq/L (ref 135–145)

## 2010-09-28 LAB — CBC
Hemoglobin: 10.4 g/dL — ABNORMAL LOW (ref 13.0–17.0)
MCHC: 33.3 g/dL (ref 30.0–36.0)
Platelets: 148 10*3/uL — ABNORMAL LOW (ref 150–400)
RBC: 3.36 MIL/uL — ABNORMAL LOW (ref 4.22–5.81)

## 2010-09-29 NOTE — Consult Note (Signed)
NAME:  Logan [name], Logan [name] NO.:  [id number]  MEDICAL RECORD NO.:  [id number]  LOCATION:  6709                         FACILITY:  [facility]  PHYSICIAN:  Di Kindle. Edilia Bo, M.D.DATE OF BIRTH:  1919-06-04  DATE OF CONSULTATION: DATE OF DISCHARGE:                                CONSULTATION   REASON FOR CONSULTATION:  Clotted right forearm AV graft with cellulitis in right forearm.  HISTORY:  This is a pleasant 77 year old gentleman with chronic kidney disease who has been on dialysis since approximately 2005.  He dialyzes at [facility] on Monday, Wednesdays, and Fridays.  He dialyzed at Wednesday without any problem.  He had had a right forearm AV graft placed on Aug 04, 2010, and then 2 days later had a thrombectomy of the graft.  Apparently, they were waiting for the graft to heal before using it, so he was being dialyzed via a Diatek catheter.  They began using his right forearm graft last week and it had been working well, so his catheter was removed on September 22, 2010.  He dialyzed Wednesday without any problem on September 23, 2010, but then on Friday went for dialysis and the graft was clotted.  Arrangements were made for him to have thrombolysis by the interventional radiologist this morning.  He underwent thrombolysis this morning which was successful with PTA of the venous anastomosis of an outflow vein; however, the graft clotted immediately. Of note, the patient was noted to have some swelling and cellulitis in the forearm prior to the procedure and given this, Radiology did not feel it was a candidate for repeat intervention.  Vascular Surgery was consulted.  Of note, gentleman is 77 and he denies any specific complaints.  He does not have any fever or chills.  He has not had any significant pain associated with the right arm.  PAST MEDICAL HISTORY:  Significant for: 1. Chronic kidney disease. 2. Hypertension. 3. History of TIA in 2005.  In addition, he  has a history of     hyperlipidemia, COPD, anemia of chronic disease, and peripheral     vascular disease.  PAST SURGICAL HISTORY:  He has had a previous left above-the-knee amputation.  FAMILY HISTORY:  His father died with an MI at age 21.  He also had an uncle who had a myocardial infarction.  He is unaware of any other history of premature cardiovascular disease.  SOCIAL HISTORY:  He is married.  He has two children.  He does not smoke cigarettes and has never smoked cigarettes.  REVIEW OF SYSTEMS:  GENERAL:  He has had no recent weight loss, weight gain, or problems with his appetite.  CARDIOVASCULAR:  He does have occasional chest tightness, which he states has been stable and he has not had any recently.  He does not describe any significant dyspnea on exertion, although his activity is very limited and he is pretty much in a wheelchair.  He has had no history of DVT that he is aware. Pulmonary, GI, GU, neurologic, musculoskeletal, psychiatric, ENT, hematologic review of systems is unremarkable as documented on his medical history form.  PHYSICAL EXAMINATION:  GENERAL: This is a pleasant 77 year old gentleman who appears his stated age.  VITAL SIGNS:  His temperature is 98.1, his heart rate is 67, his blood pressure of 156/94. HEENT:  Unremarkable. LUNGS:  Clear bilaterally to auscultation without rales, rhonchi, or wheezing. CARDIOVASCULAR:  I do not detect any carotid bruits.  He has a regular rate and rhythm.  He has palpable femoral pulses.  He has a healed left AKA.  He has no significant right lower extremity swelling. EXTREMITIES:  Reveals a clotted right forearm AV graft.  There is swelling in the arm with cellulitis in the forearm.  The incisions were intact with no drainage. MUSCULOSKELETAL:  He has had left AKA. NEUROLOGIC:  He has no focal weakness or paresthesias. SKIN:  He has the cellulitis in the right forearm, but no other ulcers or rashes. LYMPHATIC:   There is no significant cervical, axillary, or inguinal lymphadenopathy.  I have reviewed his venogram and fistulogram from today, which shows irregularity of the vein at the anastomosis and beyond the anastomosis in the arm, this graft extends fairly high up the upper arm.  The axillary vein and subclavian vein are patent.  His potassium is 4.7. Hemoglobin 12.2, hematocrit 36.  Most recent chest x-ray in April showed small bilateral pleural effusions, which were stable.  IMPRESSION:  This patient presents with a clotted right forearm arteriovenous graft and cellulitis in the right forearm.  Given that there was significant outflow disease and irregularity in the outflow vein on the right arm, revision of the graft would require extension very high up the arm.  Given that the graft clotted immediately after thrombolysis today and given the cellulitis as a further complicating factor, I do not think attempted thrombectomy of the graft has much chance for long-term success and is certainly complicated by the cellulitis.  I think the best option would be placement of a new graft in the upper arm once the cellulitis has resolved.  For this reason, we will have to have a Diatek catheter placed and I have discussed this with the family.  He has eaten today, so we will plan on placement of a Diatek catheter in the morning.  Once the cellulitis is resolved, we can arrange for a new right upper arm arteriovenous graft.  This could potentially be arranged as an outpatient as I suspect he will go home before he can have this placed.     Di Kindle. Edilia Bo, M.D.     CSD/MEDQ  D:  09/26/2010  T:  09/27/2010  Job:  [id number]  cc:   Maree Krabbe, M.D.  Electronically Signed by Waverly Ferrari M.D. on 09/29/2010 02:14:19 PM

## 2010-09-29 NOTE — Op Note (Signed)
  NAME:  Logan [name], Logan [name] NO.:  [id number]  MEDICAL RECORD NO.:  [id number]  LOCATION:  6709                         FACILITY:  [facility]  PHYSICIAN:  Di Kindle. Edilia Bo, M.D.DATE OF BIRTH:  [birth year]  DATE OF PROCEDURE:  09/27/2010 DATE OF DISCHARGE:                              OPERATIVE REPORT   PREOPERATIVE DIAGNOSIS:  Chronic kidney disease.  POSTOPERATIVE DIAGNOSIS:  Chronic kidney disease.  PROCEDURE:  Ultrasound-guided access of left IJ with placement of 27-cm Diatek catheter.  SURGEON:  Di Kindle. Edilia Bo, MD  ANESTHESIA:  Local with sedation.  TECHNIQUE:  The patient was taken to the operating room and sedated by Anesthesia.  The left neck and upper chest were prepped and draped in usual sterile fashion.  After the skin was anesthetized with 1% lidocaine and under ultrasound guidance the left IJ was cannulated and a guidewire introduced into the superior vena cava under fluoroscopic control.  Tract over the wire was dilated and then a dilator and peel- away sheath were advanced over the wire and the wire and dilator were removed.  A 23-cm catheter was advanced over the wire but was not long enough to get down into the right atrium.  I therefore changed this to a 27-cm catheter which I had to essentially hub in order to get it down around the turn of the brachiocephalic vein but was able to get it down into the superior vena cava.  The only longer catheter was a 55-cm catheter.  The exit site for the catheter was selected and the skin anesthetized between the two areas.  The catheter was then brought through the tunnel, cut to the appropriate length and distal ports were attached.  Both ports withdrew easily and then flushed with heparinized saline and filled with concentrated heparin.  Both ports were tested and withdrew easily.  The catheter was secured at its exit site with a 3-0 nylon suture.  The IJ cannulation site was closed with a  4-0 subcuticular stitch.  Sterile dressing was applied.  The patient tolerated the procedure well, was transferred to the recovery room in stable condition.  All needle and sponge counts were correct.     Di Kindle. Edilia Bo, M.D.     CSD/MEDQ  D:  09/27/2010  T:  09/27/2010  Job:  [id number]  Electronically Signed by Waverly Ferrari M.D. on 09/29/2010 02:14:24 PM

## 2010-10-03 LAB — CULTURE, BLOOD (ROUTINE X 2)
Culture  Setup Time: 201207080154
Culture: NO GROWTH
Culture: NO GROWTH

## 2010-10-09 ENCOUNTER — Encounter: Payer: Self-pay | Admitting: Vascular Surgery

## 2010-10-10 NOTE — Progress Notes (Deleted)
Subjective:     Patient ID: Logan [name], male   DOB: 1919/11/01, 77 y.o.   MRN: [id number]  HPI   Review of Systems     Objective:   Physical Exam     Assessment:     ***    Plan:     ***

## 2010-10-11 NOTE — Discharge Summary (Signed)
NAME:  Logan [name], Logan [name] NO.:  [id number]  MEDICAL RECORD NO.:  [id number]  LOCATION:  6709                         FACILITY:  [facility]  PHYSICIAN:  Logan [name], M.D.DATE OF BIRTH:  [birth year]  DATE OF ADMISSION:  09/26/2010 DATE OF DISCHARGE:  09/28/2010                              DISCHARGE SUMMARY   DISCHARGE DIAGNOSES: 1. Right lower extremity clotted arteriovenous Gore-Tex graft with     cellulitis. 2. End-stage renal disease. 3. Hypertension. 4. Peripheral vascular disease status post left above-the-knee     amputation. 5. Anemia. 6. Secondary hyperparathyroidism.  PROCEDURE:  Placement of left IJ tunneled dialysis catheter on September 27, 2010, Logan Kindle. Edilia Bo, MD  CONSULTS:  Logan Kindle. Edilia Bo, MD, was seen in Vascular Surgery.  HISTORY OF PRESENT ILLNESS:  Logan [name] is a delightful 77 year old white male with past medical history of end-stage renal disease, hypertension, and peripheral vascular disease who presented with a clotted right AV Gore-Tex graft and superficial cellulitis.  History is as follows; he had a right PermCath removed on September 22, 2010, hemodialysis on September 23, 2010, and clotted graft on September 24, 2100.  The graft was declotted by Interventional Radiology, but was informed by Dr. Fredia [name], that he had cellulitis and his graft site ultrasound did not reveal an abscess.  The patient was sent to hemodialysis and determined to be reclotted again.  He was directly admitted for IV antibiotics and anticipated surgical declot.  Prior to the admission, he had no pain in his arm, fevers, chills, nausea, vomiting, and the family did not know when the arm became red.  Please refer to the admission history and physical for further information.  Pre IR K was 4.7 7/7  Labs upon return to [facility] for admission later that afternoon labs showed white count of 10.8 with 75% neutrophils, 10% lymphocytes, 9% monocytes 6%  eosinophils. Hemoglobin 11.3, platelets 182,000, INR 0.99, potassium 4.8, BUN 75, creatinine 8.31, sodium 135, CO2 of 25, chloride 96.  Liver function tests within normal limits.  Albumin 3.4, calcium 11.3.  HOSPITAL COURSE: 1. Clotted right forearm AV Gore-Tex graft with cellulitis.  The     patient was admitted, empirically placed on vancomycin and fortaz.     Dr. Edilia [name] was consulted, and felt given the significant outflow     disease and a irregularity in the outflow vein on his right arm,     revision of the graft would require a extensive revision high up     into the arm and given the fact the graft clotted immediately after     thrombolysis and presence of cellulitis as a further complicating factor, he     did not think attempted thrombectomy had much chance for long-term     success.  Therefore, a plan was made for dialysis catheter to be     placed on September 27, 2010, which was done.  He continued on vancomycin     and fortaz with some improvement in cellulitis.  Blood cultures     were done on admission, but showed no growth at the time of     discharge.  The long-term plan is to continue with IV antibiotics  for another week and then followup in Logan [name] office in 2     weeks for further evaluation of next access which would probably be     a right upper arm graft. 2. End-stage renal disease.  The patient had dialysis treatments on     September 27, 2010, after catheter placement with a net UF of 3.4 L and     again on September 28, 2010, with a net UF of 700cc.  His post dialysis     weight on September 28, 2010, was 7.9.  His estimated dry weight was 65.5.     and will be lowered to 64.5 at the time of discharge. 3. Hypertension.  The patient's dry weight was lowered as above due to     his hypertension.  He will continue on his same medications after     discharge. 4. Peripheral vascular disease, no new issues. 5. Anemia.  Hemoglobin on admission was 11.3; decreased slightly to      10.4.  He received 6.25 mcg of IV Aranesp, the day of discharge and     Epogen will be resumed per protocol at his outpatient unit. 6. Secondary hyperparathyroidism.  Calcium on admission was elevated     11.3.  On the date of discharge, it was down to 9.9.  He will be     continued on his Zemplar dose at discharge.  He is also on Sensipar     for his hypercalcinemia and this is unchanged.  CONDITION ON DISCHARGE:  Improved.  DISCHARGE ORDERS:  He is to continue on one more week of vancomycin 750 mg IV every dialysis x3 and Fortaz 2 g IV every dialysis x3.  His dialysis center is to follow up for final blood cultures.  Estimated dry weight has been decreased to 64.5 kg.  Epogen is to be resumed per protocol. Zemplar dose is the same.  FOLLOWUP ORDERS:  Logan [name] office will call him for a followup appointment in 2 weeks for new access planning.  DISCHARGE DIET:  The patient discharged on a renal diet with limited fluids to 1200 mL per day.  ACTIVITY:  As tolerated.  He was instructed not to take showers with his new catheter and his Dialysis Center will do his dressing changes.  DISCHARGE MEDICATIONS: 1. As above plus 2. Enteric-coated aspirin 81 mg daily. 3. Norvasc 10 mg at bedtime. 4. Carvedilol 6.25 mg b.i.d. 5. Imdur 60 mg every a.m. 6. PhosLo 667 mg 2 tablets with meals 3 times a day. 7. Renal vitamin one daily. 8. Sensipar 30 mg every evening. 9. Simvastatin 10 mg every evening. 10.Soma 350 mg 1 tablet daily p.r.n.  Thirty-five minutes was spent completing patient's discharge summary and discharge paperwork.     Logan [name], P.A.C.   ______________________________ Logan [name], M.D.    MB/MEDQ  D:  09/28/2010  T:  09/29/2010  Job:  [id number]  cc:   Logan [name], M.D.       [facility]  Electronically Signed by Logan [name] P.A. on 09/29/2010 01:27:45 PM Electronically Signed by Delano Metz M.D. on 10/11/2010  98:11:91 PM

## 2010-10-14 ENCOUNTER — Encounter: Payer: Self-pay | Admitting: Vascular Surgery

## 2010-10-14 ENCOUNTER — Ambulatory Visit: Payer: Medicare Other | Admitting: Vascular Surgery

## 2010-10-14 ENCOUNTER — Ambulatory Visit (INDEPENDENT_AMBULATORY_CARE_PROVIDER_SITE_OTHER): Payer: Medicare Other | Admitting: Vascular Surgery

## 2010-10-14 VITALS — BP 151/55 | HR 63 | Temp 97.8°F

## 2010-10-14 DIAGNOSIS — N186 End stage renal disease: Secondary | ICD-10-CM

## 2010-10-14 NOTE — Progress Notes (Signed)
Subjective:     Patient ID: Logan [name], male   DOB: Oct 14, 1919, 77 y.o.   MRN: [id number]  HPI This patient comes in today to discuss new access. He has previously had grafts in his left forearm and left upper arm. He is previously had a right forearm AV graft. When his right forearm AV graft was placed, Dr. Leonette Most fields explored his basilic vein which was too small and friable to be used for a basilic vein transposition. Review of Systems He has had no chest pain or shortness of breath.    Objective:   Physical Exam He has a palpable right radial pulse.    Assessment:       I have recommended placement of a new right upper arm AV graft. His right upper arm cephalic vein and basilic vein had previously been explored and he was not a candidate for a fistula. Plan:     He is scheduled for placement of a new right upper arm AV graft on 10/20/2010

## 2010-10-14 NOTE — Progress Notes (Signed)
Discuss new access, ( Pt has HD on Monday, Wednesday and Friday via Left IJ cath placed on 09-27-10)  This patient returns for evaluation for new access. He has previously had a forearm and upper arm graft in the left arm. He has previously had a right forearm graft. When his right forearm graft was placed in may by Dr. Darrick Penna he explored the basilic vein and the vein was too small and fragile to be used for a basilic vein transposition.  He had a dietetic catheter placed on 09/27/2010. Of note, he dialyzes on Monday Wednesdays and Fridays. He is not on Coumadin.

## 2010-10-20 ENCOUNTER — Ambulatory Visit (HOSPITAL_COMMUNITY)
Admission: RE | Admit: 2010-10-20 | Discharge: 2010-10-20 | Disposition: A | Discharge status: Home / Self Care | Payer: Medicare Other | Source: Ambulatory Visit | Attending: Vascular Surgery | Admitting: Vascular Surgery

## 2010-10-20 DIAGNOSIS — Z01812 Encounter for preprocedural laboratory examination: Secondary | ICD-10-CM | POA: Insufficient documentation

## 2010-10-20 DIAGNOSIS — N185 Chronic kidney disease, stage 5: Secondary | ICD-10-CM | POA: Insufficient documentation

## 2010-10-20 DIAGNOSIS — Z8673 Personal history of transient ischemic attack (TIA), and cerebral infarction without residual deficits: Secondary | ICD-10-CM | POA: Insufficient documentation

## 2010-10-20 DIAGNOSIS — N186 End stage renal disease: Secondary | ICD-10-CM

## 2010-10-20 DIAGNOSIS — I739 Peripheral vascular disease, unspecified: Secondary | ICD-10-CM | POA: Insufficient documentation

## 2010-10-20 DIAGNOSIS — I120 Hypertensive chronic kidney disease with stage 5 chronic kidney disease or end stage renal disease: Secondary | ICD-10-CM | POA: Insufficient documentation

## 2010-10-20 LAB — POCT I-STAT 4, (NA,K, GLUC, HGB,HCT)
Hemoglobin: 12.9 g/dL — ABNORMAL LOW (ref 13.0–17.0)
Potassium: 4.2 mEq/L (ref 3.5–5.1)
Sodium: 139 mEq/L (ref 135–145)

## 2010-10-28 NOTE — Op Note (Signed)
  NAME:  Logan [name], Logan [name] NO.:  [id number]  MEDICAL RECORD NO.:  [id number]  LOCATION:  SDSC                         FACILITY:  [facility]  PHYSICIAN:  Di Kindle. Edilia Bo, M.D.DATE OF BIRTH:  Oct 23, 1919  DATE OF PROCEDURE:  10/20/2010 DATE OF DISCHARGE:                              OPERATIVE REPORT   PREOPERATIVE DIAGNOSIS:  Chronic kidney disease, stage V.  POSTOPERATIVE DIAGNOSIS:  Chronic kidney disease, stage V.  PROCEDURE:  Placement of new right upper arm AV graft.  SURGEON:  Di Kindle. Edilia Bo, MD  ASSISTANT:  Della Goo, PA-C  ANESTHESIA:  Local with sedation.  FINDINGS:  The artery was reasonable size 4 mm with a good pulse.  The brachial vein was 5 mm.  TECHNIQUE:  The patient was taken to the operating room and sedated by Anesthesia.  The right upper extremity was prepped and draped in usual sterile fashion.  After the skin was anesthetized with 1% lidocaine, an oblique incision was made just above the antecubital space and here the brachial artery was dissected free.  The segment of an old graft was excised.  A separate longitudinal incision was made beneath the axilla after the skin was anesthetized.  Here, the high brachial vein was dissected free.  It was ligated distally and then irrigated up nicely with heparinized saline.  I took a 5-mm dilator and it was clamped.  A tunnel was created between the 2 incisions and a 4- to 7-mm graft tunneled between the 2 incisions.  The patient was then heparinized. Brachial artery was clamped proximally and distally, and a longitudinal arteriotomy was made.  A segment of 4 mm of the graft excise, graft spatulated and sewn end-to-side to the artery using continuous 6-0 Prolene suture.  Next, the graft was cut to the appropriate length for anastomosis of the high brachial vein.  The graft was cut to appropriate length, spatulated and sewn end to the high axillary vein using continuous 6-0 Prolene  suture.  At the completion, there was an excellent thrill in the fistula and a good radial and ulnar signal with a Doppler.  Heparin was partially reversed with protamine.  The wound was closed with deep layer of 3-0 Vicryl.  The skin closed with 4-0 Vicryl.  A sterile dressing was applied.  The patient tolerated the procedure well, was transferred to recovery room in stable condition. All needle and sponge counts were correct.    Di Kindle. Edilia Bo, M.D.    CSD/MEDQ  D:  10/20/2010  T:  10/21/2010  Job:  [id number]  Electronically Signed by Waverly Ferrari M.D. on 10/28/2010 09:31:18 AM

## 2010-11-19 ENCOUNTER — Other Ambulatory Visit: Payer: Self-pay | Admitting: Dermatology

## 2010-11-24 ENCOUNTER — Other Ambulatory Visit (HOSPITAL_COMMUNITY): Payer: Self-pay | Admitting: Nephrology

## 2010-11-24 DIAGNOSIS — N186 End stage renal disease: Secondary | ICD-10-CM

## 2010-11-26 ENCOUNTER — Ambulatory Visit (HOSPITAL_COMMUNITY)
Admission: RE | Admit: 2010-11-26 | Discharge: 2010-11-26 | Disposition: A | Discharge status: Home / Self Care | Payer: Medicare Other | Source: Ambulatory Visit | Attending: Nephrology | Admitting: Nephrology

## 2010-11-26 DIAGNOSIS — N186 End stage renal disease: Secondary | ICD-10-CM

## 2010-11-26 DIAGNOSIS — Z4901 Encounter for fitting and adjustment of extracorporeal dialysis catheter: Secondary | ICD-10-CM | POA: Insufficient documentation

## 2010-12-14 ENCOUNTER — Other Ambulatory Visit (HOSPITAL_COMMUNITY): Payer: Self-pay | Admitting: Nephrology

## 2010-12-14 DIAGNOSIS — N186 End stage renal disease: Secondary | ICD-10-CM

## 2010-12-15 ENCOUNTER — Inpatient Hospital Stay (HOSPITAL_COMMUNITY)
Admission: AD | Admit: 2010-12-15 | Discharge: 2010-12-17 | DRG: 264 | Disposition: A | Discharge status: Home / Self Care | Payer: Medicare Other | Source: Ambulatory Visit | Attending: Vascular Surgery | Admitting: Vascular Surgery

## 2010-12-15 ENCOUNTER — Inpatient Hospital Stay (HOSPITAL_COMMUNITY): Payer: Medicare Other

## 2010-12-15 DIAGNOSIS — F0390 Unspecified dementia without behavioral disturbance: Secondary | ICD-10-CM | POA: Diagnosis present

## 2010-12-15 DIAGNOSIS — E875 Hyperkalemia: Secondary | ICD-10-CM | POA: Diagnosis present

## 2010-12-15 DIAGNOSIS — I959 Hypotension, unspecified: Secondary | ICD-10-CM | POA: Diagnosis not present

## 2010-12-15 DIAGNOSIS — E785 Hyperlipidemia, unspecified: Secondary | ICD-10-CM | POA: Diagnosis present

## 2010-12-15 DIAGNOSIS — Y841 Kidney dialysis as the cause of abnormal reaction of the patient, or of later complication, without mention of misadventure at the time of the procedure: Secondary | ICD-10-CM | POA: Diagnosis present

## 2010-12-15 DIAGNOSIS — D62 Acute posthemorrhagic anemia: Secondary | ICD-10-CM | POA: Diagnosis not present

## 2010-12-15 DIAGNOSIS — I498 Other specified cardiac arrhythmias: Secondary | ICD-10-CM | POA: Diagnosis present

## 2010-12-15 DIAGNOSIS — J4489 Other specified chronic obstructive pulmonary disease: Secondary | ICD-10-CM | POA: Diagnosis present

## 2010-12-15 DIAGNOSIS — Z7982 Long term (current) use of aspirin: Secondary | ICD-10-CM

## 2010-12-15 DIAGNOSIS — I120 Hypertensive chronic kidney disease with stage 5 chronic kidney disease or end stage renal disease: Secondary | ICD-10-CM | POA: Diagnosis present

## 2010-12-15 DIAGNOSIS — N186 End stage renal disease: Secondary | ICD-10-CM | POA: Diagnosis present

## 2010-12-15 DIAGNOSIS — Z79899 Other long term (current) drug therapy: Secondary | ICD-10-CM

## 2010-12-15 DIAGNOSIS — N2581 Secondary hyperparathyroidism of renal origin: Secondary | ICD-10-CM | POA: Diagnosis present

## 2010-12-15 DIAGNOSIS — I739 Peripheral vascular disease, unspecified: Secondary | ICD-10-CM | POA: Diagnosis present

## 2010-12-15 DIAGNOSIS — J449 Chronic obstructive pulmonary disease, unspecified: Secondary | ICD-10-CM | POA: Diagnosis present

## 2010-12-15 DIAGNOSIS — R112 Nausea with vomiting, unspecified: Secondary | ICD-10-CM | POA: Diagnosis not present

## 2010-12-15 DIAGNOSIS — T82898A Other specified complication of vascular prosthetic devices, implants and grafts, initial encounter: Secondary | ICD-10-CM

## 2010-12-15 DIAGNOSIS — Z87891 Personal history of nicotine dependence: Secondary | ICD-10-CM

## 2010-12-15 DIAGNOSIS — Z8673 Personal history of transient ischemic attack (TIA), and cerebral infarction without residual deficits: Secondary | ICD-10-CM

## 2010-12-15 LAB — CBC
Hemoglobin: 8.3 g/dL — ABNORMAL LOW (ref 13.0–17.0)
MCH: 31.7 pg (ref 26.0–34.0)
MCV: 96.2 fL (ref 78.0–100.0)
Platelets: 129 10*3/uL — ABNORMAL LOW (ref 150–400)
RBC: 2.62 MIL/uL — ABNORMAL LOW (ref 4.22–5.81)
WBC: 12.5 10*3/uL — ABNORMAL HIGH (ref 4.0–10.5)

## 2010-12-15 LAB — POCT I-STAT 4, (NA,K, GLUC, HGB,HCT)
Glucose, Bld: 99 mg/dL (ref 70–99)
Glucose, Bld: 101 mg/dL — ABNORMAL HIGH (ref 70–99)
HCT: 30 % — ABNORMAL LOW (ref 39.0–52.0)
HCT: 26 % — ABNORMAL LOW (ref 39.0–52.0)
Hemoglobin: 10.2 g/dL — ABNORMAL LOW (ref 13.0–17.0)
Hemoglobin: 8.8 g/dL — ABNORMAL LOW (ref 13.0–17.0)
Potassium: 9 mEq/L (ref 3.5–5.1)
Sodium: 136 mEq/L (ref 135–145)
Sodium: 134 mEq/L — ABNORMAL LOW (ref 135–145)

## 2010-12-15 LAB — CARDIAC PANEL(CRET KIN+CKTOT+MB+TROPI)
CK, MB: 4.6 ng/mL — ABNORMAL HIGH (ref 0.3–4.0)
Relative Index: INVALID (ref 0.0–2.5)
Total CK: 57 U/L (ref 7–232)
Troponin I: <0.3 ng/mL (ref <0.30)

## 2010-12-15 LAB — RENAL FUNCTION PANEL
CO2: 18 mEq/L — ABNORMAL LOW (ref 19–32)
Calcium: 8.4 mg/dL (ref 8.4–10.5)
Chloride: 106 mEq/L (ref 96–112)
Creatinine, Ser: 8.46 mg/dL — ABNORMAL HIGH (ref 0.50–1.35)
GFR calc Af Amer: 7 mL/min — ABNORMAL LOW (ref >60)
GFR calc non Af Amer: 6 mL/min — ABNORMAL LOW (ref >60)
Glucose, Bld: 132 mg/dL — ABNORMAL HIGH (ref 70–99)
Sodium: 142 mEq/L (ref 135–145)

## 2010-12-15 LAB — SURGICAL PCR SCREEN
MRSA, PCR: NEGATIVE
Staphylococcus aureus: NEGATIVE

## 2010-12-15 LAB — ABO/RH: ABO/RH(D): AB POS

## 2010-12-16 ENCOUNTER — Inpatient Hospital Stay (HOSPITAL_COMMUNITY): Payer: Medicare Other

## 2010-12-16 ENCOUNTER — Other Ambulatory Visit (HOSPITAL_COMMUNITY): Payer: Medicare Other

## 2010-12-16 LAB — CROSSMATCH
ABO/RH(D): AB POS
Unit division: 0

## 2010-12-16 LAB — BASIC METABOLIC PANEL
BUN: 92 mg/dL — ABNORMAL HIGH (ref 6–23)
CO2: 24 mEq/L (ref 19–32)
GFR calc non Af Amer: 6 mL/min — ABNORMAL LOW (ref >60)
Glucose, Bld: 123 mg/dL — ABNORMAL HIGH (ref 70–99)
Potassium: 5.4 mEq/L — ABNORMAL HIGH (ref 3.5–5.1)
Sodium: 140 mEq/L (ref 135–145)

## 2010-12-16 LAB — CBC
HCT: 25.9 % — ABNORMAL LOW (ref 39.0–52.0)
Hemoglobin: 8.8 g/dL — ABNORMAL LOW (ref 13.0–17.0)
MCH: 31.9 pg (ref 26.0–34.0)
MCHC: 34 g/dL (ref 30.0–36.0)
RBC: 2.76 MIL/uL — ABNORMAL LOW (ref 4.22–5.81)

## 2010-12-17 DIAGNOSIS — T82898A Other specified complication of vascular prosthetic devices, implants and grafts, initial encounter: Secondary | ICD-10-CM

## 2010-12-17 LAB — CBC
Hemoglobin: 7.9 g/dL — ABNORMAL LOW (ref 13.0–17.0)
MCH: 31.7 pg (ref 26.0–34.0)
MCHC: 33.6 g/dL (ref 30.0–36.0)
MCV: 94.4 fL (ref 78.0–100.0)
RBC: 2.49 MIL/uL — ABNORMAL LOW (ref 4.22–5.81)

## 2010-12-17 LAB — RENAL FUNCTION PANEL
BUN: 36 mg/dL — ABNORMAL HIGH (ref 6–23)
CO2: 31 mEq/L (ref 19–32)
Calcium: 8.7 mg/dL (ref 8.4–10.5)
Creatinine, Ser: 4.17 mg/dL — ABNORMAL HIGH (ref 0.50–1.35)
Glucose, Bld: 99 mg/dL (ref 70–99)
Phosphorus: 4.5 mg/dL (ref 2.3–4.6)
Sodium: 140 mEq/L (ref 135–145)

## 2010-12-17 NOTE — Op Note (Signed)
NAME:  Logan [name], Logan [name] NO.:  [id number]  MEDICAL RECORD NO.:  [id number]  LOCATION:  6523                         FACILITY:  [facility]  PHYSICIAN:  Fransisco Hertz, MD       DATE OF BIRTH:  01/18/1920  DATE OF PROCEDURE:  12/17/2010 DATE OF DISCHARGE:                              OPERATIVE REPORT   PROCEDURE: 1. Right upper arm arteriovenous graft cannulation under ultrasound     guidance. 2. Right arm shuntogram.  PREOPERATIVE DIAGNOSIS:  Malfunctioning right upper arm arteriovenous graft.  POSTOPERATIVE DIAGNOSIS:  Malfunctioning right upper arm arteriovenous graft.  SURGEON:  Fransisco Hertz, MD.  ESTIMATED BLOOD LOSS:  Minimal.  ANESTHESIA:  Local.  CONTRAST:  About 15 mL.  Specimens in this case were none.  FINDINGS:  Included 1. Widely patent right upper arm arteriovenous graft included the     arterial anastomosis. 2. The proximal right innominate vein appears to have a non-     hemodynamically significant stenosis as evident by brisk drainage     of the innominate vein.  INDICATIONS:  This is an 77 year old gentleman who recently underwent a thrombectomy and revision of his right upper arm graft.  By report on dialysis, he was not able to dialyze due to pulling clot out of this graft.  There was some concern that this graft was not functional. Subsequently, a shuntogram was arranged to better determine this issue. The patient is aware of the risks of this procedure include bleeding, infection, possible anaphylactic reaction to the dye and possible need for additional procedures in the future.  DESCRIPTION OF OPERATION:  As follows:  After full informed written consent was obtained from the patient, he was brought back to the angio suite, placed supine upon the angio table, was connected to the monitoring equipment.  He was then prepped and draped in the standard fashion for right upper arm access procedure.  Under ultrasound guidance, we  cannulated the right upper arm arteriovenous graft near its arterial anastomosis with a micropuncture needle, I passed a microwire into the graft and then I exchanged the needle for the micro sheath which was lodged about 2 cm into the graft.  This was secured in place with a Tegaderm.  The sheath was then connected to IV extension tubing and hand injections were completed to complete the shuntogram. This demonstrates widely patent arteriovenous graft.  I compressed the venous outflow to reflux the dye to the arterial end and it demonstrates absolutely no stenosis at the arterial anastomosis proximally.  There appears to be some degree of innominate vein stenosis proximally. However, at this point in the case, this patient became quite ill, nauseated, and started vomiting and I did not feel that immediate intervention was going to be necessary as he has had excellent thrill in the graft and excellent drainage through this central venous system. However, there is some concern whether or not in the future, he will need additional intervention at this level.  If he developed a pulsatile graft, my recommendation is to repeat this procedure and at that point readdressed a possible innominate vein stenosis.  COMPLICATIONS:  None.  Condition was stable.     Fransisco Hertz,  MD     BLC/MEDQ  D:  12/17/2010  T:  12/17/2010  Job:  [id number]  Electronically Signed by Leonides Sake MD on 12/17/2010 10:22:57 AM

## 2010-12-22 ENCOUNTER — Ambulatory Visit (HOSPITAL_COMMUNITY): Payer: Medicare Other

## 2010-12-22 ENCOUNTER — Ambulatory Visit (HOSPITAL_COMMUNITY)
Admission: RE | Admit: 2010-12-22 | Discharge: 2010-12-22 | Disposition: A | Discharge status: Home / Self Care | Payer: Medicare Other | Source: Ambulatory Visit | Attending: Vascular Surgery | Admitting: Vascular Surgery

## 2010-12-22 DIAGNOSIS — I120 Hypertensive chronic kidney disease with stage 5 chronic kidney disease or end stage renal disease: Secondary | ICD-10-CM | POA: Insufficient documentation

## 2010-12-22 DIAGNOSIS — I2510 Atherosclerotic heart disease of native coronary artery without angina pectoris: Secondary | ICD-10-CM | POA: Insufficient documentation

## 2010-12-22 DIAGNOSIS — N186 End stage renal disease: Secondary | ICD-10-CM | POA: Insufficient documentation

## 2010-12-22 LAB — POCT I-STAT 4, (NA,K, GLUC, HGB,HCT)
Glucose, Bld: 108 mg/dL — ABNORMAL HIGH (ref 70–99)
HCT: 25 % — ABNORMAL LOW (ref 39.0–52.0)
Hemoglobin: 8.5 g/dL — ABNORMAL LOW (ref 13.0–17.0)
Potassium: 5.5 mEq/L — ABNORMAL HIGH (ref 3.5–5.1)

## 2010-12-23 LAB — POCT I-STAT 4, (NA,K, GLUC, HGB,HCT)
Hemoglobin: 13.6
Potassium: 5
Sodium: 135

## 2010-12-23 NOTE — Op Note (Signed)
  NAME:  AMORE, GRATER NO.:  [id number]  MEDICAL RECORD NO.:  [id number]  LOCATION:  SDSC                         FACILITY:  [facility]  PHYSICIAN:  Janetta Hora. Fields, MD  DATE OF BIRTH:  07-13-1919  DATE OF PROCEDURE:  12/22/2010 DATE OF DISCHARGE:                              OPERATIVE REPORT   PROCEDURE: 1. Ultrasound of neck. 2. Insertion of Diatek catheter.  PREOPERATIVE DIAGNOSIS:  End-stage renal disease.  POSTOPERATIVE DIAGNOSIS:  End-stage renal disease.  ANESTHESIA:  Local with IV sedation.  ASSISTANT:  Nurse.  OPERATIVE FINDINGS:  A 23-cm Diatek catheter right internal jugular vein.  OPERATIVE DETAILS:  After obtaining informed consent, the patient was taken to the operating room.  The patient placed in supine position on operating table.  After adequate sedation, the patient's entire neck and chest were prepped and draped in usual sterile fashion.  Ultrasound was used to identify the right internal jugular vein.  This had normal compressibility and respiratory variation.  Local anesthesia was infiltrated over the area of the right internal jugular vein.  Using ultrasound guidance, the right internal jugular vein was successfully cannulated and a 0.035 J-tip guidewire threaded into the right internal jugular vein down in the inferior vena cava under fluoroscopic guidance. Next, sequential 12, 14 and 16-French dilators with peel-away sheath placed over the guidewire in the right atrium.  Guidewire and dilator removed and a 23-cm Diatek catheter was placed through the peel-away sheath down in the right atrium.  Catheter was then tunneled subcutaneously, cut to length and a hub attached.  The catheter was noted to flush and draw easily.  Catheter was sutured to skin with nylon sutures.  Next, insertion site was closed with Vicryl stitch.  Catheter was then loaded with concentrated heparin solution and a dry sterile dressing applied.  The patient  tolerated the procedure well, and there were no complications.  Instrument, sponge and needle counts were correct at the end of the case.  The patient taken to the recovery room in stable condition.     Janetta Hora. Fields, MD     CEF/MEDQ  D:  12/22/2010  T:  12/22/2010  Job:  [id number]  Electronically Signed by Fabienne Bruns MD on 12/23/2010 11:41:56 AM

## 2010-12-23 NOTE — Op Note (Signed)
NAME:  Logan [name], Logan [name] NO.:  [id number]  MEDICAL RECORD NO.:  [id number]  LOCATION:  SDSC                         FACILITY:  [facility]  PHYSICIAN:  Janetta Hora. Fields, MD  DATE OF BIRTH:  1920-02-03  DATE OF PROCEDURE:  12/15/2010 DATE OF DISCHARGE:                              OPERATIVE REPORT   PROCEDURE:  Thrombectomy and revision of right upper arm arteriovenous graft.  PREOPERATIVE DIAGNOSIS:  End-stage renal disease with thrombosed arteriovenous graft.  POSTOPERATIVE DIAGNOSIS:  End-stage renal disease with thrombosed arteriovenous graft.  ANESTHESIA:  General.  ASSISTANT:  Della Goo, PA-C  OPERATIVE FINDINGS: 1. Focal area of intimal hyperplasia, venous anastomosis. 2. Revision with 6-mm PTFE graft.  OPERATIVE DETAILS:  After obtaining informed consent, the patient was taken to the operating room.  The patient was placed in supine position on the operating table.  After induction of general anesthesia and placement of laryngeal mask, the patient's entire right upper extremity was prepped and draped in the usual sterile fashion.  Next, a longitudinal incision was made through a preexisting scar up in the right axilla.  Incision was carried down through subcutaneous tissues down to the level of the graft.  Graft was dissected free circumferentially.  Dissection was carried up to the level of venous anastomosis.  The vein was of good quality approximately 4.5 to 5 mm in diameter.  The patient was then given 5000 units of intravenous heparin. A transverse graftotomy was made just above the level of venous anastomosis.  There was obvious thrombus within this.  The venous limb of the graft was thrombectomized with a #4 Fogarty catheter.  I was not able to get the Fogarty balloon to pass through the venous anastomosis due to intimal hyperplasia.  Therefore, the vein was dissected free circumferentially several centimeters above the anastomosis and  the anastomosis was taken down.  There was a tight focal stenosis from intimal hyperplasia approximately 90%.  This thickened portion of vein was debrided away and the vein spatulated further up.  The vein was then thrombectomized and there was excellent venous backbleeding.  A 6-mm interposition graft was brought up in the operative field and sewn end- to-end to the vein using a running 6-0 Prolene suture.  At completion of anastomosis, it was thoroughly back bled and flushed with heparinized saline and reoccluded.  Graft was then cut to length for anastomosis to the proximal limb of the graft.  The arterial limb of the graft was then thrombectomized with a #4 Fogarty catheter.  Several passes were made until all thrombotic material was removed and arterialized plug was retrieved and there was excellent arterial inflow at this point.  The graft was clamped proximally and a graft-to-graft anastomosis was constructed using running 6-0 Prolene suture.  Just prior to completion of anastomosis, there fore bled, back bled, and thoroughly flushed. Anastomosis was secured and clamps were released.  There was palpable thrill in the graft immediately.  The patient had an audible radial and ulnar Doppler signal.  The ulnar signal augmented significantly with compression of the graft.  Hemostasis was obtained with administration of 50 mg protamine.  Subcutaneous portion of incision was reapproximated using running 3-0 Vicryl suture.  Skin  was closed with 4-0 Vicryl subcuticular stitch.  The patient tolerated the procedure well until we are replacing the dressings on the arm.  At this point, the patient had a bradycardic episode where his heart rate dropped into the 20s and this blood pressure into the 60s systolic.  This recovered within about 5-10 minutes with administration of atropine.  The patient then returned to his baseline heart rate of 50s to 60s with sinus rhythm.  He briefly went up into  the 70s and 80s with the atropine.  An I-Stat was checked in the operating room which showed his potassium was 7.1.  He was taken to the recovery room in stable condition with a plan to contact Nephrology service for urgent dialysis.  Instrument, sponge, and needle counts were correct at the end of the case.     Janetta Hora. Fields, MD     CEF/MEDQ  D:  12/15/2010  T:  12/16/2010  Job:  [id number]  Electronically Signed by Fabienne Bruns MD on 12/23/2010 11:41:49 AM

## 2010-12-31 NOTE — Discharge Summary (Signed)
NAME:  Logan [name], Logan [name] NO.:  [id number]  MEDICAL RECORD NO.:  [id number]  LOCATION:  6523                         FACILITY:  [facility]  PHYSICIAN:  Logan [name], M.D.DATE OF BIRTH:  08-Jun-1919  DATE OF ADMISSION:  12/15/2010 DATE OF DISCHARGE:  12/17/2010                              DISCHARGE SUMMARY   DISCHARGE DIAGNOSES: 1. Hyperkalemia. 2. Bradycardia secondary to hyperkalemia. 3. Acute blood loss anemia. 4. Hypotensive episode. 5. End-stage renal disease. 6. Secondary hyperparathyroidism.  PROCEDURES: 1. Thrombectomy and revision of right upper arteriovenous Gore-Tex     graft, Dr. Fabienne [name], on December 14, 2005. 2. Placement of a right temporary femoral catheter on December 15, 2010, Dr. Briant [name]. 3. Hemodialysis. 4. Shuntogram of right upper arteriovenous Gore-Tex graft showed:     a.     Widely patent right upper arteriovenous Gore-Tex graft      including the arterial anastomosis.     b.     Proximal right innominate vein appears to have had a non-      hemodynamically significant stenosis as evidence by brisk drainage      of the innominate vein.  HISTORY OF PRESENT ILLNESS:  Logan [name] is a delightful 77 year old white male with end-stage renal disease on Monday, Wednesday, Friday dialysis at the [facility] who was found to have a clotted graft on December 14, 2010, and had a declot on December 15, 2010, the date of admission.  During the end of his surgical procedure, he developed bradycardia with heart rate in the 20s and systolic blood pressure dropped into the 60s.  He was given atropine with increase in heart rate to 50-60.  His potassium i-STAT preop was 5.2, postop was 7.1.  He was admitted for emergent hemodialysis.  HOSPITAL COURSE: 1. Hyperkalemia.  The patient was unable to be dialyzed with his     declotted graft due to staph growing clots out of graft.     Therefore, a temporary  femoral catheter was placed and he was     dialyzed.  The following morning, repeat chemistry showed a     potassium of 5.4, BUN still elevated at 92, creatinine of 8.26.     Fortunately, his graft was actually found to be patent and he was     dialyzed again.  The morning of discharge, his creatinine was down     to 3.7, having had a good dialysis and BUN was 36 with a creatinine     of 4.17. 2. Bradycardia secondary to hyperkalemia resolved.  The patient's     carvedilol had been on hold during his hospitalization.  His heart     rate was up to 84 at the time of discharge.  His beta blocker will     be resumed. 3. Acute blood loss anemia.  Hemoglobin pre procedure on December 15, 2010, was 10.2, repeat postoperatively was 8.5, because I-stats     tend to read on the high side, he was transfused 2 units of packed     red cells during his dialysis treatment.  The following morning,     his hemoglobin was 8.8 and  the date of discharge after the 2 units     of packed red cells that he had received 2 days before the     hemoglobin was 7.9.  This was most likely an equilibrated     hemoglobin.  He is discharged on combination of Aranesp and     Venofer, he received 100 mcg of Aranesp on December 16, 2010. 4. Hypotensive episode, this resolved postoperatively but may have     contributed to his sluggish flows in his graft.  This also may have     been done due to acute blood loss.  Blood pressures the remainder     of his hospitalization remained between the 115-155 systolic range     which is normal for him.  He will continue on his same medications. 5. End-stage renal disease.  The patient had no change in his dialysis     prescription.  He will follow up tomorrow at his usual dialysis     center. Due to two declots in one month and problems with cannulation,     he had a shuntagram, the day of admission, which was found to have      no stenosis of the graft and a nonhemodynamically  significant innominate     stenosis.  Further follow-up with be with [facility] as needed. 6. Secondary hyperparathyroidism is well controlled, no changes in     this prescription. 7. Questionable reaction during the shuntogram possibly from contrast.     The patient developed nausea and vomiting during part of his     shuntogram today limited further evaluation of his innominate     stenosis.  He did not have any shortness of breath, tingling or     rash, is not clear if this was really a true contrast reaction.  CONDITION ON DISCHARGE:  Improved.  DISCHARGE DISPOSITION:  The patient discharged to home with his wife.  ACTIVITY:  As tolerated.  DISCHARGE MEDICATIONS: 1. Oxycodone 5 mg 1 tablet every 6 hours as needed. 2. Enteric-coated aspirin 81 mg daily. 3. Norvasc 10 mg at bedtime. 4. Carvedilol 6.25 mg b.i.d. 5. Isosorbide mononitrate 30 mg in the morning. 6. PhosLo 667 mg 2 tablets with meals three times a day. 7. Nephro-Vite 1 tablet daily. 8. Sensipar 30 mg daily. 9. Simvastatin 10 mg daily. 10.Soma 350 mg 1 tablet daily p.r.n. cramps and spasms.  DISCHARGE DIET:  Renal, limit fluids to 1200 mL per day.  45 minutes were spent completing the patient's discharge medication, discharge summary, and conveying discharge information to his dialysis center.     Logan [name], P.A.C.   ______________________________ Logan [name], M.D.    MB/MEDQ  D:  12/17/2010  T:  12/18/2010  Job:  [id number]  Electronically Signed by Logan [name] P.A. on 12/24/2010 12:34:54 PM Electronically Signed by Logan [name] M.D. on 12/31/2010 03:34:56 PM

## 2011-01-01 ENCOUNTER — Other Ambulatory Visit: Payer: Self-pay

## 2011-01-01 DIAGNOSIS — Z01810 Encounter for preprocedural cardiovascular examination: Secondary | ICD-10-CM

## 2011-01-01 DIAGNOSIS — N189 Chronic kidney disease, unspecified: Secondary | ICD-10-CM

## 2011-01-19 ENCOUNTER — Encounter: Payer: Self-pay | Admitting: Vascular Surgery

## 2011-01-20 ENCOUNTER — Encounter: Payer: Self-pay | Admitting: Vascular Surgery

## 2011-01-21 ENCOUNTER — Other Ambulatory Visit (INDEPENDENT_AMBULATORY_CARE_PROVIDER_SITE_OTHER): Payer: Medicare Other

## 2011-01-21 ENCOUNTER — Encounter: Payer: Self-pay | Admitting: Vascular Surgery

## 2011-01-21 ENCOUNTER — Ambulatory Visit (INDEPENDENT_AMBULATORY_CARE_PROVIDER_SITE_OTHER): Payer: Medicare Other | Admitting: Vascular Surgery

## 2011-01-21 VITALS — BP 154/66 | HR 65 | Ht 69.0 in | Wt 145.0 lb

## 2011-01-21 DIAGNOSIS — N186 End stage renal disease: Secondary | ICD-10-CM

## 2011-01-21 NOTE — Progress Notes (Signed)
[facility] [place] HISTORY AND PHYSICAL    History of Present Illness:  Patient is a 77 y.o. year old male who presents for consideration for placement of a new hemodialysis access. The patient initially had a left forearm AV graft placed in September of 2009. This subsequently failed and a left upper arm graft was placed in March of 2011. This subsequently failed and a right forearm AV graft was placed in may of 2012. This subsequently failed and a right upper arm graft was placed in July of 2012. This has now failed as well. The patient is also previously had a left above-knee dictation and 2006. He has known peripheral vascular disease in the right lower extremity.  Review of systems: He denies any shortness of breath or chest pain currently.  Physical Examination  Filed Vitals:   01/21/11 1438  BP: 154/66  Pulse: 65    Body mass index is 21.41 kg/(m^2).  General:  Alert and oriented, no acute distress Skin: No rash Extremities:  Multiple thrombosed grafts left and right forearm and upper arm Chest: Right-sided dialysis catheter with no evidence of infection   ASSESSMENT: Patient is an 77 year old male who has had multiple failed dialysis access procedures. His last 2 procedures both lasted less than 2 months. I would not consider him for a thigh graft due to his peripheral arterial disease. At this point due to the short term failure all follow his upper extremity grafts I also would not consider him for a HERO graft.     PLAN: I believe the best option at this point would be for him to continue to use his Diatek catheter. If he starts to have problems with this we could revisit the situation and consider a HERO graft. However, due to the patient's overall physical condition and limited duration of all previous access procedures I do not believe he is a very good candidate for anything else. He will followup on as-needed basis.   Fabienne Bruns, MD [facility] [place] Office: [phone] Pager: [phone]

## 2011-05-14 ENCOUNTER — Inpatient Hospital Stay (HOSPITAL_COMMUNITY): Payer: Medicare Other

## 2011-05-14 ENCOUNTER — Inpatient Hospital Stay (HOSPITAL_COMMUNITY)
Admission: EM | Admit: 2011-05-14 | Discharge: 2011-05-17 | DRG: 193 | Disposition: A | Discharge status: Skilled Nursing Facility | Payer: Medicare Other | Attending: Internal Medicine | Admitting: Internal Medicine

## 2011-05-14 ENCOUNTER — Encounter (HOSPITAL_COMMUNITY): Payer: Self-pay | Admitting: Internal Medicine

## 2011-05-14 ENCOUNTER — Emergency Department (HOSPITAL_COMMUNITY): Payer: Medicare Other

## 2011-05-14 ENCOUNTER — Other Ambulatory Visit: Payer: Self-pay

## 2011-05-14 DIAGNOSIS — M949 Disorder of cartilage, unspecified: Secondary | ICD-10-CM | POA: Diagnosis present

## 2011-05-14 DIAGNOSIS — J189 Pneumonia, unspecified organism: Principal | ICD-10-CM | POA: Diagnosis present

## 2011-05-14 DIAGNOSIS — R0600 Dyspnea, unspecified: Secondary | ICD-10-CM

## 2011-05-14 DIAGNOSIS — M899 Disorder of bone, unspecified: Secondary | ICD-10-CM | POA: Diagnosis present

## 2011-05-14 DIAGNOSIS — R05 Cough: Secondary | ICD-10-CM | POA: Diagnosis present

## 2011-05-14 DIAGNOSIS — N400 Enlarged prostate without lower urinary tract symptoms: Secondary | ICD-10-CM | POA: Diagnosis present

## 2011-05-14 DIAGNOSIS — I120 Hypertensive chronic kidney disease with stage 5 chronic kidney disease or end stage renal disease: Secondary | ICD-10-CM | POA: Diagnosis present

## 2011-05-14 DIAGNOSIS — N2581 Secondary hyperparathyroidism of renal origin: Secondary | ICD-10-CM | POA: Diagnosis present

## 2011-05-14 DIAGNOSIS — Z7982 Long term (current) use of aspirin: Secondary | ICD-10-CM

## 2011-05-14 DIAGNOSIS — N186 End stage renal disease: Secondary | ICD-10-CM | POA: Diagnosis present

## 2011-05-14 DIAGNOSIS — I2510 Atherosclerotic heart disease of native coronary artery without angina pectoris: Secondary | ICD-10-CM | POA: Diagnosis present

## 2011-05-14 DIAGNOSIS — R0902 Hypoxemia: Secondary | ICD-10-CM | POA: Diagnosis present

## 2011-05-14 DIAGNOSIS — D649 Anemia, unspecified: Secondary | ICD-10-CM | POA: Diagnosis present

## 2011-05-14 DIAGNOSIS — Z992 Dependence on renal dialysis: Secondary | ICD-10-CM

## 2011-05-14 DIAGNOSIS — S78119A Complete traumatic amputation at level between unspecified hip and knee, initial encounter: Secondary | ICD-10-CM

## 2011-05-14 DIAGNOSIS — Z8673 Personal history of transient ischemic attack (TIA), and cerebral infarction without residual deficits: Secondary | ICD-10-CM

## 2011-05-14 DIAGNOSIS — D72829 Elevated white blood cell count, unspecified: Secondary | ICD-10-CM

## 2011-05-14 DIAGNOSIS — I509 Heart failure, unspecified: Secondary | ICD-10-CM

## 2011-05-14 HISTORY — DX: Basal cell carcinoma of skin, unspecified: C44.91

## 2011-05-14 HISTORY — DX: Abnormal result of other cardiovascular function study: R94.39

## 2011-05-14 HISTORY — DX: Dependence on renal dialysis: Z99.2

## 2011-05-14 HISTORY — DX: End stage renal disease: N18.6

## 2011-05-14 LAB — COMPREHENSIVE METABOLIC PANEL
Albumin: 3.6 g/dL (ref 3.5–5.2)
BUN: 43 mg/dL — ABNORMAL HIGH (ref 6–23)
Calcium: 9.9 mg/dL (ref 8.4–10.5)
Chloride: 101 mEq/L (ref 96–112)
Creatinine, Ser: 5.73 mg/dL — ABNORMAL HIGH (ref 0.50–1.35)
GFR calc non Af Amer: 8 mL/min — ABNORMAL LOW (ref >90)
Total Bilirubin: 0.3 mg/dL (ref 0.3–1.2)

## 2011-05-14 LAB — DIFFERENTIAL
Basophils Relative: 0 % (ref 0–1)
Eosinophils Relative: 5 % (ref 0–5)
Monocytes Absolute: 1.2 10*3/uL — ABNORMAL HIGH (ref 0.1–1.0)
Monocytes Relative: 10 % (ref 3–12)
Neutro Abs: 8.6 10*3/uL — ABNORMAL HIGH (ref 1.7–7.7)

## 2011-05-14 LAB — GLUCOSE, CAPILLARY: Glucose-Capillary: 122 mg/dL — ABNORMAL HIGH (ref 70–99)

## 2011-05-14 LAB — CARDIAC PANEL(CRET KIN+CKTOT+MB+TROPI)
CK, MB: 3.5 ng/mL (ref 0.3–4.0)
Total CK: 50 U/L (ref 7–232)

## 2011-05-14 LAB — CBC
HCT: 37.2 % — ABNORMAL LOW (ref 39.0–52.0)
Hemoglobin: 12 g/dL — ABNORMAL LOW (ref 13.0–17.0)
MCH: 32.9 pg (ref 26.0–34.0)
MCHC: 32.3 g/dL (ref 30.0–36.0)
MCV: 101.9 fL — ABNORMAL HIGH (ref 78.0–100.0)
RDW: 19.9 % — ABNORMAL HIGH (ref 11.5–15.5)

## 2011-05-14 LAB — PRO B NATRIURETIC PEPTIDE: Pro B Natriuretic peptide (BNP): 12423 pg/mL — ABNORMAL HIGH (ref 0–450)

## 2011-05-14 MED ORDER — SENNA 8.6 MG PO TABS
1.0000 | ORAL_TABLET | Freq: Two times a day (BID) | ORAL | Status: DC
  Administered 2011-05-14 – 2011-05-17 (×7): 8.6 mg via ORAL
  Filled 2011-05-14 (×8): qty 1

## 2011-05-14 MED ORDER — ACETAMINOPHEN 325 MG PO TABS: 650.0000 mg | ORAL_TABLET | Freq: Four times a day (QID) | ORAL | Status: DC | PRN

## 2011-05-14 MED ORDER — HEPARIN SODIUM (PORCINE) 1000 UNIT/ML DIALYSIS
1000.0000 [IU] | INTRAMUSCULAR | Status: DC | PRN
  Filled 2011-05-14: qty 1

## 2011-05-14 MED ORDER — ASPIRIN EC 81 MG PO TBEC
81.0000 mg | DELAYED_RELEASE_TABLET | Freq: Every day | ORAL | Status: DC
  Administered 2011-05-14 – 2011-05-17 (×4): 81 mg via ORAL
  Filled 2011-05-14 (×4): qty 1

## 2011-05-14 MED ORDER — CINACALCET HCL 30 MG PO TABS
30.0000 mg | ORAL_TABLET | Freq: Every day | ORAL | Status: DC
  Administered 2011-05-14 – 2011-05-17 (×4): 30 mg via ORAL
  Filled 2011-05-14 (×5): qty 1

## 2011-05-14 MED ORDER — DOCUSATE SODIUM 283 MG RE ENEM
1.0000 | ENEMA | RECTAL | Status: DC | PRN
  Filled 2011-05-14: qty 1

## 2011-05-14 MED ORDER — SODIUM CHLORIDE 0.9 % IV SOLN: 100.0000 mL | INTRAVENOUS | Status: DC | PRN

## 2011-05-14 MED ORDER — ZOLPIDEM TARTRATE 5 MG PO TABS: 5.0000 mg | ORAL_TABLET | Freq: Every evening | ORAL | Status: DC | PRN

## 2011-05-14 MED ORDER — AMLODIPINE BESYLATE 10 MG PO TABS
10.0000 mg | ORAL_TABLET | Freq: Every day | ORAL | Status: DC
  Administered 2011-05-14 – 2011-05-17 (×4): 10 mg via ORAL
  Filled 2011-05-14 (×4): qty 1

## 2011-05-14 MED ORDER — ACETAMINOPHEN 650 MG RE SUPP: 650.0000 mg | Freq: Four times a day (QID) | RECTAL | Status: DC | PRN

## 2011-05-14 MED ORDER — CAMPHOR-MENTHOL 0.5-0.5 % EX LOTN
1.0000 "application " | TOPICAL_LOTION | Freq: Three times a day (TID) | CUTANEOUS | Status: DC | PRN
  Filled 2011-05-14: qty 222

## 2011-05-14 MED ORDER — AZITHROMYCIN 500 MG PO TABS
500.0000 mg | ORAL_TABLET | Freq: Every day | ORAL | Status: AC
  Administered 2011-05-14: 500 mg via ORAL
  Filled 2011-05-14: qty 1

## 2011-05-14 MED ORDER — ASPIRIN 81 MG PO TBEC: 81.0000 mg | DELAYED_RELEASE_TABLET | Freq: Every day | ORAL | Status: DC

## 2011-05-14 MED ORDER — HYDROXYZINE HCL 25 MG PO TABS
25.0000 mg | ORAL_TABLET | Freq: Three times a day (TID) | ORAL | Status: DC | PRN
  Filled 2011-05-14: qty 1

## 2011-05-14 MED ORDER — CARVEDILOL 6.25 MG PO TABS
6.2500 mg | ORAL_TABLET | Freq: Two times a day (BID) | ORAL | Status: DC
  Administered 2011-05-14 – 2011-05-17 (×8): 6.25 mg via ORAL
  Filled 2011-05-14 (×9): qty 1

## 2011-05-14 MED ORDER — AZITHROMYCIN 250 MG PO TABS
250.0000 mg | ORAL_TABLET | Freq: Every day | ORAL | Status: DC
Start: 2011-05-15 — End: 2011-05-17
  Administered 2011-05-15 – 2011-05-17 (×3): 250 mg via ORAL
  Filled 2011-05-14 (×3): qty 1

## 2011-05-14 MED ORDER — DEXTROSE 5 % IV SOLN
1.0000 g | INTRAVENOUS | Status: DC
  Administered 2011-05-14 – 2011-05-17 (×4): 1 g via INTRAVENOUS
  Filled 2011-05-14 (×5): qty 10

## 2011-05-14 MED ORDER — ISOSORBIDE MONONITRATE ER 60 MG PO TB24
60.0000 mg | ORAL_TABLET | Freq: Every day | ORAL | Status: DC
  Administered 2011-05-14 – 2011-05-17 (×4): 60 mg via ORAL
  Filled 2011-05-14 (×4): qty 1

## 2011-05-14 MED ORDER — SODIUM CHLORIDE 0.9 % IJ SOLN
3.0000 mL | Freq: Two times a day (BID) | INTRAMUSCULAR | Status: DC
  Administered 2011-05-14 – 2011-05-17 (×7): 3 mL via INTRAVENOUS

## 2011-05-14 MED ORDER — DOCUSATE SODIUM 100 MG PO CAPS
100.0000 mg | ORAL_CAPSULE | Freq: Two times a day (BID) | ORAL | Status: DC
  Administered 2011-05-14 – 2011-05-17 (×7): 100 mg via ORAL
  Filled 2011-05-14 (×8): qty 1

## 2011-05-14 MED ORDER — ALBUTEROL SULFATE (5 MG/ML) 0.5% IN NEBU: 2.5000 mg | INHALATION_SOLUTION | Freq: Four times a day (QID) | RESPIRATORY_TRACT | Status: DC | PRN

## 2011-05-14 MED ORDER — ONDANSETRON HCL 4 MG PO TABS: 4.0000 mg | ORAL_TABLET | Freq: Four times a day (QID) | ORAL | Status: DC | PRN

## 2011-05-14 MED ORDER — LIDOCAINE HCL (PF) 1 % IJ SOLN: 5.0000 mL | INTRAMUSCULAR | Status: DC | PRN

## 2011-05-14 MED ORDER — PENTAFLUOROPROP-TETRAFLUOROETH EX AERO: 1.0000 "application " | INHALATION_SPRAY | CUTANEOUS | Status: DC | PRN

## 2011-05-14 MED ORDER — HEPARIN SODIUM (PORCINE) 1000 UNIT/ML DIALYSIS
20.0000 [IU]/kg | INTRAMUSCULAR | Status: DC | PRN
  Filled 2011-05-14: qty 2

## 2011-05-14 MED ORDER — HEPARIN SODIUM (PORCINE) 5000 UNIT/ML IJ SOLN
5000.0000 [IU] | Freq: Three times a day (TID) | INTRAMUSCULAR | Status: DC
  Administered 2011-05-14 – 2011-05-17 (×9): 5000 [IU] via SUBCUTANEOUS
  Filled 2011-05-14 (×13): qty 1

## 2011-05-14 MED ORDER — SIMVASTATIN 10 MG PO TABS
10.0000 mg | ORAL_TABLET | Freq: Every day | ORAL | Status: DC
  Administered 2011-05-14 – 2011-05-17 (×4): 10 mg via ORAL
  Filled 2011-05-14 (×4): qty 1

## 2011-05-14 MED ORDER — CALCIUM ACETATE 667 MG PO CAPS
667.0000 mg | ORAL_CAPSULE | Freq: Three times a day (TID) | ORAL | Status: DC
  Administered 2011-05-14: 667 mg via ORAL
  Filled 2011-05-14 (×4): qty 1

## 2011-05-14 MED ORDER — CALCIUM ACETATE 667 MG PO CAPS
1334.0000 mg | ORAL_CAPSULE | Freq: Three times a day (TID) | ORAL | Status: DC
  Administered 2011-05-14 – 2011-05-17 (×9): 1334 mg via ORAL
  Filled 2011-05-14 (×11): qty 2

## 2011-05-14 MED ORDER — CALCIUM CARBONATE 1250 MG/5ML PO SUSP
500.0000 mg | Freq: Four times a day (QID) | ORAL | Status: DC | PRN
  Filled 2011-05-14: qty 5

## 2011-05-14 MED ORDER — ONDANSETRON HCL 4 MG/2ML IJ SOLN: 4.0000 mg | Freq: Four times a day (QID) | INTRAMUSCULAR | Status: DC | PRN

## 2011-05-14 MED ORDER — SORBITOL 70 % SOLN
30.0000 mL | Status: DC | PRN
  Filled 2011-05-14: qty 30

## 2011-05-14 MED ORDER — ALTEPLASE 2 MG IJ SOLR
2.0000 mg | Freq: Once | INTRAMUSCULAR | Status: AC | PRN
  Filled 2011-05-14: qty 2

## 2011-05-14 MED ORDER — LIDOCAINE-PRILOCAINE 2.5-2.5 % EX CREA
1.0000 "application " | TOPICAL_CREAM | CUTANEOUS | Status: DC | PRN
  Filled 2011-05-14: qty 5

## 2011-05-14 NOTE — Progress Notes (Signed)
 [facility]  On HD via R IJ tunneled cath BP-- 126/52 Goal--4.3 L Hgb--  12 K+--4.4 No complaints for now.

## 2011-05-14 NOTE — ED Provider Notes (Signed)
History     CSN: [id number]  Arrival date & time 05/14/11  0144   First MD Initiated Contact with Patient 05/14/11 [phone]      Chief Complaint  Patient presents with  . Cough    (Consider location/radiation/quality/duration/timing/severity/associated sxs/prior treatment) HPI Comments: Patient with history of ESRD on HD, CVA, PVD, CAD, COPD.  Presents for eval of difficulty breathing and cough that started tonight.  He denies productive cough.  No chest pain.  Patient is a 78 y.o. male presenting with cough. The history is provided by the patient.  Cough This is a new problem. The current episode started yesterday. The problem occurs constantly. The problem has been gradually worsening. The cough is non-productive. There has been no fever. He has tried nothing for the symptoms. He is not a smoker.    Past Medical History  Diagnosis Date  . Chronic kidney disease   . Hypertension   . Dialysis patient     MONDAY, WEDNESDAY, and FRIDAY  . Stroke 2005  . Anemia   . Secondary hyperparathyroidism   . Peripheral vascular disease, unspecified   . Cancer     History of Basal Cell Carcinoma  . BPH (benign prostatic hypertrophy)   . History of Clostridium difficile infection   . Cataract     Past Surgical History  Procedure Date  . Insertion of dialysis catheter 09/27/2010    Left IJ Diatek Catheter  . Above knee leg amputation 12/26/2004    Left AKA by Dr. Darrick Penna  . Tonsillectomy and adenoidectomy   . Thrombectomy / arteriovenous graft revision     Numerous revision and declots  . Femoral bypass 2005    Left Femoral to posterior tibial BPG w/ composite PTFE and vein  . Hernia repair   . Appendectomy     Family History  Problem Relation Age of Onset  . Heart disease Maternal Uncle     History  Substance Use Topics  . Smoking status: Never Smoker   . Smokeless tobacco: Not on file  . Alcohol Use: No      Review of Systems  Respiratory: Positive for cough.   All  other systems reviewed and are negative.    Allergies  Review of patient's allergies indicates no known allergies.  Home Medications   Current Outpatient Rx  Name Route Sig Dispense Refill  . AMLODIPINE BESYLATE 10 MG PO TABS Oral Take 10 mg by mouth daily.      . ASPIRIN 81 MG PO TBEC Oral Take 81 mg by mouth daily.      Marland Kitchen RENA-VITE PO Oral Take by mouth daily.      Marland Kitchen CALCIUM ACETATE 667 MG PO CAPS Oral Take 667 mg by mouth 3 (three) times daily with meals.      Marland Kitchen CARISOPRODOL 350 MG PO TABS Oral Take 350 mg by mouth 3 (three) times daily as needed.      Marland Kitchen CARVEDILOL 6.25 MG PO TABS Oral Take 6.25 mg by mouth 2 (two) times daily with a meal.      . CINACALCET HCL 30 MG PO TABS Oral Take 30 mg by mouth daily.      . ISOSORBIDE MONONITRATE ER 60 MG PO TB24 Oral Take 60 mg by mouth daily.      Marland Kitchen SIMVASTATIN 10 MG PO TABS Oral Take 10 mg by mouth at bedtime.      Marland Kitchen VITAMIN B-1 100 MG PO TABS Oral Take 100 mg by mouth daily.  BP 156/57  Pulse 62  Temp(Src) 97.9 F (36.6 C) (Oral)  Resp 20  SpO2 96%  Physical Exam  Constitutional: He is oriented to person, place, and time. He appears well-developed and well-nourished. No distress.  HENT:  Head: Normocephalic and atraumatic.  Mouth/Throat: Oropharynx is clear and moist.  Neck: Normal range of motion. Neck supple.  Cardiovascular: Normal rate and regular rhythm.   No murmur heard. Pulmonary/Chest: Effort normal.       Rales in bases bilaterally.  Abdominal: Soft. Bowel sounds are normal. He exhibits no distension. There is no tenderness.  Musculoskeletal: Normal range of motion. He exhibits no edema.       Patient status post left lower extremity amputation.  Lymphadenopathy:    He has no cervical adenopathy.  Neurological: He is alert and oriented to person, place, and time.  Skin: Skin is warm and dry. He is not diaphoretic.    ED Course  Procedures (including critical care time)   Labs Reviewed  CBC    DIFFERENTIAL  COMPREHENSIVE METABOLIC PANEL  PRO B NATRIURETIC PEPTIDE   No results found.   No diagnosis found.   Date: 05/14/2011  Rate: 66  Rhythm: sinus arrhythmia  QRS Axis: normal  Intervals: normal  ST/T Wave abnormalities: normal  Conduction Disutrbances:none  Narrative Interpretation:   Old EKG Reviewed: unchanged    MDM  The patient presents with shortness of breath and cough for the past several days.  Tonight was having worsening difficulty breathing and called 911.  The workup reveals an elevated wbc and possible left sided pneumonia, as well a markedly elevated bnp.  I suspect the cause of the dyspnea is related to both an infectious etiology and likely volume overload as the patient is on hemodialysis.  He has been hypoxic with saturations ranging in the upper 80's to low 90's.  He lives in an independent living setting with his wife and I am not convinced he is able to return to this environment.  I will consult medicine for admission.        Geoffery [name], MD 05/14/11 [phone]

## 2011-05-14 NOTE — ED Notes (Signed)
Pt due for dialysis tomm; pt did go this week as scheduled.

## 2011-05-14 NOTE — ED Notes (Signed)
Patient is resting comfortably. 

## 2011-05-14 NOTE — Progress Notes (Signed)
Utilization review complete 

## 2011-05-14 NOTE — Progress Notes (Signed)
Patient ID: Logan [name], male   DOB: 13-Feb-1920, 78 y.o.   MRN: [id number]  Assessment/Plan:  Principal Problem:   *CAP (community acquired pneumonia) - treat with azithromycin and ceftriaxone - follow up blood cultures results  Active Problems:   Hypoxia - likely seocndary to Community acquired pneumonia - saturating 96% on room air   ESRD (end stage renal disease) on dialysis - as per renal schedule   Leukocytosis - Perhaps secondary to pneumonia versus UTI - continue antibiotic as above   EDUCATION - test results and diagnostic studies were discussed with patient  at the bedside - questions were answered at the bedside and contact information was provided for additional questions or concerns  Subjective: No events overnight. Patient denies chest pain, shortness of breath, abdominal pain.   Objective:  Vital signs in last 24 hours:  Filed Vitals:   05/14/11 1750 05/14/11 1800 05/14/11 1816 05/14/11 2137  BP: 122/46 131/66 167/70 130/54  Pulse: 54 53 49 51  Temp:   97.2 F (36.2 C) 98.8 F (37.1 C)  TempSrc:   Oral Oral  Resp: 22 20 22 20   Height:      Weight:   66.9 kg (147 lb 7.8 oz)   SpO2: 99% 100% 100% 96%    Intake/Output from previous day:   Intake/Output Summary (Last 24 hours) at 05/14/11 2155 Last data filed at 05/14/11 1900  Gross per 24 hour  Intake    603 ml  Output   3076 ml  Net  -2473 ml    Physical Exam: General: Alert, awake, oriented x3, in no acute distress. HEENT: No bruits, no goiter. Moist mucous membranes, no scleral icterus, no conjunctival pallor. Heart: Regular rate and rhythm, S1/S2 +, no murmurs, rubs, gallops. Lungs: Clear to auscultation bilaterally. No wheezing, no rhonchi, no rales.  Abdomen: Soft, nontender, nondistended, positive bowel sounds. Extremities: No clubbing or cyanosis, no pitting edema,  positive pedal pulses. Neuro: Grossly nonfocal.  Lab Results:  Lab 05/14/11 0211  WBC 12.2*  HGB 12.0*    HCT 37.2*  PLT 222  MCV 101.9*    Lab 05/14/11 0211  NA 141  K 4.4  CL 101  CO2 24  GLUCOSE 91  BUN 43*  CREATININE 5.73*  CALCIUM 9.9    Lab 05/14/11 0224  CKMB 3.5  TROPONINI <0.30  MYOGLOBIN --    Studies/Results: Dg Chest Portable 1 View 05/14/2011   IMPRESSION:  1.  Mildly more prominent left basilar airspace opacity; mild superimposed pneumonia cannot be excluded. 2.  Stable appearance to right-sided pleural-parenchymal scarring. 3.  Borderline cardiomegaly and mild chronic vascular congestion.     Medications: Scheduled Meds:   . amLODipine  10 mg Oral Daily  . aspirin EC  81 mg Oral Daily  . azithromycin  500 mg Oral Daily  . calcium acetate  1,334 mg Oral TID WC  . carvedilol  6.25 mg Oral BID WC  . cefTRIAXone (ROCEPHIN)  IV  1 g Intravenous Q24H  . cinacalcet  30 mg Oral Q breakfast  . docusate sodium  100 mg Oral BID  . isosorbide mononitrate  60 mg Oral Daily  . senna  1 tablet Oral BID  . simvastatin  10 mg Oral q1800     LOS: 0 days   Logan [name] 05/14/2011, 9:55 PM  [facility] Pager: [phone]

## 2011-05-14 NOTE — Consult Note (Signed)
[facility] Renal Consultation Note  Indication for Consultation:  Management of ESRD/hemodialysis; anemia, hypertension/volume and secondary hyperparathyroidism  HPI: Logan [name] is a 78 y.o. male  with ESRD, HTN, hx CVA, PVD presented last evening with SOB.  He states this has resolved, although I am seeing him in a sitting position today.  Presented to ED after a 911 call  Yesterday. Patient was admitted for possible pneumonia.  Dialysis Orders: Center: [facility]  on MWF . EDW 67.5 HD Bath 2K 2.25 Ca  Time 4 Heparin 5000. Access right I-J BFR 400 DFR A 1.5   Zemplar 2 mcg IV/HD Epogen 13,000 last Hgb 11.2   Units IV/HD  Venofer  none Other profile 4  Past Medical History  Diagnosis Date  . Hypertension   . ESRD on dialysis     MONDAY, WEDNESDAY, and FRIDAY  . Stroke 2005  . Anemia   . Secondary hyperparathyroidism   . Peripheral vascular disease, unspecified   . Basal cell carcinoma   . BPH (benign prostatic hypertrophy)   . History of Clostridium difficile infection   . Cataract   . Abnormal nuclear stress test 2005    Mild distal anterior ischemia on nuclear test  . Shortness of breath    Past Surgical History  Procedure Date  . Insertion of dialysis catheter 09/27/2010    Left IJ Diatek Catheter  . Above knee leg amputation 12/26/2004    Left AKA by Dr. Darrick Penna  . Tonsillectomy and adenoidectomy   . Thrombectomy / arteriovenous graft revision     Numerous revision and declots  . Femoral bypass 2005    Left Femoral to posterior tibial BPG w/ composite PTFE and vein  . Hernia repair   . Appendectomy    Family History  Problem Relation Age of Onset  . Heart disease Maternal Uncle   . Heart attack Father     79's   . Heart attack Sister     59's   Mother died of old age; Has brother 52 minor health problems.  Social History: Quit smoking 40 years ago.  Married x 63 years. Grad of [employer]. Retired Programmer, systems. Taught AP World History.  Played  viola several symphonies, including [address], [address]- [place] and [place]. No Known Allergies Prior to Admission medications   Medication Sig Start Date End Date Taking? Authorizing Provider  amLODipine (NORVASC) 10 MG tablet Take 10 mg by mouth daily.      Historical Provider, MD  aspirin 81 MG EC tablet Take 81 mg by mouth daily.      Historical Provider, MD  B Complex-C-Folic Acid (RENA-VITE PO) Take by mouth daily.      Historical Provider, MD  calcium acetate (PHOSLO) 667 MG capsule Take 667 mg by mouth 3 (three) times daily with meals.      Historical Provider, MD  carisoprodol (SOMA) 350 MG tablet Take 350 mg by mouth 3 (three) times daily as needed.      Historical Provider, MD  carvedilol (COREG) 6.25 MG tablet Take 6.25 mg by mouth 2 (two) times daily with a meal.      Historical Provider, MD  cinacalcet (SENSIPAR) 30 MG tablet Take 30 mg by mouth daily.      Historical Provider, MD  isosorbide mononitrate (IMDUR) 60 MG 24 hr tablet Take 60 mg by mouth daily.      Historical Provider, MD  simvastatin (ZOCOR) 10 MG tablet Take 10 mg by mouth at bedtime.  Historical Provider, MD  Thiamine HCl (VITAMIN B-1) 100 MG tablet Take 100 mg by mouth daily.      Historical Provider, MD   Current Facility-Administered Medications  Medication Dose Route Frequency Provider Last Rate Last Dose  . acetaminophen (TYLENOL) tablet 650 mg  650 mg Oral Q6H PRN Carlota Raspberry, MD       Or  . acetaminophen (TYLENOL) suppository 650 mg  650 mg Rectal Q6H PRN Carlota Raspberry, MD      . albuterol (PROVENTIL) (5 MG/ML) 0.5% nebulizer solution 2.5 mg  2.5 mg Nebulization Q6H PRN Carlota Raspberry, MD      . amLODipine (NORVASC) tablet 10 mg  10 mg Oral Daily Carlota Raspberry, MD   10 mg at 05/14/11 1052  . aspirin EC tablet 81 mg  81 mg Oral Daily Carlota Raspberry, MD   81 mg at 05/14/11 1052  . azithromycin (ZITHROMAX) tablet 500 mg  500 mg Oral Daily Carlota Raspberry, MD   500 mg at 05/14/11 1052   Followed by  . azithromycin  (ZITHROMAX) tablet 250 mg  250 mg Oral Daily Carlota Raspberry, MD      . calcium acetate (PHOSLO) capsule 667 mg  667 mg Oral TID WC Carlota Raspberry, MD      . carvedilol (COREG) tablet 6.25 mg  6.25 mg Oral BID WC Carlota Raspberry, MD   6.25 mg at 05/14/11 1051  . cefTRIAXone (ROCEPHIN) 1 g in dextrose 5 % 50 mL IVPB  1 g Intravenous Q24H Carlota Raspberry, MD   1 g at 05/14/11 0659  . cinacalcet (SENSIPAR) tablet 30 mg  30 mg Oral Q breakfast Carlota Raspberry, MD   30 mg at 05/14/11 1051  . docusate sodium (COLACE) capsule 100 mg  100 mg Oral BID Carlota Raspberry, MD   100 mg at 05/14/11 1052  . heparin injection 5,000 Units  5,000 Units Subcutaneous Q8H Carlota Raspberry, MD      . isosorbide mononitrate (IMDUR) 24 hr tablet 60 mg  60 mg Oral Daily Carlota Raspberry, MD   60 mg at 05/14/11 1052  . senna (SENOKOT) tablet 8.6 mg  1 tablet Oral BID Carlota Raspberry, MD   8.6 mg at 05/14/11 1052  . simvastatin (ZOCOR) tablet 10 mg  10 mg Oral q1800 Carlota Raspberry, MD      . sodium chloride 0.9 % injection 3 mL  3 mL Intravenous Q12H Carlota Raspberry, MD   3 mL at 05/14/11 1053  . DISCONTD: aspirin EC tablet 81 mg  81 mg Oral Daily Carlota Raspberry, MD       Labs: Basic Metabolic Panel:  Lab 05/14/11 1478  NA 141  K 4.4  CL 101  CO2 24  GLUCOSE 91  BUN 43*  CREATININE 5.73*  CALCIUM 9.9  ALB --  PHOS --   Liver Function Tests:  Lab 05/14/11 0211  AST 15  ALT 9  ALKPHOS 83  BILITOT 0.3  PROT 6.4  ALBUMIN 3.6  CBC:  Lab 05/14/11 0211  WBC 12.2*  NEUTROABS 8.6*  HGB 12.0*  HCT 37.2*  MCV 101.9*  PLT 222   Cardiac Enzymes:  Lab 05/14/11 0224  CKTOTAL 50  CKMB 3.5  CKMBINDEX --  TROPONINI <0.30  Studies/Results: Dg Chest Portable 1 View  05/14/2011  *RADIOLOGY REPORT*  Clinical Data: Persistent cough.  PORTABLE CHEST - 1 VIEW  Comparison: Chest radiograph performed 12/22/2010  Findings: The lungs are relatively well-aerated.  Right-sided pleural-parenchymal scarring is unchanged in appearance.  Mild  left basilar airspace opacity appears  slightly more prominent; mild pneumonia cannot be excluded.  No definite pleural effusion or pneumothorax is seen.  Mild chronic vascular congestion is noted.  The cardiomediastinal silhouette is borderline enlarged; a right- sided dual lumen catheter is noted ending about the cavoatrial junction.  No acute osseous abnormalities are seen.  IMPRESSION:  1.  Mildly more prominent left basilar airspace opacity; mild superimposed pneumonia cannot be excluded. 2.  Stable appearance to right-sided pleural-parenchymal scarring. 3.  Borderline cardiomegaly and mild chronic vascular congestion.  Original Report Authenticated By: Tonia Ghent, M.D.    ROS: Denies fever, chills, cough, N, V, D, chest pain.  SOB has resolved.  Appetite is good. Is sleepy   Physical Exam: Filed Vitals:   05/14/11 1058  BP: 155/60  Pulse: 65 - 84  Temp: 97.9  Resp: 20   O2 sat 92 - 98%   General: elderly gentleman breathing easily off O2 sitting up in reclinner. Doses off if not talking. HEENT: MMM Eyes: sclera clear Neck: no JVD Heart:  RRR with ectopy Lungs: exp rhonchi Abdomen: soft nontender Extremities: no signficant right LE edema; 1 + DP pulse on right; left AKA Skin: pale warm and dry Neuro: answers questions easily, but dozes off; minimizes problems. Dialysis Access: R I-J; multiple clotted accesses  Assessment/Plan: 1. SOB, hypoxia with cough on admission - ? Infectious process. + WBC, afebrile. Azithromycin start plus nebulizer treatments; rocephin given in ED 2. ESRD -  HD today with usual orders 3 Hypertension/volume  - norvasc and volume control 4. Anemia  - Hgb 12 no ESA 5. Metabolic bone disease - zemplar 2; continue phoslo out pt dose is 3 with breakfast; 2 with other meals; change to 2 ac here 6. Nutrition - renal diet  Sheffield Slider, PA-C [facility] [phone] 05/14/2011, 12:32 PM    Logan [name] was adm last night for SOB and ? Pneumonia.  He says he had mainly  orthopnea.  No purulent sputum production, no fever.  His WBC today is 12,200.  The findings on CXR could all be from fluid overload and may improve with fluid removal with dialysis.  He is on azithromycin currently.  Will check PA and Lat CXR in AM and see if any improvemnet

## 2011-05-14 NOTE — ED Notes (Signed)
Pt reports he called EMS because he thought he may need oxygen and could not breathe out of his nose. Pt c/o congested nose.

## 2011-05-14 NOTE — Evaluation (Signed)
Physical Therapy Evaluation Patient Details Name: Logan [name] MRN: [id number] DOB: [birth year] Today's Date: 05/14/2011  Problem List:  Patient Active Problem List  Diagnoses  . Cough  . CAP (community acquired pneumonia)  . Hypoxia  . ESRD (end stage renal disease) on dialysis  . Leukocytosis    Past Medical History:  Past Medical History  Diagnosis Date  . Hypertension   . ESRD on dialysis     MONDAY, WEDNESDAY, and FRIDAY  . Stroke 2005  . Anemia   . Secondary hyperparathyroidism   . Peripheral vascular disease, unspecified   . Basal cell carcinoma   . BPH (benign prostatic hypertrophy)   . History of Clostridium difficile infection   . Cataract   . Abnormal nuclear stress test 2005    Mild distal anterior ischemia on nuclear test  . Shortness of breath    Past Surgical History:  Past Surgical History  Procedure Date  . Insertion of dialysis catheter 09/27/2010    Left IJ Diatek Catheter  . Above knee leg amputation 12/26/2004    Left AKA by Dr. Darrick Penna  . Tonsillectomy and adenoidectomy   . Thrombectomy / arteriovenous graft revision     Numerous revision and declots  . Femoral bypass 2005    Left Femoral to posterior tibial BPG w/ composite PTFE and vein  . Hernia repair   . Appendectomy     PT Assessment/Plan/Recommendation PT Assessment Clinical Impression Statement: Patient s/p cough with decline in functional status due to confusion and weakness.  Will benefit from PT to address strength and endurance.  Wife reports if his cognition improves, she feels confident to take him home with HHPT f/u.   PT Recommendation/Assessment: Patient will need skilled PT in the acute care venue PT Problem List: Decreased strength;Decreased activity tolerance;Decreased balance;Decreased mobility;Decreased cognition;Decreased safety awareness PT Therapy Diagnosis : Generalized weakness PT Plan PT Frequency: Min 3X/week PT Treatment/Interventions: DME  instruction;Functional mobility training;Therapeutic activities;Therapeutic exercise;Wheelchair mobility training;Balance training;Patient/family education PT Recommendation Follow Up Recommendations: Home health PT Equipment Recommended: None recommended by PT PT Goals  Acute Rehab PT Goals PT Goal Formulation: With patient/family Time For Goal Achievement: 2 weeks Pt will go Supine/Side to Sit: with modified independence;with rail;with cues (comment type and amount) PT Goal: Supine/Side to Sit - Progress: Goal set today Pt will go Sit to Stand: with min assist;with upper extremity assist PT Goal: Sit to Stand - Progress: Goal set today Pt will Transfer Bed to Chair/Chair to Bed: with min assist;with cues (comment type and amount) PT Transfer Goal: Bed to Chair/Chair to Bed - Progress: Goal set today Pt will Perform Home Exercise Program: with supervision, verbal cues required/provided PT Goal: Perform Home Exercise Program - Progress: Goal set today  PT Evaluation Precautions/Restrictions  Precautions Precautions: Fall Required Braces or Orthoses: No (Has prosthesis but never wears.) Restrictions Weight Bearing Restrictions: No Prior Functioning  Home Living Lives With: Spouse Receives Help From: Family Type of Home: Apartment Transport planner) Home Layout: One level Home Access: Ramped entrance Bathroom Shower/Tub: Engineer, manufacturing systems: Standard Bathroom Accessibility: Yes How Accessible: Accessible via wheelchair Home Adaptive Equipment: Hand-held shower hose;Tub transfer bench;Bedside commode/3-in-1;Wheelchair - manual;Walker - rolling (Sliding board) Prior Function Level of Independence: Requires assistive device for independence;Needs assistance with ADLs;Needs assistance with tranfers Bath: Moderate Toileting: Minimal Dressing: Minimal Driving: No Cognition Cognition Arousal/Alertness: Awake/alert Overall Cognitive Status: Appears within functional  limits for tasks assessed Sensation/Coordination Sensation Light Touch: Appears Intact Stereognosis: Not tested Hot/Cold: Not tested Proprioception:  Not tested Coordination Gross Motor Movements are Fluid and Coordinated: Yes Fine Motor Movements are Fluid and Coordinated: Yes Extremity Assessment RUE Assessment RUE Assessment: Within Functional Limits LUE Assessment LUE Assessment: Within Functional Limits RLE Assessment RLE Assessment: Exceptions to [facility] RLE Strength RLE Overall Strength: Deficits RLE Overall Strength Comments: Unable to straighten right LE fully - lacks about 8 degrees of full extension.  Knee extension 3-/5, knee flexion 3/5, hip flexion 3-/5, hip extension 2+/5, ankle grossly 2+/5. LLE Assessment LLE Assessment: Exceptions to [facility] LLE Strength LLE Overall Strength: Deficits;Due to premorbid status LLE Overall Strength Comments: Left LE amputation, hip grossly 3-/5 Mobility (including Balance) Bed Mobility Bed Mobility: Yes Rolling Right: 4: Min assist;With rail Rolling Right Details (indicate cue type and reason): cues for anterior translation Right Sidelying to Sit: 4: Min assist;With rails;HOB flat Right Sidelying to Sit Details (indicate cue type and reason): cues for anterior translation Sitting - Scoot to Edge of Bed: 4: Min assist;With rail Sitting - Scoot to [employer] of Bed Details (indicate cue type and reason): Needed asssit for weight shifting Transfers Transfers: Yes Squat Pivot Transfers: 1: +2 Total assist;Patient percentage (comment);From elevated surface;With upper extremity assistance (pt = 70%) Squat Pivot Transfer Details (indicate cue type and reason): Pt. ussually does sliding board transfer at home to w/c.  Does squat pivots to 3N1.  Assisted patient to recliner via squat pivot with pt. performing over 1/2 the work.   Ambulation/Gait Ambulation/Gait: No Stairs: No Wheelchair Mobility Wheelchair Mobility: No  Posture/Postural  Control Posture/Postural Control: No significant limitations Balance Balance Assessed: Yes Static Sitting Balance Static Sitting - Balance Support: Bilateral upper extremity supported (1 foot supported) Static Sitting - Level of Assistance: 5: Stand by assistance Static Sitting - Comment/# of Minutes: 4 minutes   End of Session PT - End of Session Equipment Utilized During Treatment: Gait belt Activity Tolerance: Patient tolerated treatment well Patient left: in chair;with call bell in reach;with family/visitor present Nurse Communication: Mobility status for transfers General Behavior During Session: [facility] for tasks performed Cognition: [facility] for tasks performed  INGOLD,Yaritzel Stange 05/14/2011, 2:19 PM  [facility] Acute Rehabilitation [phone] (pager)

## 2011-05-14 NOTE — Progress Notes (Signed)
Patient admitted to floor from [facility] ED for cough and evaluation of possible pneumonia.  Patient arrived via stretcher and required help to transfer from stretcher to bed.  He is alert and oriented on arrival, on room air, and in no apparent distress.  Productive cough noted on assessment.  Patient was placed on continuous telemetry and oriented to room and floor procedures.  All needs placed within reach.  Will continue to monitor patient condition. Royden Purl RN

## 2011-05-14 NOTE — ED Notes (Signed)
Pt placed on 2L nasal cannula.

## 2011-05-14 NOTE — ED Notes (Signed)
Pt reports he has had a cough for 2 days with nasal congestion. Has not taken any meds for it. Called EMS and wanted to come in to hospital.

## 2011-05-14 NOTE — H&P (Signed)
PCP:  DUNHAM,CYNTHIA B, MD, MD  The patient has no idea who any of his doctors are or where he gets dialysis  Chief Complaint:  Difficulty breathing  HPI: 89yoM with h/o CAD via abnormal stress test 2005, ESRD on HD (MWF), CVA, PVD presents with  nasal congestion, cough, hypoxia, minimal leukcytosis, possible opacity on CXR.   Despite numerous comorbidities and advanced age, pt has not been medically admitted for quite  awhile, with several admission in 2012 mostly for vascular HD access issues. He is a poor  historian, but can relate that he now comes in because at home he was unable to breath due to  nasal congestion, and has also been having a few days of minimally productive cough. He does  a poor job of giving any further info, i.e. sneezing, sore throat, etc. but he does deny any  chest pain or GI issues. He called EMS bc of difficulty breathing.   In the ED he was initially hypoxic upper 80's, put on O2 with increased appropriately. Labs  with renal 43/5.73, normal LFT's. Negative cardiac enzymes x1. BNP 12k. WBC 12.2 with normal  diff, Hct 37.2. CXR with mildly more prominent left basilar opacity, mildly superimposed PNA  not excluded; stable right pleural parenchymal scarring; borderline cardiomegaly and mild  chronic vascular congestion.   ROS as above otherwise negative or not obtainable.   Past Medical History  Diagnosis Date  . Hypertension   . ESRD on dialysis     MONDAY, WEDNESDAY, and FRIDAY  . Stroke 2005  . Anemia   . Secondary hyperparathyroidism   . Peripheral vascular disease, unspecified   . Basal cell carcinoma   . BPH (benign prostatic hypertrophy)   . History of Clostridium difficile infection   . Cataract   . Abnormal nuclear stress test 2005    Mild distal anterior ischemia on nuclear test    Past Surgical History  Procedure Date  . Insertion of dialysis catheter 09/27/2010    Left IJ Diatek Catheter  . Above knee leg amputation 12/26/2004      Left AKA by Dr. Darrick Penna  . Tonsillectomy and adenoidectomy   . Thrombectomy / arteriovenous graft revision     Numerous revision and declots  . Femoral bypass 2005    Left Femoral to posterior tibial BPG w/ composite PTFE and vein  . Hernia repair   . Appendectomy     Medications:  HOME MEDS: Pt had absolutely no idea what meds he takes Prior to Admission medications   Medication Sig Start Date End Date Taking? Authorizing Provider  amLODipine (NORVASC) 10 MG tablet Take 10 mg by mouth daily.      Historical Provider, MD  aspirin 81 MG EC tablet Take 81 mg by mouth daily.      Historical Provider, MD  B Complex-C-Folic Acid (RENA-VITE PO) Take by mouth daily.      Historical Provider, MD  calcium acetate (PHOSLO) 667 MG capsule Take 667 mg by mouth 3 (three) times daily with meals.      Historical Provider, MD  carisoprodol (SOMA) 350 MG tablet Take 350 mg by mouth 3 (three) times daily as needed.      Historical Provider, MD  carvedilol (COREG) 6.25 MG tablet Take 6.25 mg by mouth 2 (two) times daily with a meal.      Historical Provider, MD  cinacalcet (SENSIPAR) 30 MG tablet Take 30 mg by mouth daily.      Historical Provider, MD  isosorbide  mononitrate (IMDUR) 60 MG 24 hr tablet Take 60 mg by mouth daily.      Historical Provider, MD  simvastatin (ZOCOR) 10 MG tablet Take 10 mg by mouth at bedtime.      Historical Provider, MD  Thiamine HCl (VITAMIN B-1) 100 MG tablet Take 100 mg by mouth daily.      Historical Provider, MD    Allergies:  No Known Allergies  Social History:   reports that he has never smoked. He does not have any smokeless tobacco history on file. He reports that he does not drink alcohol or use illicit drugs. Retired Runner, broadcasting/film/video, married, currently living in independent living facility with his wife. Gets around via wheelchair.   Family History: Family History  Problem Relation Age of Onset  . Heart disease Maternal Uncle   . Heart attack Father     19's    . Heart attack Brother     50's     Physical Exam: Filed Vitals:   05/14/11 0144 05/14/11 0245 05/14/11 0330 05/14/11 0430  BP: 156/57 159/48 172/47 159/57  Pulse: 62 58 60 60  Temp: 97.9 F (36.6 C)     TempSrc: Oral     Resp: 20     SpO2: 96% 92% 93% 98%   Blood pressure 159/57, pulse 60, temperature 97.9 F (36.6 C), temperature source Oral, resp. rate 20, SpO2 98.00%.  Gen: Very elderly appearing M in no distress, not able to give good history, suspect due to  baseline cognitive deficits, but able to answer simple questions accurately. No breathing  difficulty, speaking full sentences, unlabored  HEENT: Left pupil is not round. Right pupil is round but there is a black sliver in the iris  at 11 o'clock. Sclera are clear. Mouth is moist Lungs: CTAB, no adventitious sounds that I can appreciate. Good air movement Heart: Regular with pauses, no m/g  Abd: Soft, NT ND, benign overall, no facial grimacing Extrem: Warm, thin, perfusing well, with scars from fistulas in place. LLE amputed. RLE has  soft pitting edema around his ankle and slightly up the calf Neuro: Alert, attentive, but poor historian, forgetful, moves his upper extremities well and  able to sit up in bed with minimal assistance. Grossly non-focal   Labs & Imaging Results for orders placed during the hospital encounter of 05/14/11 (from the past 48 hour(s))  CBC     Status: Abnormal   Collection Time   05/14/11  2:11 AM      Component Value Range Comment   WBC 12.2 (*) 4.0 - 10.5 (K/uL)    RBC 3.65 (*) 4.22 - 5.81 (MIL/uL)    Hemoglobin 12.0 (*) 13.0 - 17.0 (g/dL)    HCT 09.8 (*) 11.9 - 52.0 (%)    MCV 101.9 (*) 78.0 - 100.0 (fL)    MCH 32.9  26.0 - 34.0 (pg)    MCHC 32.3  30.0 - 36.0 (g/dL)    RDW 14.7 (*) 82.9 - 15.5 (%)    Platelets 222  150 - 400 (K/uL)   DIFFERENTIAL     Status: Abnormal   Collection Time   05/14/11  2:11 AM      Component Value Range Comment   Neutrophils Relative 71  43 - 77 (%)     Neutro Abs 8.6 (*) 1.7 - 7.7 (K/uL)    Lymphocytes Relative 14  12 - 46 (%)    Lymphs Abs 1.7  0.7 - 4.0 (K/uL)    Monocytes Relative 10  3 - 12 (%)    Monocytes Absolute 1.2 (*) 0.1 - 1.0 (K/uL)    Eosinophils Relative 5  0 - 5 (%)    Eosinophils Absolute 0.6  0.0 - 0.7 (K/uL)    Basophils Relative 0  0 - 1 (%)    Basophils Absolute 0.0  0.0 - 0.1 (K/uL)   COMPREHENSIVE METABOLIC PANEL     Status: Abnormal   Collection Time   05/14/11  2:11 AM      Component Value Range Comment   Sodium 141  135 - 145 (mEq/L)    Potassium 4.4  3.5 - 5.1 (mEq/L)    Chloride 101  96 - 112 (mEq/L)    CO2 24  19 - 32 (mEq/L)    Glucose, Bld 91  70 - 99 (mg/dL)    BUN 43 (*) 6 - 23 (mg/dL)    Creatinine, Ser 1.61 (*) 0.50 - 1.35 (mg/dL)    Calcium 9.9  8.4 - 10.5 (mg/dL)    Total Protein 6.4  6.0 - 8.3 (g/dL)    Albumin 3.6  3.5 - 5.2 (g/dL)    AST 15  0 - 37 (U/L)    ALT 9  0 - 53 (U/L)    Alkaline Phosphatase 83  39 - 117 (U/L)    Total Bilirubin 0.3  0.3 - 1.2 (mg/dL)    GFR calc non Af Amer 8 (*) >90 (mL/min)    GFR calc Af Amer 9 (*) >90 (mL/min)   PRO B NATRIURETIC PEPTIDE     Status: Abnormal   Collection Time   05/14/11  2:11 AM      Component Value Range Comment   Pro B Natriuretic peptide (BNP) 12423.0 (*) 0 - 450 (pg/mL)   CARDIAC PANEL(CRET KIN+CKTOT+MB+TROPI)     Status: Normal   Collection Time   05/14/11  2:24 AM      Component Value Range Comment   Total CK 50  7 - 232 (U/L)    CK, MB 3.5  0.3 - 4.0 (ng/mL)    Troponin I <0.30  <0.30 (ng/mL)    Relative Index RELATIVE INDEX IS INVALID  0.0 - 2.5     Dg Chest Portable 1 View  05/14/2011  *RADIOLOGY REPORT*  Clinical Data: Persistent cough.  PORTABLE CHEST - 1 VIEW  Comparison: Chest radiograph performed 12/22/2010  Findings: The lungs are relatively well-aerated.  Right-sided pleural-parenchymal scarring is unchanged in appearance.  Mild left basilar airspace opacity appears slightly more prominent; mild pneumonia cannot be  excluded.  No definite pleural effusion or pneumothorax is seen.  Mild chronic vascular congestion is noted.  The cardiomediastinal silhouette is borderline enlarged; a right- sided dual lumen catheter is noted ending about the cavoatrial junction.  No acute osseous abnormalities are seen.  IMPRESSION:  1.  Mildly more prominent left basilar airspace opacity; mild superimposed pneumonia cannot be excluded. 2.  Stable appearance to right-sided pleural-parenchymal scarring. 3.  Borderline cardiomegaly and mild chronic vascular congestion.  Original Report Authenticated By: Tonia Ghent, M.D.   ECG: NSR but with a long pause noted. Normal axis. Normal P and PR interval. Narrow QRS. No  frank ST deviation. Lateral flattish T waves but still upright. Similar to prior.   05/2009 Study Conclusions  - Left ventricle: The cavity size was normal. Wall thickness was    increased in a pattern of mild LVH. Systolic function was normal.    The estimated ejection fraction was in the range of 55% to  60%.  - Aortic valve: Mild regurgitation.  - Mitral valve: Calcified annulus. Mildly thickened leaflets .  - Left atrium: The atrium was mildly dilated.  - Atrial septum: No defect or patent foramen ovale was identified.  Impression Present on Admission:  .Cough .Hypoxia .ESRD (end stage renal disease) on dialysis .Leukocytosis  89yoM with h/o CAD via abnormal stress test 2005, ESRD on HD (MWF), CVA, PVD presents with  nasal congestion, cough, hypoxia, minimal leukcytosis, possible opacity on CXR.   1. Cough, hypoxia, possible left basilar opacity, minimal leukocytosis: Possible viral vs  atypical PNA vs evolving CAP. He doesn't seem that ill to me so will observe him overnight.  He was initially noted to by hypoxic to the 80's and imporved to [place]. I turned his O2 off and  he eventually desatted to the 80's, but was sleeping and this quickly improved on it own, and  may be indicative of some sleep apnea.    Although BNP 12k, don't feel CHF is overwhelmingly the cause of his symptoms at present,  although he may have some chronic diastolic HF to explain the number itself. Will defer  further extensive w/u at this time, just dialyze him for the extra vascular congestion noted  on CXR.  - Will admit him observation. Ceftriaxone / Azithromycin, wean O2 and if stable then switch  to Levofloxacin. No IVF's. Nebs PRN  2. HTN/CAD: Continue home amlodipine, ASA 81, carvedilol, imdur, statin  3. ESRD: On HD MWF and will likely need HD on Friday, will call to alert renal. Continue home  phoslo, sensipar.  4. Anemia: Hct 37 with MCV actually appears much better than prior of 25 but Hct appears to  bounce around a lot. It seems within or better than recent baseline though. Of note, his plt  count 222 is also much better then prior 88 and is within his baseline.   Telemetry, MC team 7 Full code, discussed reasonably well with pt   Other plans as per orders.  Maycen Degregory 05/14/2011, 5:37 AM

## 2011-05-15 ENCOUNTER — Inpatient Hospital Stay (HOSPITAL_COMMUNITY): Payer: Medicare Other

## 2011-05-15 LAB — BASIC METABOLIC PANEL
BUN: 38 mg/dL — ABNORMAL HIGH (ref 6–23)
CO2: 27 mEq/L (ref 19–32)
Chloride: 97 mEq/L (ref 96–112)
GFR calc Af Amer: 11 mL/min — ABNORMAL LOW (ref >90)
Glucose, Bld: 156 mg/dL — ABNORMAL HIGH (ref 70–99)
Potassium: 4.4 mEq/L (ref 3.5–5.1)

## 2011-05-15 LAB — GLUCOSE, CAPILLARY

## 2011-05-15 LAB — CBC
HCT: 38.8 % — ABNORMAL LOW (ref 39.0–52.0)
Hemoglobin: 12.4 g/dL — ABNORMAL LOW (ref 13.0–17.0)
MCHC: 32 g/dL (ref 30.0–36.0)

## 2011-05-15 MED ORDER — RENA-VITE PO TABS
1.0000 | ORAL_TABLET | Freq: Every day | ORAL | Status: DC
  Administered 2011-05-16 (×2): 1 via ORAL
  Filled 2011-05-15 (×4): qty 1

## 2011-05-15 NOTE — Progress Notes (Signed)
1100 Assessment completed by Landry Dyke, SN [facility]

## 2011-05-15 NOTE — Progress Notes (Signed)
[facility] Progress Note Subjective:  Feels better. Still coughing. No problems with dialysis yesterday. Very soft BM today.  Objective Filed Vitals:   05/14/11 1816 05/14/11 2137 05/20/2011 0507 May 20, 2011 1100  BP: 167/70 130/54 138/48 140/60  Pulse: 49 51 66 56  Temp: 97.2 F (36.2 C) 98.8 F (37.1 C) 98.9 F (37.2 C)   TempSrc: Oral Oral Oral   Resp: 22 20 18    Height:      Weight: 66.9 kg (147 lb 7.8 oz)  63.6 kg (140 lb 3.4 oz)   SpO2: 100% 96% 97%    Physical Exam: Wife at bedside General: Comfortable alert and engaging; breathing easily on room air Heart: RRR Lungs: few crackles left base Abdomen: soft NT Extremities: no right LE edema; left AKA no edema Dialysis Access: right I-J catheter  Problem/Plan: 1. SOB due CAP and/or excess volume - on azithromycin and ceftriaxone; CXR today small bilateral plueral effussion o/w NAD; net UF 3 Liters with post wt 63.6 (EDW 67.5); more coughing observed today than yesterday 2. ESRD - MWF - lower EDW at d/c; Noted K 4.4 today and 4.4 pre HD yesterday; 3. Anemia - Hgb > 12 to ESA 4. Secondary hyperparathyroidism - continue current meds 5. HTN/volume - BP still up; would continue to challenge volume at next dialysis; on norvasc and coreg 6. Nutrition - renal diet  Additional Objective Labs: Basic Metabolic Panel:  Lab 05/20/11 1610 05/14/11 0211  NA 139 141  K 4.4 4.4  CL 97 101  CO2 27 24  GLUCOSE 156* 91  BUN 38* 43*  CREATININE 4.81* 5.73*  CALCIUM 9.5 9.9  ALB -- --  PHOS -- --   Liver Function Tests:  Lab 05/14/11 0211  AST 15  ALT 9  ALKPHOS 83  BILITOT 0.3  PROT 6.4  ALBUMIN 3.6  CBC:  Lab 20-May-2011 1001 05/14/11 0211  WBC 10.1 12.2*  NEUTROABS -- 8.6*  HGB 12.4* 12.0*  HCT 38.8* 37.2*  MCV 102.1* 101.9*  PLT 163 222   Cardiac Enzymes:  Lab 05/14/11 0224  CKTOTAL 50  CKMB 3.5  CKMBINDEX --  TROPONINI <0.30   CBG:  Lab 05-20-2011 0550 05/14/11 0901  GLUCAP 95 122*    Studies/Results: Dg Chest 2 View 2011/05/20    IMPRESSION:  1.  Resolution of interstitial pulmonary edema post dialysis. 2.  Residual very small bilateral pleural effusions.  Residual small bilateral pleural effusions.  No acute cardiopulmonary disease otherwise. 3.  Stable pleuroparenchymal scarring involving the right mid lung and right base.  Original Report Authenticated By: Arnell Sieving, M.D.   Medications:      . amLODipine  10 mg Oral Daily  . aspirin EC  81 mg Oral Daily  . azithromycin  250 mg Oral Daily  . calcium acetate  1,334 mg Oral TID WC  . carvedilol  6.25 mg Oral BID WC  . cefTRIAXone (ROCEPHIN)  IV  1 g Intravenous Q24H  . cinacalcet  30 mg Oral Q breakfast  . docusate sodium  100 mg Oral BID  . heparin  5,000 Units Subcutaneous Q8H  . isosorbide mononitrate  60 mg Oral Daily  . senna  1 tablet Oral BID  . simvastatin  10 mg Oral q1800  . sodium chloride  3 mL Intravenous Q12H  . DISCONTD: calcium acetate  667 mg Oral TID WC    I  have reviewed scheduled and prn medications.  Sheffield Slider, PA-C [employer] [phone]  05/15/2011,11:17 AM  LOS: 1 day   Coughing less at this point --wife at bedside.  CXR has almost completely reverted to normal with fluid removal.  I'd suggest d/c ceftriaxone, lower dry weight on Monday in dialysis, then home.

## 2011-05-15 NOTE — Progress Notes (Signed)
Patient ID: Logan [name], male   DOB: [birth year], 78 y.o.   MRN: [id number]  Assessment/Plan:   Principal Problem:   *CAP (community acquired pneumonia)  - treat with azithromycin (D/C ceftriaxone) - follow up blood cultures results   Active Problems:   Hypoxia  - likely seocndary to Community acquired pneumonia  - saturating 91% on room air   ESRD (end stage renal disease) on dialysis  - as per renal schedule   Leukocytosis  - Perhaps secondary to pneumonia versus UTI  - continue antibiotic as above   EDUCATION  - test results and diagnostic studies were discussed with patient at the bedside  - questions were answered at the bedside and contact information was provided for additional questions or concerns  Anticipated discharge Monday 05/17/2011    Subjective: No events overnight. Patient denies chest pain, shortness of breath, abdominal pain.   Objective:  Vital signs in last 24 hours:  Filed Vitals:   05/14/11 2137 05/15/11 0507 05/15/11 1100 05/15/11 1528  BP: 130/54 138/48 140/60 142/50  Pulse: 51 66 56 59  Temp: 98.8 F (37.1 C) 98.9 F (37.2 C)  98.2 F (36.8 C)  TempSrc: Oral Oral  Oral  Resp: 20 18  19   Height:      Weight:  63.6 kg (140 lb 3.4 oz)    SpO2: 96% 97%  91%    Intake/Output from previous day:   Intake/Output Summary (Last 24 hours) at 05/15/11 1550 Last data filed at 05/15/11 0844  Gross per 24 hour  Intake    120 ml  Output   3076 ml  Net  -2956 ml    Physical Exam: General: Alert, awake, oriented x3, in no acute distress. HEENT: No bruits, no goiter. Moist mucous membranes, no scleral icterus, no conjunctival pallor. Heart: Regular rate and rhythm, S1/S2 +, no murmurs, rubs, gallops. Lungs: Clear to auscultation bilaterally. No wheezing, no rhonchi, no rales.  Abdomen: Soft, nontender, nondistended, positive bowel sounds. Extremities: left BKA Neuro: Grossly nonfocal.  Lab Results:  Basic Metabolic Panel:      Component Value Date/Time   NA 139 05/15/2011 1001   K 4.4 05/15/2011 1001   CL 97 05/15/2011 1001   CO2 27 05/15/2011 1001   BUN 38* 05/15/2011 1001   CREATININE 4.81* 05/15/2011 1001   GLUCOSE 156* 05/15/2011 1001   CALCIUM 9.5 05/15/2011 1001   CBC:    Component Value Date/Time   WBC 10.1 05/15/2011 1001   HGB 12.4* 05/15/2011 1001   HCT 38.8* 05/15/2011 1001   PLT 163 05/15/2011 1001   MCV 102.1* 05/15/2011 1001   NEUTROABS 8.6* 05/14/2011 0211   LYMPHSABS 1.7 05/14/2011 0211   MONOABS 1.2* 05/14/2011 0211   EOSABS 0.6 05/14/2011 0211   BASOSABS 0.0 05/14/2011 0211      Lab 05/15/11 1001 05/14/11 0211  WBC 10.1 12.2*  HGB 12.4* 12.0*  HCT 38.8* 37.2*  PLT 163 222  MCV 102.1* 101.9*  MCH 32.6 32.9  MCHC 32.0 32.3  RDW 19.9* 19.9*  LYMPHSABS -- 1.7  MONOABS -- 1.2*  EOSABS -- 0.6  BASOSABS -- 0.0  BANDABS -- --    Lab 05/15/11 1001 05/14/11 0211  NA 139 141  K 4.4 4.4  CL 97 101  CO2 27 24  GLUCOSE 156* 91  BUN 38* 43*  CREATININE 4.81* 5.73*  CALCIUM 9.5 9.9  MG -- --   No results found for this basename: INR:5,PROTIME:5 in the last 168 hours Cardiac markers:  Lab 05/14/11 0224  CKMB 3.5  TROPONINI <0.30  MYOGLOBIN --   No components found with this basename: POCBNP:3 Recent Results (from the past 240 hour(s))  MRSA PCR SCREENING     Status: Normal   Collection Time   05/14/11  8:08 AM      Component Value Range Status Comment   MRSA by PCR NEGATIVE  NEGATIVE  Final     Studies/Results: Dg Chest 2 View  05/15/2011  *RADIOLOGY REPORT*  Clinical Data: Chest pain.  Shortness of breath.  Generalized weakness.  Post dialysis.  CHEST - 2 VIEW 05/15/2011:  Comparison: Portable chest x-ray yesterday, 12/22/2010, 12/16/2010 [facility].  Findings: Right jugular dialysis catheter tips remain in the mid and lower SVC.  Interval resolution of the interstitial pulmonary edema since yesterday.  Persistent small bilateral pleural effusions.  Stable pleuroparenchymal  scarring laterally in the right mid chest and at the right base.  No new pulmonary parenchymal abnormalities.  Cardiac silhouette enlarged but stable. Thoracic aorta tortuous atherosclerotic, unchanged.  IMPRESSION:  1.  Resolution of interstitial pulmonary edema post dialysis. 2.  Residual very small bilateral pleural effusions.  Residual small bilateral pleural effusions.  No acute cardiopulmonary disease otherwise. 3.  Stable pleuroparenchymal scarring involving the right mid lung and right base.  Original Report Authenticated By: Arnell Sieving, M.D.   Dg Chest Portable 1 View  05/14/2011  *RADIOLOGY REPORT*  Clinical Data: Persistent cough.  PORTABLE CHEST - 1 VIEW  Comparison: Chest radiograph performed 12/22/2010  Findings: The lungs are relatively well-aerated.  Right-sided pleural-parenchymal scarring is unchanged in appearance.  Mild left basilar airspace opacity appears slightly more prominent; mild pneumonia cannot be excluded.  No definite pleural effusion or pneumothorax is seen.  Mild chronic vascular congestion is noted.  The cardiomediastinal silhouette is borderline enlarged; a right- sided dual lumen catheter is noted ending about the cavoatrial junction.  No acute osseous abnormalities are seen.  IMPRESSION:  1.  Mildly more prominent left basilar airspace opacity; mild superimposed pneumonia cannot be excluded. 2.  Stable appearance to right-sided pleural-parenchymal scarring. 3.  Borderline cardiomegaly and mild chronic vascular congestion.  Original Report Authenticated By: Tonia Ghent, M.D.    Medications: Scheduled Meds:   . amLODipine  10 mg Oral Daily  . aspirin EC  81 mg Oral Daily  . azithromycin  250 mg Oral Daily  . calcium acetate  1,334 mg Oral TID WC  . carvedilol  6.25 mg Oral BID WC  . cefTRIAXone (ROCEPHIN)  IV  1 g Intravenous Q24H  . cinacalcet  30 mg Oral Q breakfast  . docusate sodium  100 mg Oral BID  . heparin  5,000 Units Subcutaneous Q8H  .  isosorbide mononitrate  60 mg Oral Daily  . multivitamin  1 tablet Oral QHS  . senna  1 tablet Oral BID  . simvastatin  10 mg Oral q1800  . sodium chloride  3 mL Intravenous Q12H   Continuous Infusions:  PRN Meds:.sodium chloride, sodium chloride, acetaminophen, acetaminophen, albuterol, alteplase, calcium carbonate (dosed in mg elemental calcium), camphor-menthol, docusate sodium, heparin, heparin, hydrOXYzine, lidocaine, lidocaine-prilocaine, ondansetron (ZOFRAN) IV, ondansetron, pentafluoroprop-tetrafluoroeth, sorbitol, zolpidem    EDUCATION - test results and diagnostic studies were discussed with patient and pt's family who was present at the bedside - patient and family have verbalized the understanding - questions were answered at the bedside and contact information was provided for additional questions or concerns   LOS: 1 day   Koraline Phillipson  05/15/2011, 3:50 PM  [facility] Pager: [phone]

## 2011-05-16 LAB — BASIC METABOLIC PANEL
BUN: 64 mg/dL — ABNORMAL HIGH (ref 6–23)
Chloride: 97 mEq/L (ref 96–112)
Glucose, Bld: 107 mg/dL — ABNORMAL HIGH (ref 70–99)
Potassium: 4.4 mEq/L (ref 3.5–5.1)

## 2011-05-16 LAB — CBC
HCT: 35.9 % — ABNORMAL LOW (ref 39.0–52.0)
Hemoglobin: 11.3 g/dL — ABNORMAL LOW (ref 13.0–17.0)
MCHC: 31.5 g/dL (ref 30.0–36.0)

## 2011-05-16 MED ORDER — HEPARIN SODIUM (PORCINE) 1000 UNIT/ML DIALYSIS
20.0000 [IU]/kg | INTRAMUSCULAR | Status: DC | PRN
  Administered 2011-05-17: 1300 [IU] via INTRAVENOUS_CENTRAL
  Filled 2011-05-16: qty 2

## 2011-05-16 NOTE — Progress Notes (Addendum)
[facility]    Subjective: Asleep, wife at bedside, awakens easily, no further coughing  Objective: Vital signs in last 24 hours: Blood pressure 161/62, pulse 60, temperature 97.4 F (36.3 C), temperature source Oral, resp. rate 18, height 5\' 9"  (1.753 m), weight 67.2 kg (148 lb 2.4 oz), SpO2 95.00%.    PHYSICAL EXAM General--awake, friendly Chest--clear, cath R IJ Heart--no rub Abd--nontender Extr--L AKA, no edema on R  Lab Results:   Lab 05/16/11 0505 05/15/11 1001 05/14/11 0211  NA 137 139 141  K 4.4 4.4 4.4  CL 97 97 101  CO2 23 27 24   BUN 64* 38* 43*  CREATININE 6.64* 4.81* 5.73*  EGFR -- -- --  GLUCOSE 107* -- --  CALCIUM 9.4 9.5 9.9  PHOS -- -- --     Basename 05/16/11 0505 05/15/11 1001  WBC 9.8 10.1  HGB 11.3* 12.4*  HCT 35.9* 38.8*  PLT 180 163     Scheduled:   . amLODipine  10 mg Oral Daily  . aspirin EC  81 mg Oral Daily  . azithromycin  250 mg Oral Daily  . calcium acetate  1,334 mg Oral TID WC  . carvedilol  6.25 mg Oral BID WC  . cefTRIAXone (ROCEPHIN)  IV  1 g Intravenous Q24H  . cinacalcet  30 mg Oral Q breakfast  . docusate sodium  100 mg Oral BID  . heparin  5,000 Units Subcutaneous Q8H  . isosorbide mononitrate  60 mg Oral Daily  . multivitamin  1 tablet Oral QHS  . senna  1 tablet Oral BID  . simvastatin  10 mg Oral q1800  . sodium chloride  3 mL Intravenous Q12H   Continuous:   Assessment/Plan: 1. SOB due CAP and/or excess volume - CXR show resolution of changes seen on admission--this would be more in line with fluid overload rather than pneumonia.  Ok with me to d/c antibiotics 2. ESRD - MWF - lower EDW.  Weight yesterday post HD 63.6 kg.  EDW 67.5 kg. 3. Anemia - Hgb 11.3 today  4. Secondary hyperparathyroidism - continue current meds  5. HTN/volume - BP 161/62; would continue to challenge volume at next dialysis; on norvasc and coreg  6. Nutrition - renal diet  HD Monday,  Wt to 63 kg if possible.  Renal  profile and CBC in HD    LOS: 2 days   Rasha Ibe F 05/16/2011,11:22 AM   .labalb

## 2011-05-16 NOTE — Progress Notes (Signed)
Patient ID: Logan [name], male   DOB: 07-08-1919, 78 y.o.   MRN: [id number]  Assessment/Plan:   Principal Problem:   *CAP (community acquired pneumonia)  - treat with azithromycin (D/C ceftriaxone)  - follow up blood cultures results - no growth to date  Active Problems:   Hypoxia  - likely seocndary to Community acquired pneumonia  - saturating 95% on room air   ESRD (end stage renal disease) on dialysis  - as per renal schedule  - HD Monday  Leukocytosis  - Perhaps secondary to pneumonia versus UTI  - continue antibiotic as above   EDUCATION  - test results and diagnostic studies were discussed with patient at the bedside  - questions were answered at the bedside and contact information was provided for additional questions or concerns  - Anticipated discharge Monday 05/17/2011   Subjective: No events overnight. Patient denies chest pain, shortness of breath, abdominal pain.   Objective:  Vital signs in last 24 hours:  Filed Vitals:   05/15/11 1100 05/15/11 1528 05/15/11 2218 05/16/11 0444  BP: 140/60 142/50 118/47 161/62  Pulse: 56 59 61 60  Temp:  98.2 F (36.8 C) 99.4 F (37.4 C) 97.4 F (36.3 C)  TempSrc:  Oral Oral Oral  Resp:  19 20 18   Height:      Weight:    67.2 kg (148 lb 2.4 oz)  SpO2:  91% 93% 95%    Intake/Output from previous day:   Intake/Output Summary (Last 24 hours) at 05/16/11 1245 Last data filed at 05/16/11 0924  Gross per 24 hour  Intake    890 ml  Output      0 ml  Net    890 ml    Physical Exam: General: Alert, awake, oriented x3, in no acute distress. HEENT: No bruits, no goiter. Moist mucous membranes, no scleral icterus, no conjunctival pallor. Heart: Regular rate and rhythm, S1/S2 +, no murmurs, rubs, gallops. Lungs: Clear to auscultation bilaterally. No wheezing, no rhonchi, no rales.  Abdomen: Soft, nontender, nondistended, positive bowel sounds. Extremities: left BKA Neuro: Grossly nonfocal.  Lab  Results:  Basic Metabolic Panel:    Component Value Date/Time   NA 137 05/16/2011 0505   K 4.4 05/16/2011 0505   CL 97 05/16/2011 0505   CO2 23 05/16/2011 0505   BUN 64* 05/16/2011 0505   CREATININE 6.64* 05/16/2011 0505   GLUCOSE 107* 05/16/2011 0505   CALCIUM 9.4 05/16/2011 0505   CBC:    Component Value Date/Time   WBC 9.8 05/16/2011 0505   HGB 11.3* 05/16/2011 0505   HCT 35.9* 05/16/2011 0505   PLT 180 05/16/2011 0505   MCV 100.6* 05/16/2011 0505   NEUTROABS 8.6* 05/14/2011 0211   LYMPHSABS 1.7 05/14/2011 0211   MONOABS 1.2* 05/14/2011 0211   EOSABS 0.6 05/14/2011 0211   BASOSABS 0.0 05/14/2011 0211      Lab 05/16/11 0505 05/15/11 1001 05/14/11 0211  WBC 9.8 10.1 12.2*  HGB 11.3* 12.4* 12.0*  HCT 35.9* 38.8* 37.2*  PLT 180 163 222  MCV 100.6* 102.1* 101.9*  MCH 31.7 32.6 32.9  MCHC 31.5 32.0 32.3  RDW 19.3* 19.9* 19.9*  LYMPHSABS -- -- 1.7  MONOABS -- -- 1.2*  EOSABS -- -- 0.6  BASOSABS -- -- 0.0  BANDABS -- -- --    Lab 05/16/11 0505 05/15/11 1001 05/14/11 0211  NA 137 139 141  K 4.4 4.4 4.4  CL 97 97 101  CO2 23 27 24   GLUCOSE 107*  156* 91  BUN 64* 38* 43*  CREATININE 6.64* 4.81* 5.73*  CALCIUM 9.4 9.5 9.9  MG -- -- --   No results found for this basename: INR:5,PROTIME:5 in the last 168 hours Cardiac markers:  Lab 05/14/11 0224  CKMB 3.5  TROPONINI <0.30  MYOGLOBIN --   No components found with this basename: POCBNP:3 Recent Results (from the past 240 hour(s))  MRSA PCR SCREENING     Status: Normal   Collection Time   05/14/11  8:08 AM      Component Value Range Status Comment   MRSA by PCR NEGATIVE  NEGATIVE  Final     Studies/Results: Dg Chest 2 View  05/15/2011  *RADIOLOGY REPORT*  Clinical Data: Chest pain.  Shortness of breath.  Generalized weakness.  Post dialysis.  CHEST - 2 VIEW 05/15/2011:  Comparison: Portable chest x-ray yesterday, 12/22/2010, 12/16/2010 [facility].  Findings: Right jugular dialysis catheter tips remain in the mid  and lower SVC.  Interval resolution of the interstitial pulmonary edema since yesterday.  Persistent small bilateral pleural effusions.  Stable pleuroparenchymal scarring laterally in the right mid chest and at the right base.  No new pulmonary parenchymal abnormalities.  Cardiac silhouette enlarged but stable. Thoracic aorta tortuous atherosclerotic, unchanged.  IMPRESSION:  1.  Resolution of interstitial pulmonary edema post dialysis. 2.  Residual very small bilateral pleural effusions.  Residual small bilateral pleural effusions.  No acute cardiopulmonary disease otherwise. 3.  Stable pleuroparenchymal scarring involving the right mid lung and right base.  Original Report Authenticated By: Arnell Sieving, M.D.    Medications: Scheduled Meds:   . amLODipine  10 mg Oral Daily  . aspirin EC  81 mg Oral Daily  . azithromycin  250 mg Oral Daily  . calcium acetate  1,334 mg Oral TID WC  . carvedilol  6.25 mg Oral BID WC  . cefTRIAXone (ROCEPHIN)  IV  1 g Intravenous Q24H  . cinacalcet  30 mg Oral Q breakfast  . docusate sodium  100 mg Oral BID  . heparin  5,000 Units Subcutaneous Q8H  . isosorbide mononitrate  60 mg Oral Daily  . multivitamin  1 tablet Oral QHS  . senna  1 tablet Oral BID  . simvastatin  10 mg Oral q1800  . sodium chloride  3 mL Intravenous Q12H     EDUCATION - test results and diagnostic studies were discussed with patient and pt's family who was present at the bedside - patient and family have verbalized the understanding - questions were answered at the bedside and contact information was provided for additional questions or concerns   LOS: 2 days   Amelia Macken 05/16/2011, 12:45 PM  [facility] Pager: [phone]

## 2011-05-17 ENCOUNTER — Inpatient Hospital Stay (HOSPITAL_COMMUNITY): Payer: Medicare Other

## 2011-05-17 LAB — RENAL FUNCTION PANEL
BUN: 93 mg/dL — ABNORMAL HIGH (ref 6–23)
Calcium: 9.3 mg/dL (ref 8.4–10.5)
GFR calc Af Amer: 6 mL/min — ABNORMAL LOW (ref >90)
Glucose, Bld: 101 mg/dL — ABNORMAL HIGH (ref 70–99)
Phosphorus: 6.8 mg/dL — ABNORMAL HIGH (ref 2.3–4.6)
Sodium: 136 mEq/L (ref 135–145)

## 2011-05-17 LAB — GLUCOSE, CAPILLARY: Glucose-Capillary: 99 mg/dL (ref 70–99)

## 2011-05-17 LAB — CBC: Platelets: 197 10*3/uL (ref 150–400)

## 2011-05-17 MED ORDER — CALCIUM ACETATE 667 MG PO CAPS: 1334.0000 mg | ORAL_CAPSULE | Freq: Three times a day (TID) | ORAL | Status: AC

## 2011-05-17 MED ORDER — MOXIFLOXACIN HCL 400 MG PO TABS: 400.0000 mg | ORAL_TABLET | Freq: Every day | ORAL | Status: AC

## 2011-05-17 MED ORDER — ALBUTEROL SULFATE (5 MG/ML) 0.5% IN NEBU: 2.5000 mg | INHALATION_SOLUTION | Freq: Four times a day (QID) | RESPIRATORY_TRACT | Status: DC | PRN

## 2011-05-17 MED ORDER — ISOSORBIDE MONONITRATE ER 60 MG PO TB24: 60.0000 mg | ORAL_TABLET | Freq: Every day | ORAL | Status: AC

## 2011-05-17 MED ORDER — DSS 100 MG PO CAPS: 100.0000 mg | ORAL_CAPSULE | Freq: Two times a day (BID) | ORAL | Status: AC

## 2011-05-17 MED ORDER — SENNA 8.6 MG PO TABS
1.0000 | ORAL_TABLET | Freq: Two times a day (BID) | ORAL | Status: DC
Start: 2011-05-17 — End: 2013-03-27

## 2011-05-17 NOTE — Progress Notes (Signed)
Patient report has been given to Hemodialysis nurse, patient is being transported to Dialysis; patient is stable___________________________________________________________________________ D. Manson Passey RN

## 2011-05-17 NOTE — Progress Notes (Signed)
   CARE MANAGEMENT NOTE 05/17/2011  Patient:  Logan [name], Logan [name]   Account Number:  [id number]  Date Initiated:  05/14/2011  Documentation initiated by:  Donn Pierini  Subjective/Objective Assessment:   Pt admitted with SOB hypoxia     Action/Plan:   PTA pt lived at home with spouse, PT eval ordered   Anticipated DC Date:  05/17/2011   Anticipated DC Plan:  HOME W HOME HEALTH SERVICES      DC Planning Services  CM consult      [facility] Choice  HOME HEALTH   Choice offered to / List presented to:  C-3 Spouse        HH arranged  HH - 11 Patient Refused      Status of service:  In process, will continue to follow Medicare Important Message given?   (If response is "NO", the following Medicare IM given date fields will be blank) Date Medicare IM given:   Date Additional Medicare IM given:    Discharge Disposition:    Per UR Regulation:    Comments:  PCP- Eliott Nine   05/17/11 Darlyne Russian RN, CCM Spoke with spouse regarding discharge planning: home health needs and recommendations for PT evaluation.  She stated they live at [facility] and the patient goes to the [facility] Tuesday, Thursday and Saturday to use the exercise equipment.  She noted the patient also does exercises at the bedside. She declines a home health referral for PT. She states they have DME and denies any equipment needs. The patient is followed by Dr Camille Bal at Dialysis.   05/14/11- 1530- Donn Pierini RN, BSN [phone] Attempted to see pt- pt in HD, no family present in room. Per PT notes wife would like to take pt home with [facility] if confusion clears. CM to follow up for discharge planning and needs.

## 2011-05-17 NOTE — Discharge Instructions (Signed)

## 2011-05-17 NOTE — Progress Notes (Signed)
Utilization review complete 

## 2011-05-17 NOTE — Progress Notes (Signed)
   CARE MANAGEMENT NOTE 05/17/2011  Patient:  Logan [name], Logan [name]   Account Number:  [id number]  Date Initiated:  05/14/2011  Documentation initiated by:  Donn Pierini  Subjective/Objective Assessment:   Pt admitted with SOB hypoxia     Action/Plan:   PTA pt lived at home with spouse, PT eval ordered   Anticipated DC Date:  05/17/2011   Anticipated DC Plan:  HOME W HOME HEALTH SERVICES      DC Planning Services  CM consult      [facility] Choice  HOME HEALTH   Choice offered to / List presented to:  C-3 Spouse        HH arranged  HH - 11 Patient Refused      Status of service:  Completed, signed off Medicare Important Message given?   (If response is "NO", the following Medicare IM given date fields will be blank) Date Medicare IM given:   Date Additional Medicare IM given:    Discharge Disposition:  HOME/SELF CARE  Per UR Regulation:    Comments:  PCP- Eliott Nine  05/17/2011  2:08pm Darlyne Russian RN, CCM MD order for discharge to home with home health PT services. Discussed with spouse the discharge order and order for home health PT.  Spouse stated they decline home PT due to the exercise equipment they use at [facility].   05/17/11 Darlyne Russian RN, CCM Spoke with spouse regarding discharge planning: home health needs and recommendations for PT evaluation.  She stated they live at [facility] and the patient goes to the [facility] Tuesday, Thursday and Saturday to use the exercise equipment.  She noted the patient also does exercises at the bedside. She declines a home health referral for PT. She states they have DME and denies any equipment needs. The patient is followed by Dr Camille Bal at Dialysis.   05/14/11- 1530- Donn Pierini RN, BSN [phone] Attempted to see pt- pt in HD, no family present in room. Per PT notes wife would like to take pt home with [facility] if confusion clears. CM to follow up for discharge planning and needs.

## 2011-05-17 NOTE — Discharge Summary (Signed)
Patient ID: Logan [name] MRN: [id number] DOB/AGE: 08/17/1919 78 y.o.  Admit date: 05/14/2011 Discharge date: 05/17/2011  Primary Care Physician:  Sadie Haber, MD, MD  Assessment/Plan:   Principal Problem:   *CAP (community acquired pneumonia)  - treat with avelox for 10 days on discharge - follow up blood cultures results - no growth to date   Active Problems:   Hypoxia  - likely seocndary to Community acquired pneumonia  - saturating 99% on room air   ESRD (end stage renal disease) on dialysis  - as per renal schedule  - HD Monday   Leukocytosis  - Perhaps secondary to pneumonia versus UTI  - continue antibiotic as above   EDUCATION  - test results and diagnostic studies were discussed with patient at the bedside  - questions were answered at the bedside and contact information was provided for additional questions or concerns  - Anticipated discharge today Monday 05/17/2011   Disposition  - to SNF    Medication List  As of 05/17/2011  1:06 PM   TAKE these medications         albuterol (5 MG/ML) 0.5% nebulizer solution   Commonly known as: PROVENTIL   Take 0.5 mLs (2.5 mg total) by nebulization every 6 (six) hours as needed for wheezing or shortness of breath.      amLODipine 10 MG tablet   Commonly known as: NORVASC   Take 10 mg by mouth at bedtime.      aspirin EC 81 MG tablet   Take 81 mg by mouth at bedtime.      calcium acetate 667 MG capsule   Commonly known as: PHOSLO   Take 1,334-2,001 mg by mouth 3 (three) times daily with meals. Take 3 capsules by mouth in the morning with breakfast, and take 2 capsules by mouth at lunch and supper.      calcium acetate 667 MG capsule   Commonly known as: PHOSLO   Take 2 capsules (1,334 mg total) by mouth 3 (three) times daily with meals.      carisoprodol 350 MG tablet   Commonly known as: SOMA   Take 350 mg by mouth 4 (four) times daily as needed. For cramps      carvedilol 6.25 MG tablet   Commonly known as: COREG   Take 6.25 mg by mouth 2 (two) times daily with a meal.      cinacalcet 30 MG tablet   Commonly known as: SENSIPAR   Take 30 mg by mouth every evening.      DSS 100 MG Caps   Take 100 mg by mouth 2 (two) times daily.      isosorbide mononitrate 60 MG 24 hr tablet   Commonly known as: IMDUR   Take 60 mg by mouth daily.      isosorbide mononitrate 60 MG 24 hr tablet   Commonly known as: IMDUR   Take 1 tablet (60 mg total) by mouth daily.      multivitamin Tabs tablet   Take 1 tablet by mouth at bedtime.      senna 8.6 MG Tabs   Commonly known as: SENOKOT   Take 1 tablet (8.6 mg total) by mouth 2 (two) times daily.      simvastatin 10 MG tablet   Commonly known as: ZOCOR   Take 10 mg by mouth at bedtime.      thiamine 100 MG tablet   Commonly known as: VITAMIN B-1   Take 100 mg by  mouth at bedtime.            Disposition and Follow-up:  - please follow up with PCP in 1 week  Consults:  1. Physical therapy 2. Nephrology  Significant Diagnostic Studies:  Dg Chest Portable 1 View 05/14/2011  *RADIOLOGY REPORT*  Clinical Data: Persistent cough.  PORTABLE CHEST - 1 VIEW  Comparison: Chest radiograph performed 12/22/2010  Findings: The lungs are relatively well-aerated.  Right-sided pleural-parenchymal scarring is unchanged in appearance.  Mild left basilar airspace opacity appears slightly more prominent; mild pneumonia cannot be excluded.  No definite pleural effusion or pneumothorax is seen.  Mild chronic vascular congestion is noted.  The cardiomediastinal silhouette is borderline enlarged; a right- sided dual lumen catheter is noted ending about the cavoatrial junction.  No acute osseous abnormalities are seen.  IMPRESSION:  1.  Mildly more prominent left basilar airspace opacity; mild superimposed pneumonia cannot be excluded. 2.  Stable appearance to right-sided pleural-parenchymal scarring. 3.  Borderline cardiomegaly and mild chronic vascular  congestion.  Original Report Authenticated By: Tonia Ghent, M.D.    Brief H and P: 78WUJ with h/o CAD via abnormal stress test 2005, ESRD on HD (MWF), CVA, PVD presents with nasal congestion, cough, hypoxia, minimal leukcytosis, possible opacity on CXR. Despite numerous comorbidities and advanced age, pt has not been medically admitted for quite awhile, with several admission in 2012 mostly for vascular HD access issues. He is a poor historian, but can relate that he comes in because at home he was unable to breath due to nasal congestion, and has also been having a few days of minimally productive cough. In the ED he was initially hypoxic upper 80's, put on O2 with increased appropriately. Labs with renal 43/5.73, normal LFT's. Negative cardiac enzymes x1. BNP 12k. WBC 12.2 with normal diff, Hct 37.2. CXR with mildly more prominent left basilar opacity, mildly superimposed PNA not excluded; stable right pleural parenchymal scarring; borderline cardiomegaly and mild chronic vascular congestion.    Physical Exam on Discharge:  Filed Vitals:   05/17/11 1200 05/17/11 1215 05/17/11 1232 05/17/11 1256  BP: 162/56 146/66 123/55 124/61  Pulse: 53 55 55 57  Temp:      TempSrc:      Resp: 15 16 11 12   Height:      Weight:      SpO2: 99% 98%  99%     Intake/Output Summary (Last 24 hours) at 05/17/11 1306 Last data filed at 05/17/11 0911  Gross per 24 hour  Intake    240 ml  Output      0 ml  Net    240 ml    General:  no acute distress. HEENT: No bruits, no goiter. Heart: Regular rate and rhythm, without murmurs, rubs, gallops. Lungs: Clear to auscultation bilaterally. Abdomen: Soft, nontender, nondistended, positive bowel sounds. Extremities: Left BKA Neuro: Grossly intact, nonfocal.  CBC:    Component Value Date/Time   WBC 9.8 05/16/2011 0505   HGB 11.3* 05/16/2011 0505   HCT 35.9* 05/16/2011 0505   PLT 180 05/16/2011 0505   MCV 100.6* 05/16/2011 0505   NEUTROABS 8.6* 05/14/2011  0211   LYMPHSABS 1.7 05/14/2011 0211   MONOABS 1.2* 05/14/2011 0211   EOSABS 0.6 05/14/2011 0211   BASOSABS 0.0 05/14/2011 0211    Basic Metabolic Panel:    Component Value Date/Time   NA 137 05/16/2011 0505   K 4.4 05/16/2011 0505   CL 97 05/16/2011 0505   CO2 23 05/16/2011 0505  BUN 64* 05/16/2011 0505   CREATININE 6.64* 05/16/2011 0505   GLUCOSE 107* 05/16/2011 0505   CALCIUM 9.4 05/16/2011 0505     Time spent on Discharge: Greater than 30 minutes  Signed: Kaybree Williams 05/17/2011, 1:06 PM

## 2011-05-17 NOTE — Progress Notes (Signed)
Patient IV and tele d/c, patient's wife and daughter verbalizes understanding of discharge instructions and medications, patient is leaving unit at this moment____________________________________D. Manson Passey RN

## 2011-05-17 NOTE — Progress Notes (Signed)
[facility] Progress Note Subjective:  Feeling better, but chest hurts a little from coughing.  Objective Filed Vitals:   05/17/11 1141 05/17/11 1200 05/17/11 1215 05/17/11 1232  BP: 172/68 162/56 146/66 123/55  Pulse: 53 53 55 55  Temp: 97.2 F (36.2 C)     TempSrc: Oral     Resp: 21 15 16 11   Height:      Weight: 68.6 kg (151 lb 3.8 oz)     SpO2: 94% 99% 98%    Physical Exam: dozing, but rousable BP 123/55 P 55 Wt 68.6 (EDW 67.5) goal 4.5; post HD wt 63.6  2/22 - may have been a false low General: rouses easily, pleasant Heart: bradycardia - usual for him during HD treatments Lungs: grossly clear Abdomen: soft Extremities: no LE edema Dialysis Access:  Right I-J  Problems/Plans: 1. SOB due CAP and/or excess volume - CXR show resolution of changes seen on admission--this would be more in line with fluid overload rather than pneumonia. Still on IV rocephin and po azithromycin 2. ESRD - MWF - lower EDW. Weight of 2/23  Of 63.6 likely error;  EDW 67.5 kg previously,  Decrease goal today to 3; even this might be too much. Suspect EDW needs to be around 66 or 66.5 3. Anemia - Hgb 11.3 today  4. Secondary hyperparathyroidism - continue current meds  5. HTN/volume - Lowering EDW; continues on norvasc and coreg - eval EDW post dialysis.  6. Nutrition - renal diet  Additional Objective Labs pending for today: Basic Metabolic Panel:  Lab 05/16/11 1191 05/15/11 1001 05/14/11 0211  NA 137 139 141  K 4.4 4.4 4.4  CL 97 97 101  CO2 23 27 24   GLUCOSE 107* 156* 91  BUN 64* 38* 43*  CREATININE 6.64* 4.81* 5.73*  CALCIUM 9.4 9.5 9.9  ALB -- -- --  PHOS -- -- --   Liver Function Tests:  Lab 05/14/11 0211  AST 15  ALT 9  ALKPHOS 83  BILITOT 0.3  PROT 6.4  ALBUMIN 3.6   CBC:  Lab 05/16/11 0505 05/15/11 1001 05/14/11 0211  WBC 9.8 10.1 12.2*  NEUTROABS -- -- 8.6*  HGB 11.3* 12.4* 12.0*  HCT 35.9* 38.8* 37.2*  MCV 100.6* 102.1* 101.9*  PLT 180 163 222    Cardiac Enzymes:  Lab 05/14/11 0224  CKTOTAL 50  CKMB 3.5  CKMBINDEX --  TROPONINI <0.30   CBG:  Lab 05/17/11 0610 05/16/11 0603 05/15/11 0550 05/14/11 0901  GLUCAP 99 119* 95 122*  Medications:      . amLODipine  10 mg Oral Daily  . aspirin EC  81 mg Oral Daily  . azithromycin  250 mg Oral Daily  . calcium acetate  1,334 mg Oral TID WC  . carvedilol  6.25 mg Oral BID WC  . cefTRIAXone (ROCEPHIN)  IV  1 g Intravenous Q24H  . cinacalcet  30 mg Oral Q breakfast  . docusate sodium  100 mg Oral BID  . heparin  5,000 Units Subcutaneous Q8H  . isosorbide mononitrate  60 mg Oral Daily  . multivitamin  1 tablet Oral QHS  . senna  1 tablet Oral BID  . simvastatin  10 mg Oral q1800  . sodium chloride  3 mL Intravenous Q12H    I  have reviewed scheduled and prn medications.  Sheffield Slider, PA-C [facility] Beeper [phone]  05/17/2011,12:44 PM  LOS: 3 days   Patient seen and examined and agree with assessment and  plan as above.   Vinson Moselle  MD [employer] [phone] pgr    [phone] cell 05/17/2011, 3:39 PM

## 2011-05-17 NOTE — Progress Notes (Addendum)
Physical Therapy Treatment Patient Details Name: Logan [name] MRN: [id number] DOB: [birth year] Today's Date: 05/17/2011  PT Assessment/Plan  PT - Assessment/Plan Comments on Treatment Session: Pt performed sliding board transfers from bed<>recliner today.  Pt's wife states that is all he does at home.  Required +2 (A) to transition- RN assisted with transfers.   PT Frequency: Min 3X/week Follow Up Recommendations: Home health PT Equipment Recommended: None recommended by PT PT Goals  Acute Rehab PT Goals PT Goal: Supine/Side to Sit - Progress: Not met PT Transfer Goal: Bed to Chair/Chair to Bed - Progress: Not met  PT Treatment Precautions/Restrictions  Precautions Precautions: Fall Required Braces or Orthoses: No (Has prosthesis but never wears.) Restrictions Weight Bearing Restrictions: No LLE Weight Bearing:  (L AKA) Other Position/Activity Restrictions: L AKA Mobility (including Balance) Bed Mobility Supine to Sit: 4: Min assist;HOB flat Supine to Sit Details (indicate cue type and reason): (A) to lift shoulders/trunk to sitting upright.  Pt's wife present & demonstrated difficulty with assisting pt to sitting upright.  Educated wife on safe technique to (A) pt.   Sitting - Scoot to Edge of Bed: 6: Modified independent (Device/Increase time) Transfers Lateral/Scoot Transfers: 1: +2 Total assist;With slide board;With armrests removed (pt=50%. ) Lateral/Scoot Transfer Details (indicate cue type and reason): utilized sliding board for transfer due to pt's wife stating that is all he uses at home.  Pt required +2 (A) to transfer from bed<>recliner.  (A) for set-up, scooting hips laterally, & safety.   Ambulation/Gait Ambulation/Gait: No Stairs: No Wheelchair Mobility Wheelchair Mobility: No  Posture/Postural Control Posture/Postural Control: No significant limitations Exercise    End of Session PT - End of Session Equipment Utilized During Treatment: Gait belt;Sliding  board Activity Tolerance: Patient tolerated treatment well Patient left: in bed;with family/visitor present (going for Dialysis) Nurse Communication: Mobility status for transfers General Behavior During Session: [facility] for tasks performed Cognition: [facility] for tasks performed  Lara Mulch 05/17/2011, 3:03 PM [phone]

## 2011-09-04 ENCOUNTER — Ambulatory Visit (INDEPENDENT_AMBULATORY_CARE_PROVIDER_SITE_OTHER): Payer: Medicare Other | Admitting: Family Medicine

## 2011-09-04 ENCOUNTER — Ambulatory Visit: Payer: Medicare Other

## 2011-09-04 VITALS — BP 125/52 | HR 64 | Temp 98.0°F | Resp 16 | Ht 70.0 in | Wt 150.0 lb

## 2011-09-04 DIAGNOSIS — IMO0002 Reserved for concepts with insufficient information to code with codable children: Secondary | ICD-10-CM

## 2011-09-04 MED ORDER — CEPHALEXIN 500 MG PO CAPS: 1000.0000 mg | ORAL_CAPSULE | Freq: Two times a day (BID) | ORAL | Status: DC

## 2011-09-04 MED ORDER — CEPHALEXIN 500 MG PO CAPS: 500.0000 mg | ORAL_CAPSULE | Freq: Every day | ORAL | Status: AC

## 2011-09-04 NOTE — Patient Instructions (Addendum)
Take one antibiotic pill a day.  On dialysis days take it after your dialysis  Soak the finger once or twice a day in warm water for the next several days- after soaking squeeze the finger gently to express any pus and put on a band- aid.

## 2011-09-04 NOTE — Progress Notes (Signed)
Patient Name: Logan [name] Date of Birth: 1919/05/29 Medical Record Number: [id number] Gender: male Date of Encounter: 09/04/2011  History of Present Illness:  CULLEY HEDEEN is a 78 y.o. very pleasant male patient who presents with the following:  He is a new patient to see [place] today.  History of multiple medical problems.  Here today with his wife and daughter.  He has swelling of his left thumb by the nail.  It is painful and red.  It has been this way for about 4 days.  He tends to bite his fingernails.  The finger also seems to be "bent." He states that usually he can straighten the IP joint of the thumb, but currently he cannot.  He also has a small wound on his right knee for about one week.  On his right cheek there has been a skin lesion for about one month.  He does have a dermatologist   History of left leg amputation, in a w/c.  Also dialysis.    Patient Active Problem List  Diagnosis  . CAP (community acquired pneumonia)  . Hypoxia  . ESRD (end stage renal disease) on dialysis  . Leukocytosis   Past Medical History  Diagnosis Date  . Hypertension   . ESRD on dialysis     MONDAY, WEDNESDAY, and FRIDAY  . Stroke 2005  . Anemia   . Secondary hyperparathyroidism   . Peripheral vascular disease, unspecified   . Basal cell carcinoma   . BPH (benign prostatic hypertrophy)   . History of Clostridium difficile infection   . Cataract   . Abnormal nuclear stress test 2005    Mild distal anterior ischemia on nuclear test  . Shortness of breath    Past Surgical History  Procedure Date  . Insertion of dialysis catheter 09/27/2010    Left IJ Diatek Catheter  . Above knee leg amputation 12/26/2004    Left AKA by Dr. Darrick Penna  . Tonsillectomy and adenoidectomy   . Thrombectomy / arteriovenous graft revision     Numerous revision and declots  . Femoral bypass 2005    Left Femoral to posterior tibial BPG w/ composite PTFE and vein  . Hernia repair   .  Appendectomy    History  Substance Use Topics  . Smoking status: Never Smoker   . Smokeless tobacco: Never Used  . Alcohol Use: No   Family History  Problem Relation Age of Onset  . Heart disease Maternal Uncle   . Heart attack Father     49's   . Heart attack Sister     57's    No Known Allergies  Medication list has been reviewed and updated.  Prior to Admission medications   Medication Sig Start Date End Date Taking? Authorizing Provider  albuterol (PROVENTIL) (5 MG/ML) 0.5% nebulizer solution Take 0.5 mLs (2.5 mg total) by nebulization every 6 (six) hours as needed for wheezing or shortness of breath. 05/17/11 05/16/12 Yes Alison Murray, MD  amLODipine (NORVASC) 10 MG tablet Take 10 mg by mouth at bedtime.   Yes Historical Provider, MD  aspirin EC 81 MG tablet Take 81 mg by mouth at bedtime.   Yes Historical Provider, MD  calcium acetate (PHOSLO) 667 MG capsule Take 2 capsules (1,334 mg total) by mouth 3 (three) times daily with meals. 05/17/11 05/16/12 Yes Alison Murray, MD  carisoprodol (SOMA) 350 MG tablet Take 350 mg by mouth 4 (four) times daily as needed. For cramps  Yes Historical Provider, MD  carvedilol (COREG) 6.25 MG tablet Take 6.25 mg by mouth 2 (two) times daily with a meal.   Yes Historical Provider, MD  cinacalcet (SENSIPAR) 30 MG tablet Take 30 mg by mouth every evening.   Yes Historical Provider, MD  isosorbide mononitrate (IMDUR) 60 MG 24 hr tablet Take 60 mg by mouth daily.   Yes Historical Provider, MD  multivitamin (RENA-VIT) TABS tablet Take 1 tablet by mouth at bedtime.   Yes Historical Provider, MD  senna (SENOKOT) 8.6 MG TABS Take 1 tablet (8.6 mg total) by mouth 2 (two) times daily. 05/17/11  Yes Alison Murray, MD  simvastatin (ZOCOR) 10 MG tablet Take 10 mg by mouth at bedtime.   Yes Historical Provider, MD  thiamine (VITAMIN B-1) 100 MG tablet Take 100 mg by mouth at bedtime.   Yes Historical Provider, MD  calcium acetate (PHOSLO) 667 MG capsule Take  1,334-2,001 mg by mouth 3 (three) times daily with meals. Takes 2 capsules by mouth tid with meals.    Historical Provider, MD  isosorbide mononitrate (IMDUR) 60 MG 24 hr tablet Take 1 tablet (60 mg total) by mouth daily. 05/17/11 05/16/12  Alison Murray, MD    Review of Systems:  As per HPI- otherwise negative.   Physical Examination: Filed Vitals:   09/04/11 1524  BP: 125/52  Pulse: 64  Temp: 98 F (36.7 C)  Resp: 16   Filed Vitals:   09/04/11 1524  Height: 5\' 10"  (1.778 m)  Weight: 150 lb (68.04 kg)   Body mass index is 21.52 kg/(m^2). Ideal Body Weight: Weight in (lb) to have BMI = 25: 173.9   GEN: WDWN, NAD, Non-toxic, A & O x 3 HEENT: Atraumatic, Normocephalic. Neck supple. No masses, No LAD. Ears and Nose: No external deformity. CV: RRR, No M/G/R. No JVD. No thrill. No extra heart sounds. PULM: CTA B, no wheezes, crackles, rhonchi. No retractions. No resp. distress. No accessory muscle use. ABD: S, NT, ND, +BS. No rebound. No HSM. EXTR: No c/c/e. Left leg amputation.  NEURO wheelchair.   Normal strength and movement of arms.  PSYCH: Normally interactive. Conversant. Not depressed or anxious appearing.  Calm demeanor.  Left thumb: there is a visible paronychia on the lateral border on the nail.  On the anterior right knee there is a small abrasion with a scab.  On the right cheek is a skin lesion which could be skin cancer. (not melanoma)  VC obtained.  Cleaned paronychia area with alcohol and I and D with 11 blade.  Pus expressed and cultured.  [facility] reading (PRIMARY) by  Dr. Patsy Lager.  Left hand (history: paronychia at thumb, no known injury but pt says the IP joint is usually straight and today it is bent- however appearance is consistent with chronic OA)  Xray shows severe OA especially in thumb, otherwise negative  LEFT HAND - COMPLETE 3+ VIEW  Comparison: None.  Findings: There is diffuse osteoarthritis of the interphalangeal joints of all of the digits, most  severe at the interphalangeal joint of the thumb. There is erosion and subluxation at the first carpal metacarpal joint. There is a deformity of the base of the proximal phalangeal bone of the thumb may be due to remote fracture.  IMPRESSION: No acute abnormalities. Severe arthritic changes of the first carpal metacarpal joint and to a lesser degree at the interphalangeal joint of the thumb. 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    Assessment and  Plan: 1. Paronychia  Wound culture, DG Hand Complete Left, cephALEXin (KEFLEX) 500 MG capsule, DISCONTINUED: cephALEXin (KEFLEX) 500 MG capsule   I and D of paronychia as above.  Await wound culture.  Keflex 500mg  daily (decreased renal dose).  I think that the partial flexion of the thumb IP joint is probably more chronic.  Keflex should help with tiny wound on right knee as well.  He will see dermatology concerning his facial lesion- encouraged him to be seen in the next month or so- expressed my concern about possible skin cancer.  He has a dermatologist already.   Patient (or parent if minor) instructed to return to clinic or call if not better in 2 day(s).   Abbe Amsterdam, MD

## 2011-09-07 ENCOUNTER — Encounter: Payer: Self-pay | Admitting: Family Medicine

## 2011-09-07 LAB — WOUND CULTURE
Gram Stain: NONE SEEN
Gram Stain: NONE SEEN

## 2011-10-19 ENCOUNTER — Other Ambulatory Visit: Payer: Self-pay | Admitting: Dermatology

## 2012-04-20 ENCOUNTER — Ambulatory Visit
Admission: RE | Admit: 2012-04-20 | Discharge: 2012-04-20 | Disposition: A | Discharge status: Home / Self Care | Payer: Medicare Other | Source: Ambulatory Visit | Attending: Nephrology | Admitting: Nephrology

## 2012-04-20 ENCOUNTER — Other Ambulatory Visit: Payer: Self-pay | Admitting: Nephrology

## 2012-04-20 DIAGNOSIS — R05 Cough: Secondary | ICD-10-CM

## 2012-11-14 ENCOUNTER — Ambulatory Visit (INDEPENDENT_AMBULATORY_CARE_PROVIDER_SITE_OTHER): Payer: Medicare Other | Admitting: Internal Medicine

## 2012-11-14 ENCOUNTER — Encounter: Payer: Self-pay | Admitting: Internal Medicine

## 2012-11-14 VITALS — BP 122/68 | HR 64 | Temp 98.0°F | Resp 12

## 2012-11-14 DIAGNOSIS — S78112A Complete traumatic amputation at level between left hip and knee, initial encounter: Secondary | ICD-10-CM

## 2012-11-14 DIAGNOSIS — F0150 Vascular dementia without behavioral disturbance: Secondary | ICD-10-CM | POA: Insufficient documentation

## 2012-11-14 DIAGNOSIS — N189 Chronic kidney disease, unspecified: Secondary | ICD-10-CM

## 2012-11-14 DIAGNOSIS — I129 Hypertensive chronic kidney disease with stage 1 through stage 4 chronic kidney disease, or unspecified chronic kidney disease: Secondary | ICD-10-CM

## 2012-11-14 DIAGNOSIS — S78119A Complete traumatic amputation at level between unspecified hip and knee, initial encounter: Secondary | ICD-10-CM

## 2012-11-14 DIAGNOSIS — M810 Age-related osteoporosis without current pathological fracture: Secondary | ICD-10-CM | POA: Insufficient documentation

## 2012-11-14 DIAGNOSIS — N186 End stage renal disease: Secondary | ICD-10-CM | POA: Insufficient documentation

## 2012-11-14 NOTE — Progress Notes (Signed)
Failed clocked drawing ---Given by Creig Hines

## 2012-11-14 NOTE — Progress Notes (Signed)
Patient ID: Logan [name], male   DOB: 1919-12-26, 79 y.o.   MRN: [id number]  Code status- has a living will, full code  No Known Allergies  HPI 79 y/o male patient here to establish care. He is here with his wife. He has known history of HTN and ESRD. He follows with Dr Camille Bal from renal. He sees Dr Josephina Gip at [facility] care for foot care and Dr Arminda Resides for dermatology He lives in assisted living apartment on 3rd floor with his wife. The apartment has an Engineer, structural and he uses wheelchair. Patient is able to propel himself on the wheelchair. He is oriented to person and place He goes to dialysis 3 times a week Mon/ wed/ Friday to [facility] He has hx of shingles in the past Wife is not sure about his immunization status Wife has memory problems and does not remember where he was getting his care prior His grandson is with him but does not know about his prior care. His daughter is involved in his care but is at work at present and wont be able to take the call  Review of Systems  Constitutional: Negative for fever, chills, weight loss and diaphoresis.  HENT: Negative for hearing loss, congestion and sore throat.   Eyes: Negative for blurred vision.       Corrective lenses  Respiratory: Negative for cough and shortness of breath.   Cardiovascular: Negative for chest pain, palpitations, leg swelling and PND.  Gastrointestinal: Negative for heartburn, nausea, vomiting and abdominal pain.       Appetite is good  Genitourinary: Negative for dysuria and flank pain.  Musculoskeletal: Negative for joint pain and falls.  Skin: Negative for itching and rash.  Neurological: Negative for dizziness, seizures, loss of consciousness and headaches.  Psychiatric/Behavioral: Negative for depression and memory loss. The patient does not have insomnia.    Past Medical History  Diagnosis Date  . Hypertension   . ESRD on dialysis     MONDAY, WEDNESDAY, and FRIDAY   . Stroke 2005  . Anemia   . Secondary hyperparathyroidism   . Peripheral vascular disease, unspecified   . Basal cell carcinoma   . BPH (benign prostatic hypertrophy)   . History of Clostridium difficile infection   . Cataract   . Abnormal nuclear stress test 2005    Mild distal anterior ischemia on nuclear test  . Shortness of breath     Past Surgical History  Procedure Laterality Date  . Insertion of dialysis catheter  09/27/2010    Left IJ Diatek Catheter  . Above knee leg amputation  12/26/2004    Left AKA by Dr. Darrick Penna  . Tonsillectomy and adenoidectomy    . Thrombectomy / arteriovenous graft revision      Numerous revision and declots  . Femoral bypass  2005    Left Femoral to posterior tibial BPG w/ composite PTFE and vein  . Hernia repair    . Appendectomy    . Cataract extraction      Left eye     Current Outpatient Prescriptions on File Prior to Visit  Medication Sig Dispense Refill  . amLODipine (NORVASC) 10 MG tablet Take 10 mg by mouth at bedtime.      Marland Kitchen aspirin EC 81 MG tablet Take 81 mg by mouth at bedtime.      . carisoprodol (SOMA) 350 MG tablet Take 350 mg by mouth 4 (four) times daily as needed. For cramps      .  carvedilol (COREG) 6.25 MG tablet Take 6.25 mg by mouth 2 (two) times daily with a meal.      . cinacalcet (SENSIPAR) 30 MG tablet Take 30 mg by mouth every evening.      . isosorbide mononitrate (IMDUR) 60 MG 24 hr tablet Take 1 tablet (60 mg total) by mouth daily.  30 tablet  0  . multivitamin (RENA-VIT) TABS tablet Take 1 tablet by mouth at bedtime.      . senna (SENOKOT) 8.6 MG TABS Take 1 tablet (8.6 mg total) by mouth 2 (two) times daily.  120 each  0  . simvastatin (ZOCOR) 10 MG tablet Take 10 mg by mouth at bedtime.      . thiamine (VITAMIN B-1) 100 MG tablet Take 100 mg by mouth at bedtime.       No current facility-administered medications on file prior to visit.   Family History  Problem Relation Age of Onset  . Heart disease  Maternal Uncle   . Heart attack Father     5's   . Heart attack Sister     88's    History   Social History  . Marital Status: Married    Spouse Name: N/A    Number of Children: N/A  . Years of Education: N/A   Occupational History  . Not on file.   Social History Main Topics  . Smoking status: Never Smoker   . Smokeless tobacco: Never Used  . Alcohol Use: No  . Drug Use: No  . Sexual Activity: No   Other Topics Concern  . Not on file   Social History Narrative   Retired Runner, broadcasting/film/video, married, currently living in independent living facility with his wife. Gets around via wheelchair.    BP 122/68  Pulse 64  Temp(Src) 98 F (36.7 C) (Oral)  Resp 12  Physical Exam  Constitutional: He appears well-developed and well-nourished. No distress.  HENT:  Head: Normocephalic and atraumatic.  Nose: Nose normal.  Mouth/Throat: Oropharynx is clear and moist. No oropharyngeal exudate.  Eyes: Conjunctivae and EOM are normal. Pupils are equal, round, and reactive to light.  Neck: Normal range of motion. Neck supple. No JVD present. No thyromegaly present.  Cardiovascular: Normal rate, regular rhythm, normal heart sounds and intact distal pulses.   Musculoskeletal: Normal range of motion. He exhibits no edema and no tenderness.  Left AKA. Able to feed himself. Needs assistance with bathing and toileting. Needs help with ADLs  Lymphadenopathy:    He has no cervical adenopathy.  Neurological: He is alert. No cranial nerve deficit.  Unable to follow commands for co-ordination testing. Oriented to person and place  Skin: Skin is warm and dry. No rash noted. He is not diaphoretic. No erythema.  Psychiatric: He has a normal mood and affect. His behavior is normal.    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 Will rule out reversible causes. Will discuss with family on  starting aricept/ namenda  ESRD on dialysis- continue dilaysis 3 days a week. Continue aspirin, renavit and sensipar  Hypertensive renal disease- bp well controlled at present. Continue norvasc, coreg and imdur. Continue ASA  PVD- s/p left AKA, continue aspirin, bp under control. Check cmp and lipid panel. Continue soma for muscle relaxant  Unclear about known hx of CAD or afib. Pt is on coreg and imdur  Constipation- continue senokot and colace  for now  Hyperlipidemia- continue zocor for now , check lipid panel  Will need to talk with daughter on care plan for her father  Recommended daughter to be in next offcie visit while talking to patient's grandson who is with him this visit as well

## 2012-11-15 LAB — COMPREHENSIVE METABOLIC PANEL
AST: 14 IU/L (ref 0–40)
Albumin: 3.9 g/dL (ref 3.2–4.6)
Alkaline Phosphatase: 85 IU/L (ref 39–117)
BUN/Creatinine Ratio: 7 — ABNORMAL LOW (ref 10–22)
BUN: 27 mg/dL (ref 10–36)
CO2: 30 mmol/L — ABNORMAL HIGH (ref 18–29)
Chloride: 96 mmol/L — ABNORMAL LOW (ref 97–108)
GFR calc Af Amer: 15 mL/min/{1.73_m2} — ABNORMAL LOW (ref >59)
Potassium: 4.3 mmol/L (ref 3.5–5.2)
Sodium: 144 mmol/L (ref 134–144)
Total Bilirubin: 0.2 mg/dL (ref 0.0–1.2)

## 2012-11-15 LAB — CBC WITH DIFFERENTIAL/PLATELET
Eos: 4 % (ref 0–5)
Eosinophils Absolute: 0.3 10*3/uL (ref 0.0–0.4)
Immature Grans (Abs): 0 10*3/uL (ref 0.0–0.1)
Immature Granulocytes: 0 % (ref 0–2)
Lymphs: 15 % (ref 14–46)
MCV: 100 fL — ABNORMAL HIGH (ref 79–97)
Monocytes Absolute: 1 10*3/uL — ABNORMAL HIGH (ref 0.1–0.9)
Neutrophils Relative %: 69 % (ref 40–74)
RDW: 15.5 % — ABNORMAL HIGH (ref 12.3–15.4)
WBC: 8.1 10*3/uL (ref 3.4–10.8)

## 2012-11-15 LAB — LIPID PANEL
Cholesterol, Total: 155 mg/dL (ref 100–199)
HDL: 32 mg/dL — ABNORMAL LOW (ref >39)

## 2013-01-16 ENCOUNTER — Ambulatory Visit: Payer: Medicare Other | Admitting: Internal Medicine

## 2013-01-30 ENCOUNTER — Encounter: Payer: Self-pay | Admitting: Internal Medicine

## 2013-01-30 ENCOUNTER — Encounter (INDEPENDENT_AMBULATORY_CARE_PROVIDER_SITE_OTHER): Payer: Self-pay

## 2013-01-30 ENCOUNTER — Ambulatory Visit (INDEPENDENT_AMBULATORY_CARE_PROVIDER_SITE_OTHER): Payer: Medicare Other | Admitting: Internal Medicine

## 2013-01-30 VITALS — BP 118/66 | HR 56 | Temp 98.2°F | Wt 152.9 lb

## 2013-01-30 DIAGNOSIS — R630 Anorexia: Secondary | ICD-10-CM

## 2013-01-30 DIAGNOSIS — I129 Hypertensive chronic kidney disease with stage 1 through stage 4 chronic kidney disease, or unspecified chronic kidney disease: Secondary | ICD-10-CM

## 2013-01-30 DIAGNOSIS — F0150 Vascular dementia without behavioral disturbance: Secondary | ICD-10-CM

## 2013-01-30 DIAGNOSIS — N189 Chronic kidney disease, unspecified: Secondary | ICD-10-CM

## 2013-01-30 DIAGNOSIS — S78112A Complete traumatic amputation at level between left hip and knee, initial encounter: Secondary | ICD-10-CM

## 2013-01-30 DIAGNOSIS — N186 End stage renal disease: Secondary | ICD-10-CM

## 2013-01-30 DIAGNOSIS — I498 Other specified cardiac arrhythmias: Secondary | ICD-10-CM

## 2013-01-30 DIAGNOSIS — R001 Bradycardia, unspecified: Secondary | ICD-10-CM | POA: Insufficient documentation

## 2013-01-30 DIAGNOSIS — Z23 Encounter for immunization: Secondary | ICD-10-CM | POA: Insufficient documentation

## 2013-01-30 DIAGNOSIS — M810 Age-related osteoporosis without current pathological fracture: Secondary | ICD-10-CM

## 2013-01-30 DIAGNOSIS — S78119A Complete traumatic amputation at level between unspecified hip and knee, initial encounter: Secondary | ICD-10-CM

## 2013-01-30 MED ORDER — CARVEDILOL 3.125 MG PO TABS: 6.2500 mg | ORAL_TABLET | Freq: Two times a day (BID) | ORAL | Status: DC

## 2013-01-30 MED ORDER — DONEPEZIL HCL 5 MG PO TABS: 5.0000 mg | ORAL_TABLET | Freq: Every day | ORAL | Status: DC

## 2013-01-30 MED ORDER — TETANUS-DIPHTHERIA TOXOIDS TD 2-2 LF/0.5ML IM SUSP: 0.5000 mL | Freq: Once | INTRAMUSCULAR | Status: DC

## 2013-01-30 NOTE — Progress Notes (Signed)
Patient ID: Logan [name], male   DOB: 11/19/1919, 79 y.o.   MRN: [id number]  Chief Complaint  Patient presents with  . Medical Managment of Chronic Issues    2 month f/u  . Immunizations    pt's daughter will check on the Td vaccine  . Foot Problem    2nd toe has an ulcer on it.  he is seeing a Podiatrist    Code status- has a living will, full code  No Known Allergies  HPI 79 y/o male patient here for follow up visit. He has not been eating much recently but his wife follows a strict dietary restriction for him. Denies any nausea or vomiting. Denies any abdominal pain. He has been sleeping a lot recently. Increased sleepiness during daytime.  He has known history of HTN and ESRD on dialysis 3 days a week. He follows with Dr Camille Bal from renal. He sees Dr Josephina Gip at [facility] care for foot care and Dr Arminda Resides for dermatology He has been requiring oxygen towards end of dialysis session He has been having a sore on his second toe and cream has been applied, seen podiatry for this and has upcoming follow up  Of note He lives in independent living apartment on 3rd floor with his wife. The apartment has an Engineer, structural and he uses wheelchair. Patient is able to propel himself on the wheelchair. He is oriented to person and place He goes to dialysis 3 times a week Mon/ wed/ Friday to [facility]  Review of Systems  Constitutional: Negative for fever, chills, weight loss and diaphoresis. Weight is stable HENT: Negative for hearing loss, congestion and sore throat.   Eyes: Negative for blurred vision.        Corrective lenses  Respiratory: Negative for cough and shortness of breath.   Cardiovascular: Negative for chest pain, palpitations, leg swelling and PND.  Gastrointestinal: Negative for heartburn, nausea, vomiting and abdominal pain.   Genitourinary: Negative for dysuria and flank pain.  Musculoskeletal: Negative for joint pain and falls.  Skin:  Negative for itching and rash.  Neurological: Negative for dizziness, seizures, loss of consciousness and headaches.  Psychiatric/Behavioral: Negative for depression. Has memory loss. The patient does not have insomnia.   Past Medical History  Diagnosis Date  . Hypertension   . ESRD on dialysis     MONDAY, WEDNESDAY, and FRIDAY  . Stroke 2005  . Anemia   . Secondary hyperparathyroidism   . Peripheral vascular disease, unspecified   . Basal cell carcinoma   . BPH (benign prostatic hypertrophy)   . History of Clostridium difficile infection   . Cataract   . Abnormal nuclear stress test 2005    Mild distal anterior ischemia on nuclear test  . Shortness of breath    Past Surgical History  Procedure Laterality Date  . Insertion of dialysis catheter  09/27/2010    Left IJ Diatek Catheter  . Above knee leg amputation  12/26/2004    Left AKA by Dr. Darrick Penna  . Tonsillectomy and adenoidectomy    . Thrombectomy / arteriovenous graft revision      Numerous revision and declots  . Femoral bypass  2005    Left Femoral to posterior tibial BPG w/ composite PTFE and vein  . Hernia repair    . Appendectomy    . Cataract extraction      Left eye    Current Outpatient Prescriptions on File Prior to Visit  Medication Sig Dispense Refill  .  albuterol (PROVENTIL) (5 MG/ML) 0.5% nebulizer solution Take 2.5 mg by nebulization every 6 (six) hours as needed for wheezing.      Marland Kitchen aspirin EC 81 MG tablet Take 81 mg by mouth at bedtime.      . carisoprodol (SOMA) 350 MG tablet Take 350 mg by mouth 4 (four) times daily as needed. For cramps      . cinacalcet (SENSIPAR) 30 MG tablet Take 30 mg by mouth every evening.      . docusate sodium (COLACE) 100 MG capsule Take 100 mg by mouth 2 (two) times daily.       . isosorbide mononitrate (IMDUR) 60 MG 24 hr tablet Take 1 tablet (60 mg total) by mouth daily.  30 tablet  0  . multivitamin (RENA-VIT) TABS tablet Take 1 tablet by mouth at bedtime.      . senna  (SENOKOT) 8.6 MG TABS Take 1 tablet (8.6 mg total) by mouth 2 (two) times daily.  120 each  0  . simvastatin (ZOCOR) 10 MG tablet Take 10 mg by mouth at bedtime.      . thiamine (VITAMIN B-1) 100 MG tablet Take 100 mg by mouth at bedtime.       No current facility-administered medications on file prior to visit.        Physical Exam   BP 118/66  Pulse 56  Temp(Src) 98.2 F (36.8 C) (Oral)  Wt 152 lb 14.4 oz (69.355 kg)  Constitutional: He appears well-developed and well-nourished. No distress.  HENT:   Head: Normocephalic and atraumatic.   Nose: Nose normal.   Mouth/Throat: Oropharynx is clear and moist. No oropharyngeal exudate.  Eyes: Conjunctivae and EOM are normal. Pupils are equal, round, and reactive to light.  Neck: Normal range of motion. Neck supple. No JVD present. No thyromegaly present.  Cardiovascular: Normal rate, regular rhythm, normal heart sounds and intact distal pulses.   Musculoskeletal: Normal range of motion. He exhibits no edema and no tenderness.  Left AKA. Able to feed himself. Needs assistance with bathing and toileting. Needs help with ADLs  Lymphadenopathy:    He has no cervical adenopathy.  Neurological: He is alert. No cranial nerve deficit.  Unable to follow commands for co-ordination testing. Oriented to person and place  Skin: Skin is warm and dry. Healing wound in right 2nd toe. Skin tear in the right leg shimPsychiatric: He has a normal mood and affect. His behavior is normal.   Labs- CBC    Component Value Date/Time   WBC 8.1 11/14/2012 1152   WBC 8.4 05/17/2011 1223   RBC 2.98* 11/14/2012 1152   RBC 3.62* 05/17/2011 1223   HGB 9.9* 11/14/2012 1152   HCT 29.8* 11/14/2012 1152   PLT 197 05/17/2011 1223   MCV 100* 11/14/2012 1152   MCH 33.2* 11/14/2012 1152   MCH 32.3 05/17/2011 1223   MCHC 33.2 11/14/2012 1152   MCHC 32.5 05/17/2011 1223   RDW 15.5* 11/14/2012 1152   RDW 18.7* 05/17/2011 1223   LYMPHSABS 1.2 11/14/2012 1152   LYMPHSABS 1.7  05/14/2011 0211   MONOABS 1.2* 05/14/2011 0211   EOSABS 0.3 11/14/2012 1152   EOSABS 0.6 05/14/2011 0211   BASOSABS 0.0 11/14/2012 1152   BASOSABS 0.0 05/14/2011 0211    CMP     Component Value Date/Time   NA 144 11/14/2012 1152   NA 136 05/17/2011 1222   K 4.3 11/14/2012 1152   CL 96* 11/14/2012 1152   CO2 30* 11/14/2012 1152  GLUCOSE 128* 11/14/2012 1152   GLUCOSE 101* 05/17/2011 1222   BUN 27 11/14/2012 1152   BUN 93* 05/17/2011 1222   CREATININE 3.92* 11/14/2012 1152   CALCIUM 9.3 11/14/2012 1152   PROT 5.7* 11/14/2012 1152   PROT 6.4 05/14/2011 0211   ALBUMIN 3.4* 05/17/2011 1222   AST 14 11/14/2012 1152   ALT 11 11/14/2012 1152   ALKPHOS 85 11/14/2012 1152   BILITOT 0.2 11/14/2012 1152   GFRNONAA 13* 11/14/2012 1152   GFRAA 15* 11/14/2012 1152   Lipid Panel     Component Value Date/Time   CHOL  Value: 173        ATP III CLASSIFICATION:  <200     mg/dL   Desirable  161-096  mg/dL   Borderline High  >=045    mg/dL   High        4/0/9811 0625   TRIG 168* 11/14/2012 1152   HDL 32* 11/14/2012 1152   HDL 34* 05/22/2009 0625   CHOLHDL 4.8 11/14/2012 1152   CHOLHDL 5.1 05/22/2009 0625   VLDL 29 05/22/2009 0625   LDLCALC 89 11/14/2012 1152   LDLCALC  Value: 110        Total Cholesterol/HDL:CHD Risk Coronary Heart Disease Risk Table                     Men   Women  1/2 Average Risk   3.4   3.3  Average Risk       5.0   4.4  2 X Average Risk   9.6   7.1  3 X Average Risk  23.4   11.0        Use the calculated Patient Ratio above and the CHD Risk Table to determine the patient's CHD Risk.        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 Will check tsh and b12. Will also have him started on aricept 5 mg daily for now. Reassess in 2 month for now   ESRD on dialysis- continue  dilaysis 3 days a week. Continue aspirin, renavit and sensipar  Hypertensive renal disease- bp well controlled at present. Will decrease his coreg to 3.125 mg bid given his slow heart rate and continue his imdur for now. Continue aspirin  Bradycardia- with easy fatiguability, will decrease his coreg dosing, see above. Monitor clinically  PVD- s/p left AKA, continue aspirin, bp under control.   Constipation- continue senokot and colace for now  Hyperlipidemia- continue zocor for now   Poor appetite- weight is stable, his ESRD could be contributing to this  Increased somnolence- concerns for OSA given him falling asleep in the office and snoring. Continue his breathing rx for now. With adjustment to his coreg and adding his mood medication, if no improvement, consider sleep study  counselling on having pt in ALF for long term done with his daughter and wife. Questions were answered  Spent more than 40 minutes in this visit

## 2013-01-31 LAB — VITAMIN B12: Vitamin B-12: 958 pg/mL — ABNORMAL HIGH (ref 211–946)

## 2013-01-31 LAB — TSH: TSH: 2.54 u[IU]/mL (ref 0.450–4.500)

## 2013-02-05 ENCOUNTER — Other Ambulatory Visit: Payer: Self-pay | Admitting: *Deleted

## 2013-02-05 MED ORDER — CARVEDILOL 3.125 MG PO TABS: ORAL_TABLET | ORAL | Status: DC

## 2013-02-14 ENCOUNTER — Encounter: Payer: Self-pay | Admitting: Vascular Surgery

## 2013-02-28 ENCOUNTER — Encounter: Payer: Self-pay | Admitting: Vascular Surgery

## 2013-03-01 ENCOUNTER — Encounter: Payer: Self-pay | Admitting: Vascular Surgery

## 2013-03-01 ENCOUNTER — Encounter (HOSPITAL_COMMUNITY): Payer: Medicare Other

## 2013-03-01 ENCOUNTER — Other Ambulatory Visit (HOSPITAL_COMMUNITY): Payer: Medicare Other

## 2013-03-01 ENCOUNTER — Ambulatory Visit (INDEPENDENT_AMBULATORY_CARE_PROVIDER_SITE_OTHER): Payer: Medicare Other | Admitting: Vascular Surgery

## 2013-03-01 VITALS — BP 178/73 | HR 64 | Ht 70.0 in | Wt 152.0 lb

## 2013-03-01 DIAGNOSIS — N186 End stage renal disease: Secondary | ICD-10-CM

## 2013-03-01 NOTE — Progress Notes (Signed)
Patient is a 79 year old male referred by Dr. Juel Burrow for consideration of a new hemodialysis access. The patient is currently dialyzing via a right-sided catheter. He has some mild dementia. He lives in an assisted living with his wife. He was last seen in 2012 for consideration of a hemodialysis access at that time. He has previously failed bilateral forearm and upper arm grafts all of short duration. He has had a left above-knee amputation from severe peripheral arterial disease. He also has known peripheral arterial disease in his right lower extremity. He is wheelchair-bound.  Past Medical History  Diagnosis Date  . Hypertension   . ESRD on dialysis     MONDAY, WEDNESDAY, and FRIDAY  . Stroke 2005  . Anemia   . Secondary hyperparathyroidism   . Peripheral vascular disease, unspecified   . Basal cell carcinoma   . BPH (benign prostatic hypertrophy)   . History of Clostridium difficile infection   . Cataract   . Abnormal nuclear stress test 2005    Mild distal anterior ischemia on nuclear test  . Shortness of breath    Past Surgical History  Procedure Laterality Date  . Insertion of dialysis catheter  09/27/2010    Left IJ Diatek Catheter  . Above knee leg amputation  12/26/2004    Left AKA by Dr. Darrick Penna  . Tonsillectomy and adenoidectomy    . Thrombectomy / arteriovenous graft revision      Numerous revision and declots  . Femoral bypass  2005    Left Femoral to posterior tibial BPG w/ composite PTFE and vein  . Hernia repair    . Appendectomy    . Cataract extraction      Left eye   . Arteriovenous graft placement      Review of systems: The patient is unable to provide much information due to dementia.  Physical exam:  Filed Vitals:   03/01/13 0946  BP: 178/73  Pulse: 64  Height: 5\' 10"  (1.778 m)  Weight: 152 lb (68.947 kg)  SpO2: 96%    Right-sided dialysis catheter clean no erythema  Extremities: 2+ brachial pulses absent radial pulses bilaterally multiple  occluded grafts both arms  Assessment: Debilitated 79 year old male with multiple medical problems overall very deconditioned. He is not a candidate for a thigh graft due to severe PAD in his right lower extremity; it would put him at risk for amputation. He would be high risk for placement of a hemodialysis access from an operative standpoint due to his overall debility. He has not had a graft that lasted for a lengthy period of time in any prior occasion. He could possibly be a candidate for placement of a HERO graft. However, these are also usually of limited durability and did not believe the risk of operation is worth the possible short-term benefit of a HERO graft.  Plan: Continue using hemodialysis catheter for dialysis. The patient will followup on as-needed basis.  Fabienne Bruns, MD [facility] Office: [phone] Pager: [phone]

## 2013-03-27 ENCOUNTER — Emergency Department (HOSPITAL_COMMUNITY): Payer: Medicare Other

## 2013-03-27 ENCOUNTER — Encounter (HOSPITAL_COMMUNITY): Payer: Self-pay | Admitting: Emergency Medicine

## 2013-03-27 ENCOUNTER — Inpatient Hospital Stay (HOSPITAL_COMMUNITY)
Admission: EM | Admit: 2013-03-27 | Discharge: 2013-04-03 | DRG: 202 | Disposition: A | Discharge status: Skilled Nursing Facility | Payer: Medicare Other | Attending: Internal Medicine | Admitting: Internal Medicine

## 2013-03-27 DIAGNOSIS — R197 Diarrhea, unspecified: Secondary | ICD-10-CM | POA: Diagnosis present

## 2013-03-27 DIAGNOSIS — Z8673 Personal history of transient ischemic attack (TIA), and cerebral infarction without residual deficits: Secondary | ICD-10-CM

## 2013-03-27 DIAGNOSIS — I672 Cerebral atherosclerosis: Secondary | ICD-10-CM | POA: Diagnosis present

## 2013-03-27 DIAGNOSIS — J189 Pneumonia, unspecified organism: Secondary | ICD-10-CM

## 2013-03-27 DIAGNOSIS — S78119A Complete traumatic amputation at level between unspecified hip and knee, initial encounter: Secondary | ICD-10-CM

## 2013-03-27 DIAGNOSIS — I120 Hypertensive chronic kidney disease with stage 5 chronic kidney disease or end stage renal disease: Secondary | ICD-10-CM | POA: Diagnosis present

## 2013-03-27 DIAGNOSIS — N039 Chronic nephritic syndrome with unspecified morphologic changes: Secondary | ICD-10-CM

## 2013-03-27 DIAGNOSIS — R001 Bradycardia, unspecified: Secondary | ICD-10-CM | POA: Diagnosis present

## 2013-03-27 DIAGNOSIS — F0150 Vascular dementia without behavioral disturbance: Secondary | ICD-10-CM | POA: Diagnosis present

## 2013-03-27 DIAGNOSIS — R059 Cough, unspecified: Secondary | ICD-10-CM | POA: Diagnosis present

## 2013-03-27 DIAGNOSIS — K5900 Constipation, unspecified: Secondary | ICD-10-CM | POA: Diagnosis not present

## 2013-03-27 DIAGNOSIS — J9600 Acute respiratory failure, unspecified whether with hypoxia or hypercapnia: Secondary | ICD-10-CM | POA: Diagnosis present

## 2013-03-27 DIAGNOSIS — I129 Hypertensive chronic kidney disease with stage 1 through stage 4 chronic kidney disease, or unspecified chronic kidney disease: Secondary | ICD-10-CM

## 2013-03-27 DIAGNOSIS — Z66 Do not resuscitate: Secondary | ICD-10-CM | POA: Diagnosis present

## 2013-03-27 DIAGNOSIS — N2581 Secondary hyperparathyroidism of renal origin: Secondary | ICD-10-CM | POA: Diagnosis present

## 2013-03-27 DIAGNOSIS — R079 Chest pain, unspecified: Secondary | ICD-10-CM | POA: Diagnosis present

## 2013-03-27 DIAGNOSIS — I739 Peripheral vascular disease, unspecified: Secondary | ICD-10-CM | POA: Diagnosis present

## 2013-03-27 DIAGNOSIS — R630 Anorexia: Secondary | ICD-10-CM

## 2013-03-27 DIAGNOSIS — Z992 Dependence on renal dialysis: Secondary | ICD-10-CM

## 2013-03-27 DIAGNOSIS — N400 Enlarged prostate without lower urinary tract symptoms: Secondary | ICD-10-CM | POA: Diagnosis present

## 2013-03-27 DIAGNOSIS — D72829 Elevated white blood cell count, unspecified: Secondary | ICD-10-CM | POA: Diagnosis present

## 2013-03-27 DIAGNOSIS — N186 End stage renal disease: Secondary | ICD-10-CM | POA: Diagnosis present

## 2013-03-27 DIAGNOSIS — J069 Acute upper respiratory infection, unspecified: Secondary | ICD-10-CM | POA: Diagnosis present

## 2013-03-27 DIAGNOSIS — I4891 Unspecified atrial fibrillation: Secondary | ICD-10-CM | POA: Diagnosis present

## 2013-03-27 DIAGNOSIS — R05 Cough: Secondary | ICD-10-CM

## 2013-03-27 DIAGNOSIS — A419 Sepsis, unspecified organism: Secondary | ICD-10-CM

## 2013-03-27 DIAGNOSIS — J9601 Acute respiratory failure with hypoxia: Secondary | ICD-10-CM | POA: Diagnosis present

## 2013-03-27 DIAGNOSIS — E875 Hyperkalemia: Secondary | ICD-10-CM | POA: Diagnosis present

## 2013-03-27 DIAGNOSIS — R0902 Hypoxemia: Secondary | ICD-10-CM

## 2013-03-27 DIAGNOSIS — J209 Acute bronchitis, unspecified: Principal | ICD-10-CM | POA: Diagnosis present

## 2013-03-27 DIAGNOSIS — I10 Essential (primary) hypertension: Secondary | ICD-10-CM

## 2013-03-27 DIAGNOSIS — Z23 Encounter for immunization: Secondary | ICD-10-CM

## 2013-03-27 DIAGNOSIS — N189 Chronic kidney disease, unspecified: Secondary | ICD-10-CM

## 2013-03-27 DIAGNOSIS — G9341 Metabolic encephalopathy: Secondary | ICD-10-CM | POA: Diagnosis present

## 2013-03-27 DIAGNOSIS — M810 Age-related osteoporosis without current pathological fracture: Secondary | ICD-10-CM

## 2013-03-27 DIAGNOSIS — D631 Anemia in chronic kidney disease: Secondary | ICD-10-CM | POA: Diagnosis present

## 2013-03-27 DIAGNOSIS — I498 Other specified cardiac arrhythmias: Secondary | ICD-10-CM | POA: Diagnosis present

## 2013-03-27 HISTORY — DX: Unspecified dementia, unspecified severity, without behavioral disturbance, psychotic disturbance, mood disturbance, and anxiety: F03.90

## 2013-03-27 LAB — COMPREHENSIVE METABOLIC PANEL
ALBUMIN: 3.2 g/dL — AB (ref 3.5–5.2)
ALT: 21 U/L (ref 0–53)
AST: 23 U/L (ref 0–37)
Alkaline Phosphatase: 120 U/L — ABNORMAL HIGH (ref 39–117)
BILIRUBIN TOTAL: 0.2 mg/dL — AB (ref 0.3–1.2)
BUN: 52 mg/dL — AB (ref 6–23)
CHLORIDE: 95 meq/L — AB (ref 96–112)
CO2: 29 mEq/L (ref 19–32)
Calcium: 9 mg/dL (ref 8.4–10.5)
Creatinine, Ser: 5.27 mg/dL — ABNORMAL HIGH (ref 0.50–1.35)
GFR calc non Af Amer: 9 mL/min — ABNORMAL LOW (ref >90)
GFR, EST AFRICAN AMERICAN: 10 mL/min — AB (ref >90)
GLUCOSE: 145 mg/dL — AB (ref 70–99)
Potassium: 5.5 mEq/L — ABNORMAL HIGH (ref 3.7–5.3)
Sodium: 141 mEq/L (ref 137–147)
Total Protein: 6.8 g/dL (ref 6.0–8.3)

## 2013-03-27 LAB — POCT I-STAT TROPONIN I: TROPONIN I, POC: 0.04 ng/mL (ref 0.00–0.08)

## 2013-03-27 LAB — CBC WITH DIFFERENTIAL/PLATELET
BASOS ABS: 0 10*3/uL (ref 0.0–0.1)
Basophils Relative: 0 % (ref 0–1)
Eosinophils Absolute: 0.3 10*3/uL (ref 0.0–0.7)
Eosinophils Relative: 2 % (ref 0–5)
HCT: 34 % — ABNORMAL LOW (ref 39.0–52.0)
Hemoglobin: 11.1 g/dL — ABNORMAL LOW (ref 13.0–17.0)
LYMPHS ABS: 1.5 10*3/uL (ref 0.7–4.0)
Lymphocytes Relative: 9 % — ABNORMAL LOW (ref 12–46)
MCH: 32.8 pg (ref 26.0–34.0)
MCHC: 32.6 g/dL (ref 30.0–36.0)
MCV: 100.6 fL — ABNORMAL HIGH (ref 78.0–100.0)
Monocytes Absolute: 1.7 10*3/uL — ABNORMAL HIGH (ref 0.1–1.0)
Monocytes Relative: 10 % (ref 3–12)
NEUTROS ABS: 13.3 10*3/uL — AB (ref 1.7–7.7)
Neutrophils Relative %: 79 % — ABNORMAL HIGH (ref 43–77)
Platelets: 202 10*3/uL (ref 150–400)
RBC: 3.38 MIL/uL — AB (ref 4.22–5.81)
RDW: 14.5 % (ref 11.5–15.5)
WBC: 16.8 10*3/uL — AB (ref 4.0–10.5)

## 2013-03-27 LAB — INFLUENZA PANEL BY PCR (TYPE A & B)
H1N1 flu by pcr: NOT DETECTED
INFLAPCR: NEGATIVE
Influenza B By PCR: NEGATIVE

## 2013-03-27 LAB — TROPONIN I: Troponin I: <0.3 ng/mL (ref <0.30)

## 2013-03-27 LAB — LIPASE, BLOOD: Lipase: 79 U/L — ABNORMAL HIGH (ref 11–59)

## 2013-03-27 LAB — GLUCOSE, CAPILLARY: GLUCOSE-CAPILLARY: 123 mg/dL — AB (ref 70–99)

## 2013-03-27 LAB — MRSA PCR SCREENING: MRSA BY PCR: NEGATIVE

## 2013-03-27 MED ORDER — ACETAMINOPHEN 325 MG PO TABS
650.0000 mg | ORAL_TABLET | Freq: Four times a day (QID) | ORAL | Status: DC | PRN
  Administered 2013-03-30 – 2013-03-31 (×2): 650 mg via ORAL
  Filled 2013-03-27 (×2): qty 2

## 2013-03-27 MED ORDER — LEVOFLOXACIN IN D5W 500 MG/100ML IV SOLN
500.0000 mg | INTRAVENOUS | Status: DC
  Filled 2013-03-27: qty 100

## 2013-03-27 MED ORDER — ONDANSETRON HCL 4 MG/2ML IJ SOLN: 4.0000 mg | Freq: Four times a day (QID) | INTRAMUSCULAR | Status: DC | PRN

## 2013-03-27 MED ORDER — OSELTAMIVIR PHOSPHATE 30 MG PO CAPS
30.0000 mg | ORAL_CAPSULE | ORAL | Status: DC
  Administered 2013-03-27: 30 mg via ORAL
  Filled 2013-03-27 (×2): qty 1

## 2013-03-27 MED ORDER — CARVEDILOL 3.125 MG PO TABS
3.1250 mg | ORAL_TABLET | Freq: Two times a day (BID) | ORAL | Status: DC
  Administered 2013-03-28: 3.125 mg via ORAL
  Filled 2013-03-27 (×3): qty 1

## 2013-03-27 MED ORDER — VANCOMYCIN HCL IN DEXTROSE 750-5 MG/150ML-% IV SOLN
750.0000 mg | INTRAVENOUS | Status: DC
  Filled 2013-03-27: qty 150

## 2013-03-27 MED ORDER — ALBUTEROL SULFATE (2.5 MG/3ML) 0.083% IN NEBU
2.5000 mg | INHALATION_SOLUTION | Freq: Four times a day (QID) | RESPIRATORY_TRACT | Status: DC
  Administered 2013-03-28 (×2): 2.5 mg via RESPIRATORY_TRACT
  Filled 2013-03-27 (×2): qty 3

## 2013-03-27 MED ORDER — ONDANSETRON HCL 4 MG PO TABS: 4.0000 mg | ORAL_TABLET | Freq: Four times a day (QID) | ORAL | Status: DC | PRN

## 2013-03-27 MED ORDER — ASPIRIN 81 MG PO CHEW
162.0000 mg | CHEWABLE_TABLET | Freq: Once | ORAL | Status: AC
  Administered 2013-03-27: 162 mg via ORAL
  Filled 2013-03-27: qty 2

## 2013-03-27 MED ORDER — SIMVASTATIN 10 MG PO TABS
10.0000 mg | ORAL_TABLET | Freq: Every day | ORAL | Status: DC
  Administered 2013-03-27 – 2013-04-02 (×5): 10 mg via ORAL
  Filled 2013-03-27 (×8): qty 1

## 2013-03-27 MED ORDER — ISOSORBIDE MONONITRATE ER 60 MG PO TB24
60.0000 mg | ORAL_TABLET | Freq: Every day | ORAL | Status: DC
  Administered 2013-03-28 – 2013-04-03 (×7): 60 mg via ORAL
  Filled 2013-03-27 (×7): qty 1

## 2013-03-27 MED ORDER — DEXTROSE 5 % IV SOLN: 1.0000 g | INTRAVENOUS | Status: DC

## 2013-03-27 MED ORDER — RENA-VITE PO TABS: 1.0000 | ORAL_TABLET | Freq: Every day | ORAL | Status: DC

## 2013-03-27 MED ORDER — CALCIUM ACETATE 667 MG PO CAPS
1334.0000 mg | ORAL_CAPSULE | Freq: Three times a day (TID) | ORAL | Status: DC
Start: 2013-03-28 — End: 2013-04-03
  Administered 2013-03-28 – 2013-04-03 (×16): 1334 mg via ORAL
  Filled 2013-03-27 (×22): qty 2

## 2013-03-27 MED ORDER — ENOXAPARIN SODIUM 30 MG/0.3ML ~~LOC~~ SOLN
30.0000 mg | SUBCUTANEOUS | Status: DC
  Administered 2013-03-27: 30 mg via SUBCUTANEOUS
  Filled 2013-03-27 (×3): qty 0.3

## 2013-03-27 MED ORDER — GUAIFENESIN-DM 100-10 MG/5ML PO SYRP
5.0000 mL | ORAL_SOLUTION | ORAL | Status: DC | PRN
  Filled 2013-03-27: qty 5

## 2013-03-27 MED ORDER — ASPIRIN EC 81 MG PO TBEC
81.0000 mg | DELAYED_RELEASE_TABLET | Freq: Every day | ORAL | Status: DC
  Administered 2013-03-29 – 2013-04-02 (×4): 81 mg via ORAL
  Filled 2013-03-27 (×7): qty 1

## 2013-03-27 MED ORDER — VANCOMYCIN HCL 10 G IV SOLR
1500.0000 mg | Freq: Once | INTRAVENOUS | Status: DC
  Filled 2013-03-27: qty 1500

## 2013-03-27 MED ORDER — SODIUM CHLORIDE 0.45 % IV SOLN
INTRAVENOUS | Status: DC
  Administered 2013-03-27: 21:00:00 via INTRAVENOUS

## 2013-03-27 MED ORDER — CARISOPRODOL 350 MG PO TABS: 350.0000 mg | ORAL_TABLET | Freq: Two times a day (BID) | ORAL | Status: DC | PRN

## 2013-03-27 MED ORDER — BENZONATATE 100 MG PO CAPS
100.0000 mg | ORAL_CAPSULE | Freq: Three times a day (TID) | ORAL | Status: DC
  Administered 2013-03-27 – 2013-04-03 (×17): 100 mg via ORAL
  Filled 2013-03-27 (×22): qty 1

## 2013-03-27 MED ORDER — ONDANSETRON HCL 4 MG/2ML IJ SOLN
4.0000 mg | Freq: Once | INTRAMUSCULAR | Status: AC
  Administered 2013-03-27: 4 mg via INTRAVENOUS
  Filled 2013-03-27: qty 2

## 2013-03-27 MED ORDER — VITAMIN B-1 100 MG PO TABS
100.0000 mg | ORAL_TABLET | Freq: Every day | ORAL | Status: DC
  Filled 2013-03-27: qty 1

## 2013-03-27 MED ORDER — CINACALCET HCL 30 MG PO TABS
30.0000 mg | ORAL_TABLET | Freq: Every evening | ORAL | Status: DC
  Administered 2013-03-27 – 2013-04-02 (×6): 30 mg via ORAL
  Filled 2013-03-27 (×8): qty 1

## 2013-03-27 MED ORDER — ACETAMINOPHEN 650 MG RE SUPP: 650.0000 mg | Freq: Four times a day (QID) | RECTAL | Status: DC | PRN

## 2013-03-27 MED ORDER — DEXTROSE 5 % IV SOLN
2.0000 g | Freq: Once | INTRAVENOUS | Status: AC
  Administered 2013-03-27: 2 g via INTRAVENOUS

## 2013-03-27 MED ORDER — SODIUM POLYSTYRENE SULFONATE 15 GM/60ML PO SUSP
15.0000 g | Freq: Once | ORAL | Status: AC
  Administered 2013-03-27: 15 g via ORAL
  Filled 2013-03-27: qty 60

## 2013-03-27 MED ORDER — DONEPEZIL HCL 5 MG PO TABS
5.0000 mg | ORAL_TABLET | Freq: Every day | ORAL | Status: DC
  Administered 2013-03-27: 5 mg via ORAL
  Filled 2013-03-27 (×2): qty 1

## 2013-03-27 MED ORDER — METRONIDAZOLE IN NACL 5-0.79 MG/ML-% IV SOLN
500.0000 mg | Freq: Three times a day (TID) | INTRAVENOUS | Status: DC
  Administered 2013-03-27 – 2013-03-28 (×2): 500 mg via INTRAVENOUS
  Filled 2013-03-27 (×4): qty 100

## 2013-03-27 NOTE — ED Notes (Signed)
Called lab, they can add on lipase.

## 2013-03-27 NOTE — ED Notes (Signed)
Pt desats on room air while sleeping in mid 80's. Applied 2L Portal pt's sats 97% on room air while pt resting.

## 2013-03-27 NOTE — H&P (Signed)
[facility] History and Physical  HAIZE OSSMAN [id number] DOB: 22-Apr-1919 DOA: 03/27/2013  Referring physician: ED physician, Dr. Doy Mince PCP: Lucrezia Starch, MD   Chief Complaint: Chest pain and cough.  HPI: Logan [name] is a 80 y.o. male with a history of end-stage renal disease, dementia, and peripheral vascular disease, who presents to the hospital today with a complaint of chest pain and cough. The patient is lethargic and is unable to provide a reliable history. He actually denies chest pain. He only complains of some soreness in his right leg. The history is being provided by his wife and his daughter. Accordingly, over the past 2 weeks, the patient has had a cough which has been nonproductive and productive. She has been coughing more over the past 2 days. His sputum is clear or white. He has complained of central chest pain during the cough and without the cough. There's been no associated diaphoresis or radiation of the pain. He has had some shortness of breath. His wife does not believe he has had pleurisy. He may have had chills, but no fever. He has had a poor appetite. He has also had diarrhea numbering one to 2 watery stools daily for the past 4 or 5 days which concerned his daughter. He has a history of C. difficile in the past. Of note, he was started on treatment with clindamycin for a mildly infected second toe by his podiatrist on March 12, 2013. He was given his last dose on March 24, 2013 because his daughter was concerned that the clindamycin was causing the diarrhea.   In the emergency department, he was mildly febrile with temperature 100.8. He was mildly bradycardic with a heart rate of 55, but ranging from 55-74. He was hypertensive with blood pressure in the [id number] to [id number] systolically. His lab data were significant for WBC of 16.8, hemoglobin 11.1, potassium of 5.5, BUN of 52, and creatinine is 5.27. His chest x-ray revealed stable cardiomegaly and  chronic lung disease. His EKG reveals atrial fibrillation with a heart rate of 64 beats per minute. He is being admitted for further evaluation and management.   Review of Systems:  Constitutional:  No weight loss, night sweats, Fevers  HEENT:  No headaches, Difficulty swallowing,Tooth/dental problems,Sore throat,   Cardio-vascular:  positive for chest pain and shortness of breath GI:  Positive for diarrhea, but no heartburn, indigestion, abdominal pain, nausea, vomiting, diarrhea, change in bowel habits, loss of appetite  Resp:  Positive for productive cough with clear sputum/mucus.No shortness of breath with exertion or at rest.  Skin:  Has a small ulcer on his right second toe  GU:  no dysuria, change in color of urine, no urgency or frequency. No flank pain.  Musculoskeletal:  Some joint discomfort in his right knee. No decreased range of motion. No back pain.  Psych:  More sleepy lately.  Past Medical History  Diagnosis Date  . Hypertension   . ESRD on dialysis     MONDAY, [place], and FRIDAY  . Stroke 2005  . Anemia   . Secondary hyperparathyroidism   . Peripheral vascular disease, unspecified   . Basal cell carcinoma   . BPH (benign prostatic hypertrophy)   . History of Clostridium difficile infection   . Cataract   . Abnormal nuclear stress test 2005    Mild distal anterior ischemia on nuclear test  . Shortness of breath   . Dementia   . C. difficile colitis    Past Surgical History  Procedure Laterality Date  . Insertion of dialysis catheter  09/27/2010    Left IJ Diatek Catheter  . Above knee leg amputation  12/26/2004    Left AKA by Dr. Oneida Alar  . Tonsillectomy and adenoidectomy    . Thrombectomy / arteriovenous graft revision      Numerous revision and declots  . Femoral bypass  2005    Left Femoral to posterior tibial BPG w/ composite PTFE and vein  . Hernia repair    . Appendectomy    . Cataract extraction      Left eye   . Arteriovenous graft  placement     Social History: the patient is married. He and his wife live in the independent living section of [place]. He is wheelchair-bound. He has no history of tobacco, alcohol, or illicit drug use.   No Known Allergies  Family History  Problem Relation Age of Onset  . Heart disease Maternal Uncle   . Heart attack Father     71's   . Heart attack Sister     51's      Prior to Admission medications   Medication Sig Start Date End Date Taking? Authorizing Provider  albuterol (PROVENTIL) (5 MG/ML) 0.5% nebulizer solution Take 2.5 mg by nebulization every 6 (six) hours as needed for wheezing.   Yes Historical Provider, MD  aspirin EC 81 MG tablet Take 81 mg by mouth at bedtime.   Yes Historical Provider, MD  calcium acetate (PHOSLO) 667 MG capsule Take 1,334 mg by mouth 3 (three) times daily with meals.   Yes Historical Provider, MD  carisoprodol (SOMA) 350 MG tablet Take 350 mg by mouth 4 (four) times daily as needed. For cramps   Yes Historical Provider, MD  carvedilol (COREG) 3.125 MG tablet Take 3.125 mg by mouth 2 (two) times daily with a meal.  02/05/13  Yes Mahima Pandey, MD  cinacalcet (SENSIPAR) 30 MG tablet Take 30 mg by mouth every evening.   Yes Historical Provider, MD  donepezil (ARICEPT) 5 MG tablet Take 1 tablet (5 mg total) by mouth at bedtime. 01/30/13  Yes Mahima Bubba Camp, MD  isosorbide mononitrate (IMDUR) 60 MG 24 hr tablet Take 1 tablet (60 mg total) by mouth daily. 05/17/11 03/22/97 Yes Robbie Lis, MD  multivitamin (RENA-VIT) TABS tablet Take 1 tablet by mouth at bedtime.   Yes Historical Provider, MD  simvastatin (ZOCOR) 10 MG tablet Take 10 mg by mouth at bedtime.   Yes Historical Provider, MD  thiamine (VITAMIN B-1) 100 MG tablet Take 100 mg by mouth at bedtime.   Yes Historical Provider, MD   Physical Exam: Filed Vitals:   03/27/13 1852  BP:   Pulse:   Temp: 99.5 F (37.5 C)  Resp:     BP 167/59  Pulse 60  Temp(Src) 99.5 F (37.5 C) (Oral)   Resp 26  Ht 5' 10.08" (1.78 m)  Wt 68.9 kg (151 lb 14.4 oz)  BMI 21.75 kg/m2  SpO2 97%  General:  Elderly 80 year old Caucasian man laying in bed, asleep, in no acute distress.  Eyes: PERRL, normal lids, irises & conjunctiva ENT: grossly normal hearing, lips & tongue Neck: no LAD, masses or thyromegaly Cardiovascular: S1, S2, with no ectopy per my auscultation. No LE edema. Dialysis catheter noted right chest wall. Telemetry: sinus rhythm with occasional ectopy versus atrial fibrillation.   Respiratory: occasional/scattered upper airway crackles, partially cleared with coughing. Normal respiratory effort. Abdomen: soft, mildly obese, minimal tenderness in the hypogastrium without  rebound or guarding or appreciable distention.  Skin: there are a few healing excoriations on his right leg. Healing ulceration on the right second toe, with minimal erythema and no drainage or fluctuance.  Musculoskeletal: left AKA stump without abnormalities. No acute hot red joints.  Psychiatric: he is lethargic. He is sleeping, but answers questions appropriately then he falls back to sleep. He is oriented to himself and hospital.  Neurologic: cranial nerves II through XII are grossly intact, although he is intermittently lethargic. He moves all of his extremities with and without provocation.           Labs on Admission:  Basic Metabolic Panel:  Recent Labs Lab 03/27/13 1450  NA 141  K 5.5*  CL 95*  CO2 29  GLUCOSE 145*  BUN 52*  CREATININE 5.27*  CALCIUM 9.0   Liver Function Tests:  Recent Labs Lab 03/27/13 1450  AST 23  ALT 21  ALKPHOS 120*  BILITOT 0.2*  PROT 6.8  ALBUMIN 3.2*    Recent Labs Lab 03/27/13 1450  LIPASE 79*   No results found for this basename: AMMONIA,  in the last 168 hours CBC:  Recent Labs Lab 03/27/13 1450  WBC 16.8*  NEUTROABS 13.3*  HGB 11.1*  HCT 34.0*  MCV 100.6*  PLT 202   Cardiac Enzymes: No results found for this basename: CKTOTAL, CKMB,  CKMBINDEX, TROPONINI,  in the last 168 hours  BNP (last 3 results) No results found for this basename: PROBNP,  in the last 8760 hours CBG:  Recent Labs Lab 03/27/13 1856  GLUCAP 123*    Radiological Exams on Admission: Dg Chest Portable 1 View  03/27/2013   CLINICAL DATA:  Chest pain and shortness of breath.  EXAM: PORTABLE CHEST - 1 VIEW  COMPARISON:  04/20/2012  FINDINGS: Stable appearance of dialysis catheter and chronic lung disease. Stable chronic pleural thickening in the right hemithorax. No overt edema or airspace consolidation is seen. There is stable cardiomegaly. No significant pleural fluid.  IMPRESSION: Stable cardiomegaly and chronic lung disease.   Electronically Signed   By: Aletta Edouard M.D.   On: 03/27/2013 15:16    EKG: Independently reviewed. as above in history of present illness.  Assessment/Plan Principal Problem:   Chest pain Active Problems:   Acute respiratory failure with hypoxia   Cough   Atrial fibrillation   Hyperkalemia   Diarrhea   ESRD on dialysis   Vascular dementia   Bradycardia   Hypertension   Upper respiratory infection   Anemia in chronic kidney disease   Encephalopathy, metabolic   1. This is a 80 year old man with multiple medical conditions who presents with chest pain and cough. I am not convinced that he has healthcare associated pneumonia , but rather upper respiratory infection or possibly an acute bronchitis. His hypoxia occurs mostly when he is drifting off to sleep, so obstructive sleep apnea may be a possibility. His chest pain may be related to his coughing, but myocardial infarction or ischemia will need to be assessed. I'm concerned about the possibility of infection with influenza A. given the current environment. I am also concerned about C. difficile colitis given his recent treatment with clindamycin and associated diarrhea. He has end-stage renal disease and it is likely that his hyperkalemia is secondary to it. He was  given Kayexalate in the emergency department before it was realized that he had a recent history of diarrhea. He has no known prior history of atrial fibrillation per family, but his initial  EKG revealed atrial fibrillation. On my exam, I did not hear ectopy.    Plan: 1. The patient was given vancomycin and cefepime in the emergency department. We'll continue vancomycin but will discontinue cefepime and start Levaquin for acute bronchitis instead. Would favor discontinuing all antibiotics if his influenza panel and/or C. difficile PCR comes back positive. 2. We'll start Flagyl and Tamiflu empirically. As above, we'll order the influenza panel and C. difficile PCR for further evaluation. 3. Will restart most of his chronic medications tomorrow, hoping he will be less encephalopathic. Will ask the nursing staff to hold his medications and liquids appropriately. 4. Albuterol nebulizations every 6 hours. 5. Continue aspirin therapy, statin, beta blocker, and mononitrate therapy.  6. Tessalon Perles for his cough. 7. For further evaluation, we'll order troponin I x3, 2-D echocardiogram. We'll order another EKG in the morning. 8. Would favor discontinuing antibiotics if his influenza panel and/or C. difficile PCR is/are positive. 9. The patient is due for hemodialysis tomorrow (Monday Wednesday Friday schedule) nephrology will need to be consulted.    Code Status: DO NOT RESUSCITATE per conversation with the family Family Communication: discussed with the patient's wife and daughter. Disposition Plan: to be determined.  Time spent: 1 hour and 30 minutes.  [facility] Pager [phone]

## 2013-03-27 NOTE — Progress Notes (Addendum)
ANTIBIOTIC CONSULT NOTE - INITIAL  Pharmacy Consult:  Vancomycin / Cefepime Indication: pneumonia  No Known Allergies  Patient Measurements: Height: 5' 10.08" (178 cm) Weight: 151 lb 14.4 oz (68.9 kg) IBW/kg (Calculated) : 73.18  Vital Signs: Temp: 100.8 F (38.2 C) (01/06 1709) Temp src: Rectal (01/06 1709) BP: 150/55 mmHg (01/06 1545) Pulse Rate: 55 (01/06 1545)  Labs:  Recent Labs  03/27/13 1450  WBC 16.8*  HGB 11.1*  PLT 202  CREATININE 5.27*   Estimated Creatinine Clearance: 8.9 ml/min (by C-G formula based on Cr of 5.27). No results found for this basename: VANCOTROUGH, VANCOPEAK, VANCORANDOM, GENTTROUGH, GENTPEAK, GENTRANDOM, TOBRATROUGH, TOBRAPEAK, TOBRARND, AMIKACINPEAK, AMIKACINTROU, AMIKACIN,  in the last 72 hours   Microbiology: No results found for this or any previous visit (from the past 720 hour(s)).  Medical History: Past Medical History  Diagnosis Date  . Hypertension   . ESRD on dialysis     MONDAY, [place], and FRIDAY  . Stroke 2005  . Anemia   . Secondary hyperparathyroidism   . Peripheral vascular disease, unspecified   . Basal cell carcinoma   . BPH (benign prostatic hypertrophy)   . History of Clostridium difficile infection   . Cataract   . Abnormal nuclear stress test 2005    Mild distal anterior ischemia on nuclear test  . Shortness of breath       Assessment: 89 YOM from assisted living facility presented with SOB and CP.  Pharmacy consulted to manage vancomycin and cefepime for PNA.  Aware patient has ESRD on HD MWF.   Goal of Therapy:  Vanc pre-HD level:  15-25 mcg/mL   Plan:  - Vanc 1500mg  IV x 1, then 750mg  IV q-HD MWF - Cefepime 2gm IV x 1, then 2gm IV q-HD MWF - Monitor HD tolerance, vanc pre-HD level as indicated, abx LOT    Clements Toro D. Mina Marble, PharmD, BCPS Pager:  [phone] 03/27/2013, 5:27 PM

## 2013-03-27 NOTE — ED Notes (Signed)
Pt attempted to eat lunch today at 1230 at assisted living but started feeling SOB and having CP 6/10. Pt was given 325 of ASA and 2 Nitro bringing pain down to 5/10. Pt denies N/V, fever, chills. Pt sinus rhythm HR70's, BP 188/90, 200/86.

## 2013-03-27 NOTE — ED Provider Notes (Signed)
CSN: [id number]     Arrival date & time 03/27/13  1354 History   First MD Initiated Contact with Patient 03/27/13 1420     Chief Complaint  Patient presents with  . Chest Pain  . Shortness of Breath   (Consider location/radiation/quality/duration/timing/severity/associated sxs/prior Treatment) HPI Comments: Patient presents to the ED with a chief complaint of chest pain. He states that the pain started today after lunch.  He reports associated SOB, nausea, and vomiting.  He states that the pain does radiate, but can't clarify to where.  He states that the pain is about a 2/10.  He has ESRD, and is on HD.  He is dialyzed MWF.  He also complains of epigastric abdominal pain. He has some mild dementia. He lives in an assisted living with his wife.  The history is provided by the patient. No language interpreter was used.    Past Medical History  Diagnosis Date  . Hypertension   . ESRD on dialysis     MONDAY, [place], and FRIDAY  . Stroke 2005  . Anemia   . Secondary hyperparathyroidism   . Peripheral vascular disease, unspecified   . Basal cell carcinoma   . BPH (benign prostatic hypertrophy)   . History of Clostridium difficile infection   . Cataract   . Abnormal nuclear stress test 2005    Mild distal anterior ischemia on nuclear test  . Shortness of breath    Past Surgical History  Procedure Laterality Date  . Insertion of dialysis catheter  09/27/2010    Left IJ Diatek Catheter  . Above knee leg amputation  12/26/2004    Left AKA by Dr. Oneida Alar  . Tonsillectomy and adenoidectomy    . Thrombectomy / arteriovenous graft revision      Numerous revision and declots  . Femoral bypass  2005    Left Femoral to posterior tibial BPG w/ composite PTFE and vein  . Hernia repair    . Appendectomy    . Cataract extraction      Left eye   . Arteriovenous graft placement     Family History  Problem Relation Age of Onset  . Heart disease Maternal Uncle   . Heart attack Father      68's   . Heart attack Sister     42's    History  Substance Use Topics  . Smoking status: Never Smoker   . Smokeless tobacco: Never Used  . Alcohol Use: No    Review of Systems  All other systems reviewed and are negative.    Allergies  Review of patient's allergies indicates no known allergies.  Home Medications   Current Outpatient Rx  Name  Route  Sig  Dispense  Refill  . albuterol (PROVENTIL) (5 MG/ML) 0.5% nebulizer solution   Nebulization   Take 2.5 mg by nebulization every 6 (six) hours as needed for wheezing.         Marland Kitchen aspirin EC 81 MG tablet   Oral   Take 81 mg by mouth at bedtime.         . calcium acetate, Phos Binder, (PHOSLYRA) 667 MG/5ML SOLN   Oral   Take 667 mg by mouth 3 (three) times daily with meals.         . carisoprodol (SOMA) 350 MG tablet   Oral   Take 350 mg by mouth 4 (four) times daily as needed. For cramps         . carvedilol (COREG) 3.125 MG tablet  Oral   Take 3.125 mg by mouth 2 (two) times daily with a meal.          . cinacalcet (SENSIPAR) 30 MG tablet   Oral   Take 30 mg by mouth every evening.         . donepezil (ARICEPT) 5 MG tablet   Oral   Take 1 tablet (5 mg total) by mouth at bedtime.   30 tablet   3   . isosorbide mononitrate (IMDUR) 60 MG 24 hr tablet   Oral   Take 1 tablet (60 mg total) by mouth daily.   30 tablet   0   . multivitamin (RENA-VIT) TABS tablet   Oral   Take 1 tablet by mouth at bedtime.         . simvastatin (ZOCOR) 10 MG tablet   Oral   Take 10 mg by mouth at bedtime.         . thiamine (VITAMIN B-1) 100 MG tablet   Oral   Take 100 mg by mouth at bedtime.          BP 217/67  Pulse 74  Temp(Src) 98.7 F (37.1 C) (Oral)  Resp 29  SpO2 94% Physical Exam  Nursing note and vitals reviewed. Constitutional: He is oriented to person, place, and time. He appears well-developed and well-nourished.  Patient is groaning  HENT:  Head: Normocephalic and atraumatic.   Eyes: Conjunctivae and EOM are normal. Pupils are equal, round, and reactive to light. Right eye exhibits no discharge. Left eye exhibits no discharge. No scleral icterus.  Neck: Normal range of motion. Neck supple. No JVD present.  Cardiovascular: Normal rate, regular rhythm, normal heart sounds and intact distal pulses.  Exam reveals no gallop and no friction rub.   No murmur heard. Pulmonary/Chest: Effort normal and breath sounds normal. No respiratory distress. He has no wheezes. He has no rales. He exhibits no tenderness.  Catheter in right upper chest wall, no evidence of infection  Abdominal: Soft. Bowel sounds are normal. He exhibits no distension and no mass. There is no tenderness. There is no rebound and no guarding.  Musculoskeletal: Normal range of motion. He exhibits no edema and no tenderness.  Left knee AKA  Neurological: He is alert and oriented to person, place, and time.  Skin: Skin is warm and dry.  Psychiatric: He has a normal mood and affect. His behavior is normal. Judgment and thought content normal.    ED Course  Procedures (including critical care time) Results for orders placed during the hospital encounter of 03/27/13  COMPREHENSIVE METABOLIC PANEL      Result Value Range   Sodium 141  137 - 147 mEq/L   Potassium 5.5 (*) 3.7 - 5.3 mEq/L   Chloride 95 (*) 96 - 112 mEq/L   CO2 29  19 - 32 mEq/L   Glucose, Bld 145 (*) 70 - 99 mg/dL   BUN 52 (*) 6 - 23 mg/dL   Creatinine, Ser 5.27 (*) 0.50 - 1.35 mg/dL   Calcium 9.0  8.4 - 10.5 mg/dL   Total Protein 6.8  6.0 - 8.3 g/dL   Albumin 3.2 (*) 3.5 - 5.2 g/dL   AST 23  0 - 37 U/L   ALT 21  0 - 53 U/L   Alkaline Phosphatase 120 (*) 39 - 117 U/L   Total Bilirubin 0.2 (*) 0.3 - 1.2 mg/dL   GFR calc non Af Amer 9 (*) >90 mL/min   GFR calc  Af Amer 10 (*) >90 mL/min  CBC WITH DIFFERENTIAL      Result Value Range   WBC 16.8 (*) 4.0 - 10.5 K/uL   RBC 3.38 (*) 4.22 - 5.81 MIL/uL   Hemoglobin 11.1 (*) 13.0 - 17.0 g/dL    HCT 34.0 (*) 39.0 - 52.0 %   MCV 100.6 (*) 78.0 - 100.0 fL   MCH 32.8  26.0 - 34.0 pg   MCHC 32.6  30.0 - 36.0 g/dL   RDW 14.5  11.5 - 15.5 %   Platelets 202  150 - 400 K/uL   Neutrophils Relative % 79 (*) 43 - 77 %   Neutro Abs 13.3 (*) 1.7 - 7.7 K/uL   Lymphocytes Relative 9 (*) 12 - 46 %   Lymphs Abs 1.5  0.7 - 4.0 K/uL   Monocytes Relative 10  3 - 12 %   Monocytes Absolute 1.7 (*) 0.1 - 1.0 K/uL   Eosinophils Relative 2  0 - 5 %   Eosinophils Absolute 0.3  0.0 - 0.7 K/uL   Basophils Relative 0  0 - 1 %   Basophils Absolute 0.0  0.0 - 0.1 K/uL  POCT I-STAT TROPONIN I      Result Value Range   Troponin i, poc 0.04  0.00 - 0.08 ng/mL   Comment 3            Dg Chest Portable 1 View  03/27/2013   CLINICAL DATA:  Chest pain and shortness of breath.  EXAM: PORTABLE CHEST - 1 VIEW  COMPARISON:  04/20/2012  FINDINGS: Stable appearance of dialysis catheter and chronic lung disease. Stable chronic pleural thickening in the right hemithorax. No overt edema or airspace consolidation is seen. There is stable cardiomegaly. No significant pleural fluid.  IMPRESSION: Stable cardiomegaly and chronic lung disease.   Electronically Signed   By: Aletta Edouard M.D.   On: 03/27/2013 15:16     EKG Interpretation    Date/Time:  Tuesday March 27 2013 14:01:16 EST Ventricular Rate:  64 PR Interval:    QRS Duration: 76 QT Interval:  419 QTC Calculation: 432 R Axis:   32 Text Interpretation:  Age not entered, assumed to be  80 years old for purpose of ECG interpretation Atrial fibrillation Nonspecific repol abnormality, lateral leads compared to prior, a fib has replaced sinus rhythm Confirmed by [facility]  MD, TREY ([id number]) on 03/27/2013 2:42:54 PM            MDM   1. Chest pain   2. ESRD (end stage renal disease)    Patient with chest pain that started after lunch today.  Received aspirin and nitro with some relief.  Patient discussed with Dr. Doy Mince, who recommends admission for chest  pain.  Labs are remarkable for elevated WBC, K is 5.5, and ekg shows possible new afib.  Negative intial troponin.  I discussed the patient with the Teaching Service, who will admit the patient as an unassigned patient.    Montine Circle, PA-C 03/27/13 1559

## 2013-03-27 NOTE — ED Provider Notes (Signed)
Medical screening examination/treatment/procedure(s) were conducted as a shared visit with non-physician practitioner(s) and myself.  I personally evaluated the patient during the encounter.   Please see my separate note.     Houston Siren, MD 03/27/13 [id number]

## 2013-03-27 NOTE — ED Notes (Signed)
Teaching service at bedside.  

## 2013-03-27 NOTE — ED Notes (Signed)
Pt's wife states he has had a cough x 3 weeks and coughing up thin clear sputum.

## 2013-03-27 NOTE — ED Provider Notes (Signed)
Medical screening examination/treatment/procedure(s) were conducted as a shared visit with non-physician practitioner(s) and myself.  I personally evaluated the patient during the encounter.  EKG Interpretation    Date/Time:  Tuesday March 27 2013 14:01:16 EST Ventricular Rate:  64 PR Interval:    QRS Duration: 76 QT Interval:  419 QTC Calculation: 432 R Axis:   32 Text Interpretation:  Age not entered, assumed to be  80 years old for purpose of ECG interpretation Atrial fibrillation Nonspecific repol abnormality, lateral leads compared to prior, a fib has replaced sinus rhythm Confirmed by [facility]  MD, TREY ([id number]) on 03/27/2013 2:42:54 PM            79 yo male with hx of ESRD presenting with SOB and CP.  On my exam, seems somewhat sleepy.  Feels warm (has not been febrile up to this point per family).  Lungs show bibasilar rales.  Heart sounds irregularly irregular.    Rectal temp now 100.8.  Will treat for presumed HCAP.  Have called Hospitalists for admission (initially mistriaged to Internal Medicine Teaching Service).  Will be admitted to stepdown unit.    Clinical Impression: 1. Chest pain   2. ESRD (end stage renal disease)   3. Sepsis       Houston Siren, MD 03/27/13 1759

## 2013-03-28 DIAGNOSIS — I369 Nonrheumatic tricuspid valve disorder, unspecified: Secondary | ICD-10-CM

## 2013-03-28 LAB — RENAL FUNCTION PANEL
Albumin: 2.8 g/dL — ABNORMAL LOW (ref 3.5–5.2)
BUN: 66 mg/dL — ABNORMAL HIGH (ref 6–23)
CO2: 26 mEq/L (ref 19–32)
Calcium: 8.6 mg/dL (ref 8.4–10.5)
Chloride: 97 mEq/L (ref 96–112)
Creatinine, Ser: 5.92 mg/dL — ABNORMAL HIGH (ref 0.50–1.35)
GFR calc non Af Amer: 7 mL/min — ABNORMAL LOW (ref >90)
GFR, EST AFRICAN AMERICAN: 9 mL/min — AB (ref >90)
Glucose, Bld: 134 mg/dL — ABNORMAL HIGH (ref 70–99)
PHOSPHORUS: 5.2 mg/dL — AB (ref 2.3–4.6)
Potassium: 5.6 mEq/L — ABNORMAL HIGH (ref 3.7–5.3)
SODIUM: 142 meq/L (ref 137–147)

## 2013-03-28 LAB — CLOSTRIDIUM DIFFICILE BY PCR: Toxigenic C. Difficile by PCR: NEGATIVE

## 2013-03-28 LAB — CBC
HCT: 29.2 % — ABNORMAL LOW (ref 39.0–52.0)
Hemoglobin: 9.6 g/dL — ABNORMAL LOW (ref 13.0–17.0)
MCH: 32.7 pg (ref 26.0–34.0)
MCHC: 32.9 g/dL (ref 30.0–36.0)
MCV: 99.3 fL (ref 78.0–100.0)
PLATELETS: 158 10*3/uL (ref 150–400)
RBC: 2.94 MIL/uL — ABNORMAL LOW (ref 4.22–5.81)
RDW: 14.6 % (ref 11.5–15.5)
WBC: 13.1 10*3/uL — AB (ref 4.0–10.5)

## 2013-03-28 LAB — HEPATIC FUNCTION PANEL
ALT: 27 U/L (ref 0–53)
AST: 26 U/L (ref 0–37)
Albumin: 2.8 g/dL — ABNORMAL LOW (ref 3.5–5.2)
Alkaline Phosphatase: 99 U/L (ref 39–117)
Total Bilirubin: <0.2 mg/dL — ABNORMAL LOW (ref 0.3–1.2)
Total Protein: 6 g/dL (ref 6.0–8.3)

## 2013-03-28 LAB — TSH: TSH: 1.585 u[IU]/mL (ref 0.350–4.500)

## 2013-03-28 LAB — TROPONIN I: Troponin I: <0.3 ng/mL (ref <0.30)

## 2013-03-28 MED ORDER — NEPRO/CARBSTEADY PO LIQD
237.0000 mL | Freq: Two times a day (BID) | ORAL | Status: DC
  Administered 2013-03-29 – 2013-04-03 (×8): 237 mL via ORAL
  Filled 2013-03-28 (×3): qty 237

## 2013-03-28 MED ORDER — DARBEPOETIN ALFA-POLYSORBATE 60 MCG/0.3ML IJ SOLN
INTRAMUSCULAR | Status: AC
  Filled 2013-03-28: qty 0.3

## 2013-03-28 MED ORDER — ALBUTEROL SULFATE (2.5 MG/3ML) 0.083% IN NEBU: 2.5000 mg | INHALATION_SOLUTION | RESPIRATORY_TRACT | Status: DC | PRN

## 2013-03-28 MED ORDER — ALBUTEROL SULFATE (2.5 MG/3ML) 0.083% IN NEBU
2.5000 mg | INHALATION_SOLUTION | Freq: Four times a day (QID) | RESPIRATORY_TRACT | Status: DC
  Administered 2013-03-28 – 2013-03-29 (×3): 2.5 mg via RESPIRATORY_TRACT
  Filled 2013-03-28 (×4): qty 3

## 2013-03-28 MED ORDER — LEVOFLOXACIN IN D5W 750 MG/150ML IV SOLN
750.0000 mg | Freq: Once | INTRAVENOUS | Status: AC
  Administered 2013-03-28: 750 mg via INTRAVENOUS
  Filled 2013-03-28: qty 150

## 2013-03-28 MED ORDER — LEVOFLOXACIN IN D5W 500 MG/100ML IV SOLN: 500.0000 mg | INTRAVENOUS | Status: DC

## 2013-03-28 MED ORDER — ALBUTEROL SULFATE (2.5 MG/3ML) 0.083% IN NEBU: 2.5000 mg | INHALATION_SOLUTION | Freq: Four times a day (QID) | RESPIRATORY_TRACT | Status: DC | PRN

## 2013-03-28 MED ORDER — DOXERCALCIFEROL 4 MCG/2ML IV SOLN
INTRAVENOUS | Status: AC
  Administered 2013-03-28: 1 ug via INTRAVENOUS
  Filled 2013-03-28: qty 2

## 2013-03-28 MED ORDER — DOXERCALCIFEROL 4 MCG/2ML IV SOLN
1.0000 ug | INTRAVENOUS | Status: DC
  Administered 2013-03-28 – 2013-04-02 (×3): 1 ug via INTRAVENOUS
  Filled 2013-03-28 (×3): qty 2

## 2013-03-28 MED ORDER — DARBEPOETIN ALFA-POLYSORBATE 60 MCG/0.3ML IJ SOLN
60.0000 ug | INTRAMUSCULAR | Status: DC
  Filled 2013-03-28: qty 0.3

## 2013-03-28 MED ORDER — RENA-VITE PO TABS
1.0000 | ORAL_TABLET | Freq: Every day | ORAL | Status: DC
  Administered 2013-03-29 – 2013-04-02 (×4): 1 via ORAL
  Filled 2013-03-28 (×7): qty 1

## 2013-03-28 NOTE — Procedures (Signed)
I have personally attended this patient's dialysis session.   2K bath.  TDC 400.  HR 55 sinus brady     Cenia Zaragosa B

## 2013-03-28 NOTE — Progress Notes (Signed)
  Echocardiogram 2D Echocardiogram has been performed.  Mauricio Po 03/28/2013, 3:29 PM

## 2013-03-28 NOTE — Progress Notes (Signed)
Utilization review completed.  

## 2013-03-28 NOTE — Progress Notes (Signed)
[facility] Progress Note [place] TEAM 1 - Stepdown/ICU TEAM   Logan [name] GNF:[id number] DOB: [birth year] DOA: 03/27/2013 PCP: Lucrezia Starch, MD  Admit HPI / Brief Narrative: 80 y.o. male with a history of end-stage renal disease, dementia, and peripheral vascular disease, who presented to the hospital with a complaint of chest pain and cough. The patient was lethargic and unable to provide a reliable history. Over 2 weeks the patient reportedly had a cough which had been nonproductive. He complained of central chest pain during the cough. There had been no associated diaphoresis or radiation of the pain. He had some shortness of breath. He had chills, but no fever. He also had diarrhea numbering one to 2 watery stools daily. He has a history of C. difficile in the past. Of note, he was started on treatment with clindamycin for a mildly infected second toe by his Podiatrist on March 12, 2013. He was given his last dose on March 24, 2013 because his daughter was concerned that the clindamycin was causing the diarrhea.   In the emergency department, he was mildly febrile with temperature 100.8. He was hypertensive with blood pressure in the 846N to 629B systolically. His lab data were significant for WBC of 16.8, hemoglobin 11.1, potassium of 5.5, BUN of 52, and creatinine is 5.27. His chest x-ray revealed stable cardiomegaly and chronic lung disease. His EKG revealed atrial fibrillation with a heart rate of 64 beats per minute.   HPI/Subjective: The patient is somewhat somnolent but able to be awoken at the time of my visit.  He is quite pleasant and denies chest pain or trouble breathing.  Assessment/Plan:  Acute respiratory distress Influenza negative - no focal infiltrate note on CXR - O2 sats stable - appears to have stabilized/resolved for now   ?Chest pain Trop negative x3 in this HD pt - no c/o pain at this time   Sinus Loletha Grayer Stop coreg and aricept - follow on tele  - this could have led to resp distress via poor CO   ?Afib Review of EKGs does not reveal convincing evidence of Afib to my eye - follow on tele - at present bradycardia is the biggest concern   Diarrhea  C diff negative - appears to have slowed/ceased - follow   ESRD on M/W/F HD  Nephrology has been alerted - no indication for emergent HD today   Dementia Not agitated - quite pleasant   HTN Follow w/o change today in setting of marked bradycardia  PVD s/p L AKA   Code Status: DNR Family Communication: Spoke with wife and daughter at length at bedside Disposition Plan: Stable for transfer to telemetry bed  Consultants: Nephrology  Procedures: none  Antibiotics: Maxipime 1/6 Flagyl 1/6 Levaquin 1/7 Tamiflu 1/6  DVT prophylaxis: Lovenox  Objective: Blood pressure 124/48, pulse 61, temperature 98.2 F (36.8 C), temperature source Oral, resp. rate 15, height 5' 10.08" (1.78 m), weight 68.9 kg (151 lb 14.4 oz), SpO2 100.00%.  Intake/Output Summary (Last 24 hours) at 03/28/13 1736 Last data filed at 03/27/13 2300  Gross per 24 hour  Intake  146.5 ml  Output      0 ml  Net  146.5 ml   Exam: General: No acute respiratory distress at rest Lungs: Very mild bibasilar crackles with no wheeze Cardiovascular: Bradycardic rate but regular rhythm at present Abdomen: Nontender, nondistended, soft, bowel sounds positive, no rebound, no ascites, no appreciable mass Extremities: No significant cyanosis, clubbing, or significant edema   Data  Reviewed: Basic Metabolic Panel:  Recent Labs Lab 03/27/13 1450 03/28/13 0210  NA 141 142  K 5.5* 5.6*  CL 95* 97  CO2 29 26  GLUCOSE 145* 134*  BUN 52* 66*  CREATININE 5.27* 5.92*  CALCIUM 9.0 8.6  PHOS  --  5.2*   Liver Function Tests:  Recent Labs Lab 03/27/13 1450 03/28/13 0210  AST 23 26  ALT 21 27  ALKPHOS 120* 99  BILITOT 0.2* <0.2*  PROT 6.8 6.0  ALBUMIN 3.2* 2.8*  2.8*    Recent Labs Lab 03/27/13 1450   LIPASE 79*   CBC:  Recent Labs Lab 03/27/13 1450 03/28/13 0210  WBC 16.8* 13.1*  NEUTROABS 13.3*  --   HGB 11.1* 9.6*  HCT 34.0* 29.2*  MCV 100.6* 99.3  PLT 202 158   Cardiac Enzymes:  Recent Labs Lab 03/27/13 1946 03/28/13 0210 03/28/13 0725  TROPONINI <0.30 <0.30 <0.30   CBG:  Recent Labs Lab 03/27/13 1856  GLUCAP 123*    Recent Results (from the past 240 hour(s))  MRSA PCR SCREENING     Status: None   Collection Time    03/27/13  8:21 PM      Result Value Range Status   MRSA by PCR NEGATIVE  NEGATIVE Final   Comment:            The GeneXpert MRSA Assay (FDA     approved for NASAL specimens     only), is one component of a     comprehensive MRSA colonization     surveillance program. It is not     intended to diagnose MRSA     infection nor to guide or     monitor treatment for     MRSA infections.  CLOSTRIDIUM DIFFICILE BY PCR     Status: None   Collection Time    03/27/13 11:09 PM      Result Value Range Status   C difficile by pcr NEGATIVE  NEGATIVE Final     Studies:  Recent x-ray studies have been reviewed in detail by the Attending Physician  Scheduled Meds:  Scheduled Meds: . albuterol  2.5 mg Nebulization QID  . aspirin EC  81 mg Oral QHS  . benzonatate  100 mg Oral TID  . calcium acetate  1,334 mg Oral TID WC  . cinacalcet  30 mg Oral QPM  . darbepoetin (ARANESP) injection - DIALYSIS  60 mcg Intravenous Q Wed-HD  . doxercalciferol  1 mcg Intravenous Q M,W,F-HD  . enoxaparin (LOVENOX) injection  30 mg Subcutaneous Q24H  . feeding supplement (NEPRO CARB STEADY)  237 mL Oral BID BM  . isosorbide mononitrate  60 mg Oral Daily  . multivitamin  1 tablet Oral QHS  . simvastatin  10 mg Oral QHS    Time spent on care of this patient: 35 mins   [place]  [phone] Pager - Text Page per Shea Evans as per below:  On-Call/Text Page:      [email or web address]      password [email or web address]  If 7PM-7AM, please contact  night-coverage [email or web address] Password [email or web address] 03/28/2013, 5:36 PM   LOS: 1 day

## 2013-03-28 NOTE — Consult Note (Signed)
[facility] Renal Consultation Note    Indication for Consultation:  Management of ESRD/hemodialysis; anemia, hypertension/volume and secondary hyperparathyroidism  HPI: Logan [name] is a 80 y.o. male ESRD patient (MWF HD) with multiple medical problems who presented to the ED via EMS yesterday afternoon with complaints of shortness of breath and chest pain. His wife and daughter are at the bedside assisting with history and state that he has also had poor appetite, cough productive of thin, white sputum, as well as diarrhea for several days believed secondary to clindamycin prescribed for ulcer on his 2nd right toe.  Cardiac enzymes were cycled and negative x 3. CXR was unremarkable. Influenza and C Diff panels were also negative. Echocardiogram is still pending (strong family cardiac history.) At the time of this encounter, he is resting in bed and has no complaints other than an occasional cough. He is somewhat lethargic but I am told that this is his baseline. He denies further diarrhea or chest pain. His last hemodialysis session was on 1/5 and there were no issues per patient, family and outpatient notes.  Dialysis Orders: Center: [facility]  on MWF . EDW 69 kg HD Bath2K/2.25Ca  Time 4:00 Heparin 7500. Access R TDC BFR 400 DFR 800    Hectorol 39mcg IV/HD Epogen 3400  Units IV/HD  Venofer  0  Recent Labs: Hgb 10.7, Tsat 42%, P 5.3, PTH 287 in Oct   Past Medical History  Diagnosis Date  . Hypertension   . ESRD on dialysis     MONDAY, [place], and FRIDAY  . Stroke 2005  . Anemia   . Secondary hyperparathyroidism   . Peripheral vascular disease, unspecified   . Basal cell carcinoma   . BPH (benign prostatic hypertrophy)   . History of Clostridium difficile infection   . Cataract   . Abnormal nuclear stress test 2005    Mild distal anterior ischemia on nuclear test  . Shortness of breath   . Dementia   . C. difficile colitis    Past Surgical History  Procedure  Laterality Date  . Insertion of dialysis catheter  09/27/2010    Left IJ Diatek Catheter  . Above knee leg amputation  12/26/2004    Left AKA by Dr. Oneida Alar  . Tonsillectomy and adenoidectomy    . Thrombectomy / arteriovenous graft revision      Numerous revision and declots  . Femoral bypass  2005    Left Femoral to posterior tibial BPG w/ composite PTFE and vein  . Hernia repair    . Appendectomy    . Cataract extraction      Left eye   . Arteriovenous graft placement     Family History  Problem Relation Age of Onset  . Heart disease Maternal Uncle   . Heart attack Father     52's   . Heart attack Sister     22's    Social History: Resides in an assisted living facility with his spouse  reports that he has never smoked. He has never used smokeless tobacco. He reports that he does not drink alcohol or use illicit drugs.  No Known Allergies Prior to Admission medications   Medication Sig Start Date End Date Taking? Authorizing Provider  albuterol (PROVENTIL) (5 MG/ML) 0.5% nebulizer solution Take 2.5 mg by nebulization every 6 (six) hours as needed for wheezing.   Yes Historical Provider, MD  aspirin EC 81 MG tablet Take 81 mg by mouth at bedtime.   Yes Historical Provider,  MD  calcium acetate (PHOSLO) 667 MG capsule Take 1,334 mg by mouth 3 (three) times daily with meals.   Yes Historical Provider, MD  carisoprodol (SOMA) 350 MG tablet Take 350 mg by mouth 4 (four) times daily as needed. For cramps   Yes Historical Provider, MD  carvedilol (COREG) 3.125 MG tablet Take 3.125 mg by mouth 2 (two) times daily with a meal.  02/05/13  Yes Mahima Pandey, MD  cinacalcet (SENSIPAR) 30 MG tablet Take 30 mg by mouth every evening.   Yes Historical Provider, MD  donepezil (ARICEPT) 5 MG tablet Take 1 tablet (5 mg total) by mouth at bedtime. 01/30/13  Yes Mahima Bubba Camp, MD  isosorbide mononitrate (IMDUR) 60 MG 24 hr tablet Take 1 tablet (60 mg total) by mouth daily. 05/17/11 03/22/97 Yes Robbie Lis, MD  multivitamin (RENA-VIT) TABS tablet Take 1 tablet by mouth at bedtime.   Yes Historical Provider, MD  simvastatin (ZOCOR) 10 MG tablet Take 10 mg by mouth at bedtime.   Yes Historical Provider, MD  thiamine (VITAMIN B-1) 100 MG tablet Take 100 mg by mouth at bedtime.   Yes Historical Provider, MD   Current Facility-Administered Medications  Medication Dose Route Frequency Provider Last Rate Last Dose  . acetaminophen (TYLENOL) tablet 650 mg  650 mg Oral Q6H PRN Rexene Alberts, MD       Or  . acetaminophen (TYLENOL) suppository 650 mg  650 mg Rectal Q6H PRN Rexene Alberts, MD      . albuterol (PROVENTIL) (2.5 MG/3ML) 0.083% nebulizer solution 2.5 mg  2.5 mg Nebulization Q2H PRN Cherene Altes, MD      . albuterol (PROVENTIL) (2.5 MG/3ML) 0.083% nebulizer solution 2.5 mg  2.5 mg Nebulization QID Cherene Altes, MD   2.5 mg at 03/28/13 1215  . aspirin EC tablet 81 mg  81 mg Oral QHS Rexene Alberts, MD      . benzonatate (TESSALON) capsule 100 mg  100 mg Oral TID Rexene Alberts, MD   100 mg at 03/28/13 1118  . calcium acetate (PHOSLO) capsule 1,334 mg  1,334 mg Oral TID WC Rexene Alberts, MD   1,334 mg at 03/28/13 1400  . carisoprodol (SOMA) tablet 350 mg  350 mg Oral BID PRN Rexene Alberts, MD      . cinacalcet (SENSIPAR) tablet 30 mg  30 mg Oral QPM Rexene Alberts, MD   30 mg at 03/27/13 2127  . darbepoetin (ARANESP) injection 60 mcg  60 mcg Intravenous Q Wed-HD Alvia Grove, PA-C      . doxercalciferol (HECTOROL) injection 1 mcg  1 mcg Intravenous Q M,W,F-HD Alvia Grove, PA-C      . enoxaparin (LOVENOX) injection 30 mg  30 mg Subcutaneous Q24H Rexene Alberts, MD   30 mg at 03/27/13 2127  . feeding supplement (NEPRO CARB STEADY) liquid 237 mL  237 mL Oral BID BM Alvia Grove, PA-C      . guaiFENesin-dextromethorphan (ROBITUSSIN DM) 100-10 MG/5ML syrup 5 mL  5 mL Oral Q4H PRN Rexene Alberts, MD      . isosorbide mononitrate (IMDUR) 24 hr tablet 60 mg  60 mg Oral Daily Rexene Alberts, MD   60 mg at 03/28/13 1117  . multivitamin (RENA-VIT) tablet 1 tablet  1 tablet Oral QHS Alvia Grove, PA-C      . ondansetron [facility]) tablet 4 mg  4 mg Oral Q6H PRN Rexene Alberts, MD       Or  . ondansetron [facility])  injection 4 mg  4 mg Intravenous Q6H PRN Rexene Alberts, MD      . simvastatin (ZOCOR) tablet 10 mg  10 mg Oral QHS Rexene Alberts, MD   10 mg at 03/27/13 2127   Labs: Basic Metabolic Panel:  Recent Labs Lab 03/27/13 1450 03/28/13 0210  NA 141 142  K 5.5* 5.6*  CL 95* 97  CO2 29 26  GLUCOSE 145* 134*  BUN 52* 66*  CREATININE 5.27* 5.92*  CALCIUM 9.0 8.6  PHOS  --  5.2*   Liver Function Tests:  Recent Labs Lab 03/27/13 1450 03/28/13 0210  AST 23 26  ALT 21 27  ALKPHOS 120* 99  BILITOT 0.2* <0.2*  PROT 6.8 6.0  ALBUMIN 3.2* 2.8*  2.8*    Recent Labs Lab 03/27/13 1450  LIPASE 79*   CBC:  Recent Labs Lab 03/27/13 1450 03/28/13 0210  WBC 16.8* 13.1*  NEUTROABS 13.3*  --   HGB 11.1* 9.6*  HCT 34.0* 29.2*  MCV 100.6* 99.3  PLT 202 158   Cardiac Enzymes:  Recent Labs Lab 03/27/13 1946 03/28/13 0210 03/28/13 0725  TROPONINI <0.30 <0.30 <0.30   CBG:  Recent Labs Lab 03/27/13 1856  GLUCAP 123*   Studies/Results: Dg Chest Portable 1 View  03/27/2013   CLINICAL DATA:  Chest pain and shortness of breath.  EXAM: PORTABLE CHEST - 1 VIEW  COMPARISON:  04/20/2012  FINDINGS: Stable appearance of dialysis catheter and chronic lung disease. Stable chronic pleural thickening in the right hemithorax. No overt edema or airspace consolidation is seen. There is stable cardiomegaly. No significant pleural fluid.  IMPRESSION: Stable cardiomegaly and chronic lung disease.   Electronically Signed   By: Aletta Edouard M.D.   On: 03/27/2013 15:16    ROS: Poor appetite, productive cough. 10 pt ROS asked and answered. All other systems negative except as in HPI above.   Physical Exam: Filed Vitals:   03/28/13 0809 03/28/13 0846 03/28/13 1125  03/28/13 1215  BP:  150/44 112/41   Pulse:  69 53   Temp:   98.2 F (36.8 C)   TempSrc:   Oral   Resp:   11   Height:      Weight:      SpO2: 98%  100% 98%     General: Elderly, chronically ill-appearing.  in no acute distress. Head: Normocephalic, atraumatic, sclera non-icteric, mucus membranes are moist, dentition in poor repair Neck: Supple. JVD not elevated. Lungs: Coarse breath sounds. No appreciable wheezes, rales, or rhonchi. Breathing is unlabored on 2L 02 via [place] Heart: Brady irregular. HR 40s -50s on tele with ectopic beats. No murmurs, rubs, or gallops appreciated. Abdomen: Soft, non-tender, non-distended with normoactive bowel sounds. No rebound/guarding. No obvious abdominal masses. M-S:  Strength and tone appear normal for age. Lower extremities: L AKA with no ulceration. RLE without edema or ischemic changes, 1cm healing ulceration on Rt 2nd toe.  Neuro: Somnolent but arouses easily. Moves all extremities spontaneously. Psych:  Responds to questions appropriately with a normal affect. Dialysis Access: R TDC (UE options for perm access exhausted. Not a candidate for thigh AVG) no drainage or tenderness   Assessment/Plan: 1. Chest Pain - Resolved. Work-up per primary in progess. Cough possibly contributing. Questionable new onset AFib on admit. Now sinus brady with PACs (chronic). Cardiac enzymes negative x 3. Echo pending currently being done). Bradycardic intermittently into the 40's not usual for him; observe on tele  2. Acute bronchitis - Given Vanc/cefepime in ED. Leukocytosis improving.  Influenza panel negative. Now on po Levaquin per primary.Mentioned in the primary notes but ordered to be given only once 3. Diarrhea - C. Diff negative.  4. ESRD -  MWF, K+ 5.6 s/p 15 gm kayexalate yesterday evening. HD today to stay on schedule. 5. Hypertension/volume  - SBPs 110's on home Coreg. CXR clear. Under EDW. Try for 1L.  May need lower edw at d/c 6. Anemia  - Hgb 9.6 < 10.7  outpatient. Epo recently decreased. Aranesp 60 for here. Last Tsat 42%. No Fe 7. Metabolic bone disease -  Ca 8.6 (9.6 corrected) P 5.3 on Phoslo 2 ac. PTH within range on Vit D and Sensipar 30. 8. Nutrition - Alb 2.8. High protein renal diet, multivitamin, add ONS  Logan Alamo, PA-C [facility] Pager [phone] 03/28/2013, 2:23 PM  I have seen and examined this patient and agree with plan as outlined above by Doristine Counter PA.  Elderly WM with ESRD, PAD, presented with CP, cough, diarrhea, leukocytosis (trops neg, CXR no inflitrate, flu panel neg, CDiff neg). S/p vanco/cefipime X 1 each, and a single dose of levaquin.  Comfortable without c/o at present without CP or SOB.  Echocardiogram underway.  Concerned about degree of bradycardia he is exhibiting.  Will review HD records but this would appear too be more pronounced than what we have observed at the outpt HD unit. For HD later today. Lakendria Nicastro B,MD 03/28/2013 3:24 PM

## 2013-03-29 DIAGNOSIS — N039 Chronic nephritic syndrome with unspecified morphologic changes: Secondary | ICD-10-CM

## 2013-03-29 DIAGNOSIS — I498 Other specified cardiac arrhythmias: Secondary | ICD-10-CM

## 2013-03-29 DIAGNOSIS — D631 Anemia in chronic kidney disease: Secondary | ICD-10-CM

## 2013-03-29 DIAGNOSIS — N189 Chronic kidney disease, unspecified: Secondary | ICD-10-CM

## 2013-03-29 DIAGNOSIS — F0150 Vascular dementia without behavioral disturbance: Secondary | ICD-10-CM

## 2013-03-29 LAB — CBC
HCT: 28.2 % — ABNORMAL LOW (ref 39.0–52.0)
Hemoglobin: 9.3 g/dL — ABNORMAL LOW (ref 13.0–17.0)
MCH: 32.1 pg (ref 26.0–34.0)
MCHC: 33 g/dL (ref 30.0–36.0)
MCV: 97.2 fL (ref 78.0–100.0)
PLATELETS: 134 10*3/uL — AB (ref 150–400)
RBC: 2.9 MIL/uL — ABNORMAL LOW (ref 4.22–5.81)
RDW: 14.6 % (ref 11.5–15.5)
WBC: 9 10*3/uL (ref 4.0–10.5)

## 2013-03-29 MED ORDER — LEVOFLOXACIN IN D5W 750 MG/150ML IV SOLN
750.0000 mg | INTRAVENOUS | Status: DC
  Administered 2013-03-30: 750 mg via INTRAVENOUS
  Filled 2013-03-29: qty 150

## 2013-03-29 NOTE — Progress Notes (Addendum)
[facility] Progress Note    Logan [name] MGQ:[id number] DOB: October 23, 1919 DOA: 03/27/2013 PCP: Lucrezia Starch, MD  Admit HPI / Brief Narrative: 80 y.o. male with a history of end-stage renal disease, dementia, and peripheral vascular disease, who presented to the hospital with a complaint of chest pain and cough. The patient was lethargic and unable to provide a reliable history. Over 2 weeks the patient reportedly had a cough which had been nonproductive. He complained of central chest pain during the cough. There had been no associated diaphoresis or radiation of the pain. He had some shortness of breath. He had chills, but no fever. He also had diarrhea numbering one to 2 watery stools daily. He has a history of C. difficile in the past. Of note, he was started on treatment with clindamycin for a mildly infected second toe by his Podiatrist on March 12, 2013. He was given his last dose on March 24, 2013 because his daughter was concerned that the clindamycin was causing the diarrhea.   In the emergency department, he was mildly febrile with temperature 100.8. He was hypertensive with blood pressure in the 267T to 245Y systolically. His lab data were significant for WBC of 16.8, hemoglobin 11.1, potassium of 5.5, BUN of 52, and creatinine is 5.27. His chest x-ray revealed stable cardiomegaly and chronic lung disease. His EKG revealed atrial fibrillation with a heart rate of 64 beats per minute.   HPI/Subjective: Wife at bedside No CP, no SOB   Assessment/Plan:  Acute respiratory distress Influenza negative - no focal infiltrate note on CXR - O2 sats stable - appears to have stabilized/resolved for now   ?Chest pain Trop negative x3 in this HD pt - no c/o pain at this time   Sinus Loletha Grayer Stop coreg and aricept - follow on tele - this could have led to resp distress via poor CO   ?Afib Review of EKGs does not reveal convincing evidence of Afib to my eye - follow on tele -  at present bradycardia is the biggest concern   Diarrhea  C diff negative - appears to have slowed/ceased - follow   ESRD on M/W/F HD  Nephrology has been alerted - no indication for emergent HD today   Dementia Not agitated - quite pleasant   HTN Follow w/o change today in setting of marked bradycardia  PVD s/p L AKA   Code Status: DNR Family Communication: Spoke with wife  Disposition Plan: from [employer]  Consultants: Nephrology  Procedures: none  Antibiotics: Maxipime 1/6- stop Flagyl 1/6- stop Levaquin 1/7- stop Tamiflu 1/6- stop  DVT prophylaxis: Lovenox  Objective: Blood pressure 107/44, pulse 63, temperature 99.4 F (37.4 C), temperature source Oral, resp. rate 21, height 5' 10.08" (1.78 m), weight 70.9 kg (156 lb 4.9 oz), SpO2 95.00%.  Intake/Output Summary (Last 24 hours) at 03/29/13 0951 Last data filed at 03/29/13 0900  Gross per 24 hour  Intake    180 ml  Output   1010 ml  Net   -830 ml   Exam: General: No acute respiratory distress at rest Lungs: Very mild bibasilar crackles with no wheeze Cardiovascular: Bradycardic rate but regular rhythm at present Abdomen: Nontender, nondistended, soft, bowel sounds positive, no rebound, no ascites, no appreciable mass Extremities: No significant cyanosis, clubbing, or significant edema   Data Reviewed: Basic Metabolic Panel:  Recent Labs Lab 03/27/13 1450 03/28/13 0210  NA 141 142  K 5.5* 5.6*  CL 95* 97  CO2 29 26  GLUCOSE  145* 134*  BUN 52* 66*  CREATININE 5.27* 5.92*  CALCIUM 9.0 8.6  PHOS  --  5.2*   Liver Function Tests:  Recent Labs Lab 03/27/13 1450 03/28/13 0210  AST 23 26  ALT 21 27  ALKPHOS 120* 99  BILITOT 0.2* <0.2*  PROT 6.8 6.0  ALBUMIN 3.2* 2.8*  2.8*    Recent Labs Lab 03/27/13 1450  LIPASE 79*   CBC:  Recent Labs Lab 03/27/13 1450 03/28/13 0210 03/29/13 0549  WBC 16.8* 13.1* 9.0  NEUTROABS 13.3*  --   --   HGB 11.1* 9.6* 9.3*  HCT 34.0* 29.2*  28.2*  MCV 100.6* 99.3 97.2  PLT 202 158 134*   Cardiac Enzymes:  Recent Labs Lab 03/27/13 1946 03/28/13 0210 03/28/13 0725  TROPONINI <0.30 <0.30 <0.30   CBG:  Recent Labs Lab 03/27/13 1856  GLUCAP 123*    Recent Results (from the past 240 hour(s))  MRSA PCR SCREENING     Status: None   Collection Time    03/27/13  8:21 PM      Result Value Range Status   MRSA by PCR NEGATIVE  NEGATIVE Final   Comment:            The GeneXpert MRSA Assay (FDA     approved for NASAL specimens     only), is one component of a     comprehensive MRSA colonization     surveillance program. It is not     intended to diagnose MRSA     infection nor to guide or     monitor treatment for     MRSA infections.  CLOSTRIDIUM DIFFICILE BY PCR     Status: None   Collection Time    03/27/13 11:09 PM      Result Value Range Status   C difficile by pcr NEGATIVE  NEGATIVE Final      Scheduled Meds:  Scheduled Meds: . albuterol  2.5 mg Nebulization QID  . aspirin EC  81 mg Oral QHS  . benzonatate  100 mg Oral TID  . calcium acetate  1,334 mg Oral TID WC  . cinacalcet  30 mg Oral QPM  . darbepoetin (ARANESP) injection - DIALYSIS  60 mcg Intravenous Q Wed-HD  . doxercalciferol  1 mcg Intravenous Q M,W,F-HD  . enoxaparin (LOVENOX) injection  30 mg Subcutaneous Q24H  . feeding supplement (NEPRO CARB STEADY)  237 mL Oral BID BM  . isosorbide mononitrate  60 mg Oral Daily  . multivitamin  1 tablet Oral QHS  . simvastatin  10 mg Oral QHS    Time spent on care of this patient: 35 mins   Logan [name], [facility] [phone] Office  [phone] Pager - Text Page per Shea Evans as per below:  On-Call/Text Page:      [email or web address]      password [email or web address]  If 7PM-7AM, please contact night-coverage [email or web address] Password [email or web address] 03/29/2013, 9:51 AM   LOS: 2 days

## 2013-03-29 NOTE — Progress Notes (Signed)
I have seen and examined this patient and agree with plan as outlined by MBergman, [place]. He is more alert today, still coughing.  WBC improved after vanco/cefipime and 1 dose levaquin.  Continued levaquin reasonable (next due 1/9) given WBC response, bronchitis symptoms in this elderly gentleman.    Remains somewhat bradycardic in 50-70's. Trops neg. ECHO reasonable LV, mild AS. Next HD Friday 1/9. Weights discrepant. See note. Jacqueline Delapena B,MD 03/29/2013 12:10 PM

## 2013-03-29 NOTE — Progress Notes (Signed)
Pt orientation to unit, room and routine. SR up x 2, fall risk assessment complete with Patient and family verbalizing understanding of risks associated with falls. Pt verbalizes an understanding of how to use the call bell and to call for help before getting out of bed.  Will cont to monitor and assist as needed.  Velora Mediate, RN

## 2013-03-29 NOTE — Progress Notes (Signed)
[facility] Progress Note  Subjective:   No c/o; no prob with HD  Objective Filed Vitals:   03/28/13 2205 03/29/13 0450 03/29/13 0826 03/29/13 1021  BP:  119/54 107/44   Pulse:  65 63   Temp:  99.3 F (37.4 C) 99.4 F (37.4 C)   TempSrc:  Oral Oral   Resp:  24 21   Height:      Weight:      SpO2: 94% 91% 95% 93%   Physical Exam General: NAd sitting in chair, wearing O2 Heart: irreg - rate 60s Lungs:  occ soft exp wheeze coarse BS clear somewhat wth cough Abdomen: obese soft NT Extremities: left AKA and right LE - no edema Dialysis Access: right IJ perm cath  Dialysis Orders: Center: [facility] on MWF .  EDW 69 kg HD Bath2K/2.25Ca Time 4:00 Heparin 7500. Access R TDC BFR 400 DFR 800  Hectorol 23mcg IV/HD Epogen 3400 Units IV/HD Venofer 0  Recent Labs: Hgb 10.7, Tsat 42%, P 5.3, PTH 287 in Oct  Assessment/Plan:  1. Chest Pain - Resolved. Work-up per primary in progess. Cough possibly contributing. Questionable new onset AFib on admit. Now sinus brady with PACs (chronic). Cardiac enzymes negative x 3. Echo 55-60% will mild AS Bradycardic intermittently into the 40's not usual for him; observe on tele (50 - 70s here; coreg d/c 2. Acute bronchitis - Low grade temp given Vanc/cefepime in ED and one dose levaquin IV 1/07, nebs and tessalon perles; Leukocytosis much improved; would be due for next dose levaquin 1/09 - could give as PO d/w primary - would be prudent with age and degree of immunocompromisation.. Influenza panel negative.  3. Diarrhea - C. Diff negative.  4. ESRD - MWF, HD in am - recheck K was hyperkalemic 5. Hypertension/volume - BP variable Coreg dc CXR clear. Under EDW admission. UF 1 L yesterday - but post weight 71.3  With EDW 69 - not clear of true weight - set goal for 1 - 1.5 L for Friday and use crit line to assess. 6. Anemia - Hgb 9.3<9.6 < 10.7 outpatient. Epo recently decreased. Aranesp 60 for here. Last Tsat 42%. No Fe - not clear why Hgb dropped;  recheck pre HD Friday 7. Metabolic bone disease - Ca 8.6 (9.6 corrected) P 5.3 on Phoslo 2 ac. PTH within range on Vit D and Sensipar 30. 8. Nutrition - Alb 2.8. High protein renal diet, multivitamin, add ONS 9. Afib - off coreg - HR 60s  Myriam Jacobson, PA-C [facility] Beeper [phone] 03/29/2013,10:48 AM  LOS: 2 days    Additional Objective Labs: Basic Metabolic Panel:  Recent Labs Lab 03/27/13 1450 03/28/13 0210  NA 141 142  K 5.5* 5.6*  CL 95* 97  CO2 29 26  GLUCOSE 145* 134*  BUN 52* 66*  CREATININE 5.27* 5.92*  CALCIUM 9.0 8.6  PHOS  --  5.2*   Liver Function Tests:  Recent Labs Lab 03/27/13 1450 03/28/13 0210  AST 23 26  ALT 21 27  ALKPHOS 120* 99  BILITOT 0.2* <0.2*  PROT 6.8 6.0  ALBUMIN 3.2* 2.8*  2.8*    Recent Labs Lab 03/27/13 1450  LIPASE 79*   CBC:  Recent Labs Lab 03/27/13 1450 03/28/13 0210 03/29/13 0549  WBC 16.8* 13.1* 9.0  NEUTROABS 13.3*  --   --   HGB 11.1* 9.6* 9.3*  HCT 34.0* 29.2* 28.2*  MCV 100.6* 99.3 97.2  PLT 202 158 134*   Blood Culture  Component Value Date/Time   SDES BLOOD LEFT HAND 09/26/2010 1800   SPECREQUEST BOTTLES DRAWN AEROBIC AND ANAEROBIC 10CC 09/26/2010 1800   CULT NO GROWTH 5 DAYS 09/26/2010 1800   REPTSTATUS 10/03/2010 FINAL 09/26/2010 1800    Cardiac Enzymes:  Recent Labs Lab 03/27/13 1946 03/28/13 0210 03/28/13 0725  TROPONINI <0.30 <0.30 <0.30   CBG:  Recent Labs Lab 03/27/13 1856  GLUCAP 123*  Studies/Results: Dg Chest Portable 1 View  03/27/2013   CLINICAL DATA:  Chest pain and shortness of breath.  EXAM: PORTABLE CHEST - 1 VIEW  COMPARISON:  04/20/2012  FINDINGS: Stable appearance of dialysis catheter and chronic lung disease. Stable chronic pleural thickening in the right hemithorax. No overt edema or airspace consolidation is seen. There is stable cardiomegaly. No significant pleural fluid.  IMPRESSION: Stable cardiomegaly and chronic lung disease.   Electronically  Signed   By: Aletta Edouard M.D.   On: 03/27/2013 15:16   Medications:   . albuterol  2.5 mg Nebulization QID  . aspirin EC  81 mg Oral QHS  . benzonatate  100 mg Oral TID  . calcium acetate  1,334 mg Oral TID WC  . cinacalcet  30 mg Oral QPM  . darbepoetin (ARANESP) injection - DIALYSIS  60 mcg Intravenous Q Wed-HD  . doxercalciferol  1 mcg Intravenous Q M,W,F-HD  . enoxaparin (LOVENOX) injection  30 mg Subcutaneous Q24H  . feeding supplement (NEPRO CARB STEADY)  237 mL Oral BID BM  . isosorbide mononitrate  60 mg Oral Daily  . multivitamin  1 tablet Oral QHS  . simvastatin  10 mg Oral QHS

## 2013-03-29 NOTE — Evaluation (Signed)
Physical Therapy Evaluation Patient Details Name: Logan [name] MRN: [id number] DOB: [birth year] Today's Date: 03/29/2013 Time: 1000-1028 PT Time Calculation (min): 28 min  PT Assessment / Plan / Recommendation History of Present Illness  Logan [name] is a 80 y.o. male with a history of end-stage renal disease, dementia, and peripheral vascular disease, who presents to the hospital today with a complaint of chest pain and cough.  Clinical Impression  Pt presents with decreased strength and mobility. Will benefit from acute PT services as well as SNF to decrease burden of care and increase functional independence.    PT Assessment  Patient needs continued PT services    Follow Up Recommendations  SNF    Does the patient have the potential to tolerate intense rehabilitation      Barriers to Discharge Decreased caregiver support      Equipment Recommendations  None recommended by PT    Recommendations for Other Services     Frequency Min 2X/week    Precautions / Restrictions Precautions Precautions: Fall Restrictions Weight Bearing Restrictions: No   Pertinent Vitals/Pain No c/o pain.  SpO2 99% on 2L O2      Mobility  Bed Mobility Overal bed mobility: Needs Assistance Bed Mobility: Supine to Sit;Rolling Rolling: Min assist Supine to sit: Mod assist General bed mobility comments: assist for trunk, pt uses railings.  for rolling pt requries cues for sequening, uses rails to turn upper body, needs physical assist at hips for lower body Transfers Overall transfer level: Needs assistance Transfers: Lateral/Scoot Transfers  Lateral/Scoot Transfers: With slide board;Mod assist General transfer comment: cues for set up and physical assist for transfer    Exercises     PT Diagnosis: Generalized weakness  PT Problem List: Decreased strength;Decreased mobility;Decreased safety awareness;Decreased activity tolerance;Decreased balance;Decreased knowledge of use of  DME PT Treatment Interventions: DME instruction;Balance training;Therapeutic exercise;Functional mobility training;Therapeutic activities;Patient/family education;Neuromuscular re-education;Modalities;Wheelchair mobility training     PT Goals(Current goals can be found in the care plan section) Acute Rehab PT Goals Patient Stated Goal: none stated PT Goal Formulation: With family Time For Goal Achievement: 04/05/13 Potential to Achieve Goals: Good  Visit Information  Last PT Received On: 03/29/13 Assistance Needed: +1 History of Present Illness: Logan [name] is a 80 y.o. male with a history of end-stage renal disease, dementia, and peripheral vascular disease, who presents to the hospital today with a complaint of chest pain and cough.       Prior [place] expects to be discharged to::  ([facility]) Additional Comments: wife unable to communicate what level of care pt was recieving at [facility] Prior Function Level of Independence: Needs assistance Gait / Transfers Assistance Needed: uses sliding board with assist, gets assistance to push manual w/c Communication Communication: HOH    Cognition  Cognition Arousal/Alertness: Awake/alert Behavior During Therapy: WFL for tasks assessed/performed Overall Cognitive Status: History of cognitive impairments - at baseline    Extremity/Trunk Assessment Upper Extremity Assessment Upper Extremity Assessment: Generalized weakness Lower Extremity Assessment Lower Extremity Assessment: Generalized weakness Cervical / Trunk Assessment Cervical / Trunk Assessment: Kyphotic   Balance Balance Overall balance assessment: Needs assistance Sitting-balance support: Feet supported;Single extremity supported Sitting balance-Leahy Scale: Fair Dynamic Sitting - Comments: needs close supervision for sitting edge of bed, needs mod A for dynamic seated balance  End of Session PT - End of  Session Equipment Utilized During Treatment: Oxygen Activity Tolerance: Patient tolerated treatment well Patient left: in chair;with family/visitor present;with call bell/phone  within reach Nurse Communication: Mobility status  GP     Eyal Greenhaw 03/29/2013, 11:38 AM

## 2013-03-30 DIAGNOSIS — N186 End stage renal disease: Secondary | ICD-10-CM

## 2013-03-30 LAB — RENAL FUNCTION PANEL
Albumin: 2.6 g/dL — ABNORMAL LOW (ref 3.5–5.2)
BUN: 47 mg/dL — ABNORMAL HIGH (ref 6–23)
CO2: 30 mEq/L (ref 19–32)
Calcium: 9 mg/dL (ref 8.4–10.5)
Chloride: 93 mEq/L — ABNORMAL LOW (ref 96–112)
Creatinine, Ser: 5.08 mg/dL — ABNORMAL HIGH (ref 0.50–1.35)
GFR calc Af Amer: 10 mL/min — ABNORMAL LOW (ref >90)
GFR calc non Af Amer: 9 mL/min — ABNORMAL LOW (ref >90)
Glucose, Bld: 102 mg/dL — ABNORMAL HIGH (ref 70–99)
Phosphorus: 4.4 mg/dL (ref 2.3–4.6)
Potassium: 4 mEq/L (ref 3.7–5.3)
Sodium: 137 mEq/L (ref 137–147)

## 2013-03-30 LAB — CBC
HCT: 33.2 % — ABNORMAL LOW (ref 39.0–52.0)
Hemoglobin: 10.8 g/dL — ABNORMAL LOW (ref 13.0–17.0)
MCH: 32.2 pg (ref 26.0–34.0)
MCHC: 32.5 g/dL (ref 30.0–36.0)
MCV: 99.1 fL (ref 78.0–100.0)
Platelets: 157 10*3/uL (ref 150–400)
RBC: 3.35 MIL/uL — ABNORMAL LOW (ref 4.22–5.81)
RDW: 14.6 % (ref 11.5–15.5)
WBC: 6.8 10*3/uL (ref 4.0–10.5)

## 2013-03-30 MED ORDER — HEPARIN SODIUM (PORCINE) 1000 UNIT/ML DIALYSIS: 1000.0000 [IU] | INTRAMUSCULAR | Status: DC | PRN

## 2013-03-30 MED ORDER — LEVOFLOXACIN IN D5W 500 MG/100ML IV SOLN: 500.0000 mg | INTRAVENOUS | Status: DC

## 2013-03-30 MED ORDER — NEPRO/CARBSTEADY PO LIQD: 237.0000 mL | ORAL | Status: DC | PRN

## 2013-03-30 MED ORDER — SODIUM CHLORIDE 0.9 % IV SOLN: 100.0000 mL | INTRAVENOUS | Status: DC | PRN

## 2013-03-30 MED ORDER — ALTEPLASE 2 MG IJ SOLR
2.0000 mg | Freq: Once | INTRAMUSCULAR | Status: DC | PRN
  Filled 2013-03-30: qty 2

## 2013-03-30 MED ORDER — LIDOCAINE-PRILOCAINE 2.5-2.5 % EX CREA
1.0000 "application " | TOPICAL_CREAM | CUTANEOUS | Status: DC | PRN
  Filled 2013-03-30: qty 5

## 2013-03-30 MED ORDER — PENTAFLUOROPROP-TETRAFLUOROETH EX AERO: 1.0000 "application " | INHALATION_SPRAY | CUTANEOUS | Status: DC | PRN

## 2013-03-30 MED ORDER — LIDOCAINE HCL (PF) 1 % IJ SOLN: 5.0000 mL | INTRAMUSCULAR | Status: DC | PRN

## 2013-03-30 MED ORDER — DOXERCALCIFEROL 4 MCG/2ML IV SOLN
INTRAVENOUS | Status: AC
  Filled 2013-03-30: qty 2

## 2013-03-30 MED ORDER — HEPARIN SODIUM (PORCINE) 1000 UNIT/ML DIALYSIS
20.0000 [IU]/kg | INTRAMUSCULAR | Status: DC | PRN
  Administered 2013-03-30: 1400 [IU] via INTRAVENOUS_CENTRAL

## 2013-03-30 NOTE — Clinical Social Work Placement (Addendum)
Clinical Social Work Department CLINICAL SOCIAL WORK PLACEMENT NOTE 03/30/2013  Patient:  Logan [name], Logan [name]  Account Number:  [id number] Admit date:  03/27/2013  Clinical Social Worker:  Eimy Plaza Givens, LCSW  Date/time:  03/30/2013 03:44 AM  Clinical Social Work is seeking post-discharge placement for this patient at the following level of care:   [place]   (*CSW will update this form in Epic as items are completed)   03/30/2013  Patient/family provided with [place] Department of Clinical Social Work's list of facilities offering this level of care within the geographic area requested by the patient (or if unable, by the patient's family).  03/30/2013  Patient/family informed of their freedom to choose among providers that offer the needed level of care, that participate in Medicare, Medicaid or managed care program needed by the patient, have an available bed and are willing to accept the patient.    Patient/family informed of [facility]' ownership interest in [facility], as well as of the fact that they are under no obligation to receive care at this facility.  PASARR submitted to EDS on  PASARR number received from EDS on - patient has a prior 'A' PASARR  FL2 transmitted to all facilities in geographic area requested by pt/family on  03/30/2013 FL2 transmitted to all facilities within larger geographic area on   Patient informed that his/her managed care company has contracts with or will negotiate with  certain facilities, including the following:     Patient/family informed of bed offers received:   Patient chooses bed at [facility] Physician recommends and patient chooses bed at    Patient to be transferred to [facility] on 04/03/13   Patient to be transferred to facility by ambulance  The following physician request were entered in Epic:   Additional Comments: 03/30/13 - Preference is [facility].

## 2013-03-30 NOTE — Progress Notes (Signed)
[facility] Progress Note    Logan [name] WJX:[id number] DOB: 11/19/1919 DOA: 03/27/2013 PCP: Lucrezia Starch, MD  Admit HPI / Brief Narrative: 80 y.o. male with a history of end-stage renal disease, dementia, and peripheral vascular disease, who presented to the hospital with a complaint of chest pain and cough. The patient was lethargic and unable to provide a reliable history. Over 2 weeks the patient reportedly had a cough which had been nonproductive. He complained of central chest pain during the cough. There had been no associated diaphoresis or radiation of the pain. He had some shortness of breath. He had chills, but no fever. He also had diarrhea numbering one to 2 watery stools daily. He has a history of C. difficile in the past. Of note, he was started on treatment with clindamycin for a mildly infected second toe by his Podiatrist on March 12, 2013. He was given his last dose on March 24, 2013 because his daughter was concerned that the clindamycin was causing the diarrhea.   In the emergency department, he was mildly febrile with temperature 100.8. He was hypertensive with blood pressure in the 213Y to 865H systolically. His lab data were significant for WBC of 16.8, hemoglobin 11.1, potassium of 5.5, BUN of 52, and creatinine is 5.27. His chest x-ray revealed stable cardiomegaly and chronic lung disease. His EKG revealed atrial fibrillation with a heart rate of 64 beats per minute.   HPI/Subjective: Cough is better   Assessment/Plan:  Acute respiratory distress Influenza negative - no focal infiltrate note on CXR - O2 sats stable - appears to have stabilized/resolved for now   ?Chest pain Trop negative x3 in this HD pt - no c/o pain at this time   Sinus Loletha Grayer Stop coreg and aricept - follow on tele - this could have led to resp distress via poor CO   ?Afib Review of EKGs does not reveal convincing evidence of Afib to my eye - follow on tele - at present  bradycardia is the biggest concern   Diarrhea  C diff negative - appears to have slowed/ceased - follow   ESRD on M/W/F HD  Nephrology  Dementia Not agitated - quite pleasant   HTN Follow w/o change today in setting of marked bradycardia  PVD s/p L AKA   Code Status: DNR Family Communication: Spoke with wife  Disposition Plan: from [place], may need SNF- called daughter no answer on cell phone  Consultants: Nephrology  Procedures: none  Antibiotics: Maxipime 1/6- stop Flagyl 1/6- stop Levaquin 1/7- stop after 2 more doses Tamiflu 1/6- stop  DVT prophylaxis: Lovenox  Objective: Blood pressure 151/68, pulse 65, temperature 98.5 F (36.9 C), temperature source Oral, resp. rate 18, height 5' 10.08" (1.78 m), weight 69.5 kg (153 lb 3.5 oz), SpO2 95.00%.  Intake/Output Summary (Last 24 hours) at 03/30/13 1208 Last data filed at 03/30/13 1119  Gross per 24 hour  Intake    480 ml  Output   1500 ml  Net  -1020 ml   Exam: General: No acute respiratory distress at rest Lungs: Very mild bibasilar crackles with no wheeze Cardiovascular: Bradycardic rate but regular rhythm at present Abdomen: Nontender, nondistended, soft, bowel sounds positive, no rebound, no ascites, no appreciable mass Extremities: No significant cyanosis, clubbing, or significant edema   Data Reviewed: Basic Metabolic Panel:  Recent Labs Lab 03/27/13 1450 03/28/13 0210 03/30/13 0708  NA 141 142 137  K 5.5* 5.6* 4.0  CL 95* 97 93*  CO2 29  26 30  GLUCOSE 145* 134* 102*  BUN 52* 66* 47*  CREATININE 5.27* 5.92* 5.08*  CALCIUM 9.0 8.6 9.0  PHOS  --  5.2* 4.4   Liver Function Tests:  Recent Labs Lab 03/27/13 1450 03/28/13 0210 03/30/13 0708  AST 23 26  --   ALT 21 27  --   ALKPHOS 120* 99  --   BILITOT 0.2* <0.2*  --   PROT 6.8 6.0  --   ALBUMIN 3.2* 2.8*  2.8* 2.6*    Recent Labs Lab 03/27/13 1450  LIPASE 79*   CBC:  Recent Labs Lab 03/27/13 1450 03/28/13 0210  03/29/13 0549 03/30/13 0708  WBC 16.8* 13.1* 9.0 6.8  NEUTROABS 13.3*  --   --   --   HGB 11.1* 9.6* 9.3* 10.8*  HCT 34.0* 29.2* 28.2* 33.2*  MCV 100.6* 99.3 97.2 99.1  PLT 202 158 134* 157   Cardiac Enzymes:  Recent Labs Lab 03/27/13 1946 03/28/13 0210 03/28/13 0725  TROPONINI <0.30 <0.30 <0.30   CBG:  Recent Labs Lab 03/27/13 1856  GLUCAP 123*    Recent Results (from the past 240 hour(s))  MRSA PCR SCREENING     Status: None   Collection Time    03/27/13  8:21 PM      Result Value Range Status   MRSA by PCR NEGATIVE  NEGATIVE Final   Comment:            The GeneXpert MRSA Assay (FDA     approved for NASAL specimens     only), is one component of a     comprehensive MRSA colonization     surveillance program. It is not     intended to diagnose MRSA     infection nor to guide or     monitor treatment for     MRSA infections.  CLOSTRIDIUM DIFFICILE BY PCR     Status: None   Collection Time    03/27/13 11:09 PM      Result Value Range Status   C difficile by pcr NEGATIVE  NEGATIVE Final      Scheduled Meds:  Scheduled Meds: . aspirin EC  81 mg Oral QHS  . benzonatate  100 mg Oral TID  . calcium acetate  1,334 mg Oral TID WC  . cinacalcet  30 mg Oral QPM  . darbepoetin (ARANESP) injection - DIALYSIS  60 mcg Intravenous Q Wed-HD  . doxercalciferol      . doxercalciferol  1 mcg Intravenous Q M,W,F-HD  . feeding supplement (NEPRO CARB STEADY)  237 mL Oral BID BM  . isosorbide mononitrate  60 mg Oral Daily  . levofloxacin (LEVAQUIN) IV  750 mg Intravenous Q48H  . multivitamin  1 tablet Oral QHS  . simvastatin  10 mg Oral QHS    Time spent on care of this patient: 35 mins   Ethan Kasperski, [facility] [phone] Office  [phone] Pager - Text Page per Shea Evans as per below:  On-Call/Text Page:      [email or web address]      password [email or web address]  If 7PM-7AM, please contact night-coverage [email or web address] Password [email or web address] 03/30/2013, 12:08 PM   LOS: 3 days

## 2013-03-30 NOTE — Clinical Social Work Psychosocial (Addendum)
Clinical Social Work Department BRIEF PSYCHOSOCIAL ASSESSMENT 03/30/2013  Patient:  Logan [name], Logan [name]     Account Number:  [id number]     [name] date:  03/27/2013  Clinical Social Worker:  Frederico Hamman  Date/Time:  03/30/2013 03:28 AM  Referred by:  Physician  Date Referred:  03/29/2013 Referred for  SNF Placement   Other Referral:   Interview type:  Family Other interview type:   CSW talked with patient's daughter Logan [name] (cell: [phone] and home: [phone]).    PSYCHOSOCIAL DATA Living Status:  WIFE Admitted from facility:  [facility] Level of care:  Independent Living Primary support name:  Logan [name] Primary support relationship to patient:  SPOUSE Degree of support available:   Strong support, wife is with patient at the bedside    CURRENT CONCERNS Current Concerns  Post-Acute Placement   Other Concerns:   Appropriate [facility] of care at [facility] once patient discharges from SNF. Per daughter, they may move to Assisted Living at [facility].    SOCIAL WORK ASSESSMENT / PLAN CSW received call from patient's daughter Logan [name] regarding discharge plans, as she is aware that wife cannot provide the level of care patient needs at discharge and that patient needs rehab before returning home. CSW explained SNF search process and asked if there was a facility preference and daughter responded [facility]. CSW advised that a SNF list will be left in room with wife. Daughter informed CSW that she will call her mother and let her know what the plans are at discharge.   Assessment/plan status:  Psychosocial Support/Ongoing Assessment of Needs Other assessment/ plan:   Information/referral to community resources:   SNF list for [facility]    PATIENT'S/FAMILY'S RESPONSE TO PLAN OF CARE: Logan [name] was proactive and contacted CSW about discharge plans. She is in agreement with [facility] rehab and understands that patient will require more care  than her mother can provide.    Logan Iron, LCSW [phone]

## 2013-03-30 NOTE — Progress Notes (Signed)
[facility] Progress Note  Subjective:   On dialysis this AM  No complaints Says cough is better  Objective Filed Vitals:   03/30/13 0719 03/30/13 0724 03/30/13 0730 03/30/13 0800  BP: 175/62 184/62 149/65 126/63  Pulse: 62 61 62 65  Temp:      TempSrc:      Resp: 16 17 16 17   Height:      Weight:      SpO2: 97% 96%     Physical Exam General: lying in bed  NAD Heart: Regular with sinus brady on tele 56 Lungs:  Anteriorly clear Abdomen: obese soft NT Extremities: left AKA and right LE - no edema Dialysis Access: right IJ perm cath running at BFR 400    Recent Labs Lab 03/27/13 1450 03/28/13 0210 03/30/13 0708  NA 141 142 137  K 5.5* 5.6* 4.0  CL 95* 97 93*  CO2 29 26 30   GLUCOSE 145* 134* 102*  BUN 52* 66* 47*  CREATININE 5.27* 5.92* 5.08*  CALCIUM 9.0 8.6 9.0  PHOS  --  5.2* 4.4   Liver Function Tests:  Recent Labs Lab 03/27/13 1450 03/28/13 0210 03/30/13 0708  AST 23 26  --   ALT 21 27  --   ALKPHOS 120* 99  --   BILITOT 0.2* <0.2*  --   PROT 6.8 6.0  --   ALBUMIN 3.2* 2.8*  2.8* 2.6*    Recent Labs Lab 03/27/13 1450  LIPASE 79*   CBC:  Recent Labs Lab 03/27/13 1450 03/28/13 0210 03/29/13 0549 03/30/13 0708  WBC 16.8* 13.1* 9.0 6.8  NEUTROABS 13.3*  --   --   --   HGB 11.1* 9.6* 9.3* 10.8*  HCT 34.0* 29.2* 28.2* 33.2*  MCV 100.6* 99.3 97.2 99.1  PLT 202 158 134* 157   Blood Culture    Component Value Date/Time   SDES BLOOD LEFT HAND 09/26/2010 1800   SPECREQUEST BOTTLES DRAWN AEROBIC AND ANAEROBIC 10CC 09/26/2010 1800   CULT NO GROWTH 5 DAYS 09/26/2010 1800   REPTSTATUS 10/03/2010 FINAL 09/26/2010 1800    Cardiac Enzymes:  Recent Labs Lab 03/27/13 1946 03/28/13 0210 03/28/13 0725  TROPONINI <0.30 <0.30 <0.30   Medications:   . aspirin EC  81 mg Oral QHS  . benzonatate  100 mg Oral TID  . calcium acetate  1,334 mg Oral TID WC  . cinacalcet  30 mg Oral QPM  . darbepoetin (ARANESP) injection - DIALYSIS  60 mcg  Intravenous Q Wed-HD  . doxercalciferol  1 mcg Intravenous Q M,W,F-HD  . feeding supplement (NEPRO CARB STEADY)  237 mL Oral BID BM  . isosorbide mononitrate  60 mg Oral Daily  . levofloxacin (LEVAQUIN) IV  750 mg Intravenous Q48H  . multivitamin  1 tablet Oral QHS  . simvastatin  10 mg Oral QHS    Dialysis Orders: Center: [facility] on MWF .  EDW 69 kg HD Bath2K/2.25Ca Time 4:00 Heparin 7500. Access R TDC BFR 400 DFR 800  Hectorol 32mcg IV/HD Epogen 3400 Units IV/HD Venofer 0  Recent Labs: Hgb 10.7, Tsat 42%, P 5.3, PTH 287 in Oct  Assessment/Plan:  1. Chest Pain - Resolved. Cough possibly contributing. Questionable new onset AFib on admit but has remained sinus brady with PACs (chronic). Cardiac enzymes negative x 3. Echo 55-60% will mild AS Bradycardic intermittently into the 40's not usual for him; observe on tele (50 - 70s here; coreg d/c 2. Acute bronchitis - Low grade temp given Vanc/cefepime in ED  and one dose levaquin IV 1/07, nebs and tessalon perles; Leukocytosis has now resolved. He is now on levaquin Q48 hours IV.  Could give po.  Influenza panel was negative  3. Diarrhea - C. Diff negative.  4. ESRD - MWF, HD today. K normal (4) pre HD. Pre wt 71 (EDW 69) 5. Hypertension/volume  - HD 6. Anemia - Hgb 9.3<9.6 < 10.7 outpatient. Epo recently decreased. Aranesp 60 for here. Last Tsat 42%. No Fe - not clear why Hgb dropped but back to 10.6 today 7. Metabolic bone disease -  Phoslo 2 ac. PTH within range on Vit D and Sensipar 30. 8. Nutrition - Alb 2.8. High protein renal diet, multivitamin, add ONS 9. Afib - off coreg - HR 60s 10. Dispo - ? Home soon vs ST SNF   Jule Schlabach B, MD

## 2013-03-30 NOTE — Procedures (Signed)
I have personally attended this patient's dialysis session.   TDC 400 UF goal 1.5-2L 2 kg over outpt EDW Hb 10.6, K 4    Logan [name] B

## 2013-03-31 DIAGNOSIS — J189 Pneumonia, unspecified organism: Secondary | ICD-10-CM

## 2013-03-31 MED ORDER — SENNOSIDES-DOCUSATE SODIUM 8.6-50 MG PO TABS
1.0000 | ORAL_TABLET | Freq: Two times a day (BID) | ORAL | Status: DC
  Administered 2013-03-31: 1 via ORAL
  Filled 2013-03-31 (×3): qty 1

## 2013-03-31 MED ORDER — LEVOFLOXACIN 500 MG PO TABS
500.0000 mg | ORAL_TABLET | ORAL | Status: AC
  Administered 2013-04-01: 500 mg via ORAL
  Filled 2013-03-31: qty 1

## 2013-03-31 MED ORDER — CARVEDILOL 3.125 MG PO TABS
3.1250 mg | ORAL_TABLET | Freq: Two times a day (BID) | ORAL | Status: DC
  Administered 2013-03-31 – 2013-04-03 (×5): 3.125 mg via ORAL
  Filled 2013-03-31 (×8): qty 1

## 2013-03-31 MED ORDER — CARVEDILOL 3.125 MG PO TABS
3.1250 mg | ORAL_TABLET | Freq: Two times a day (BID) | ORAL | Status: DC
  Administered 2013-03-31: 3.125 mg via ORAL
  Filled 2013-03-31 (×3): qty 1

## 2013-03-31 NOTE — Progress Notes (Signed)
Pt's heart rate has been elevated (sustaining in the 120's) all morning. When I reviewed telemetry record, it looks like his heart rate converted from NSR to a tachycardic rhythm at approximately 1 am this morning. Pt asymptomatic (no chest pain, or dyspnea). MD notified.

## 2013-03-31 NOTE — Progress Notes (Signed)
[facility] Progress Note    Logan [name]:[id number] DOB: 07-15-1919 DOA: 03/27/2013 PCP: Lucrezia Starch, MD  Admit HPI / Brief Narrative: 80 y.o. male with a history of end-stage renal disease, dementia, and peripheral vascular disease, who presented to the hospital with a complaint of chest pain and cough. The patient was lethargic and unable to provide a reliable history. Over 2 weeks the patient reportedly had a cough which had been nonproductive. He complained of central chest pain during the cough. There had been no associated diaphoresis or radiation of the pain. He had some shortness of breath. He had chills, but no fever. He also had diarrhea numbering one to 2 watery stools daily. He has a history of C. difficile in the past. Of note, he was started on treatment with clindamycin for a mildly infected second toe by his Podiatrist on March 12, 2013. He was given his last dose on March 24, 2013 because his daughter was concerned that the clindamycin was causing the diarrhea.   In the emergency department, he was mildly febrile with temperature 100.8. He was hypertensive with blood pressure in the 884Z to 660Y systolically. His lab data were significant for WBC of 16.8, hemoglobin 11.1, potassium of 5.5, BUN of 52, and creatinine is 5.27. His chest x-ray revealed stable cardiomegaly and chronic lung disease. His EKG revealed atrial fibrillation with a heart rate of 64 beats per minute.   HPI/Subjective: Cough is better HR high throughout the night   Assessment/Plan:  Acute respiratory distress Influenza negative - no focal infiltrate note on CXR - O2 sats stable - appears to have stabilized/resolved for now   ?Chest pain Trop negative x3 in this HD pt - no c/o pain at this time   Sinus Loletha Grayer Now tachy -resume low dose coreg   Diarrhea  Resolved, now constipated -add senna  ESRD on M/W/F HD  Nephrology  Dementia Not agitated - quite pleasant    HTN Resume coreg at lower dose  PVD s/p L AKA   Code Status: DNR Family Communication: Spoke with wife  Disposition Plan: to sNF  Consultants: Nephrology  Procedures: none  Antibiotics: Maxipime 1/6- stop Flagyl 1/6- stop Levaquin 1/7- stop after 2 more doses Tamiflu 1/6- stop  DVT prophylaxis: Lovenox  Objective: Blood pressure 119/69, pulse 78, temperature 97.9 F (36.6 C), temperature source Oral, resp. rate 17, height 5' 10.08" (1.78 m), weight 71.079 kg (156 lb 11.2 oz), SpO2 96.00%.  Intake/Output Summary (Last 24 hours) at 03/31/13 1224 Last data filed at 03/31/13 0900  Gross per 24 hour  Intake    600 ml  Output      0 ml  Net    600 ml   Exam: General: No acute respiratory distress at rest Lungs: Very mild bibasilar crackles with no wheeze Cardiovascular: Bradycardic rate but regular rhythm at present Abdomen: Nontender, nondistended, soft, bowel sounds positive, no rebound, no ascites, no appreciable mass Extremities: No significant cyanosis, clubbing, or significant edema   Data Reviewed: Basic Metabolic Panel:  Recent Labs Lab 03/27/13 1450 03/28/13 0210 03/30/13 0708  NA 141 142 137  K 5.5* 5.6* 4.0  CL 95* 97 93*  CO2 29 26 30   GLUCOSE 145* 134* 102*  BUN 52* 66* 47*  CREATININE 5.27* 5.92* 5.08*  CALCIUM 9.0 8.6 9.0  PHOS  --  5.2* 4.4   Liver Function Tests:  Recent Labs Lab 03/27/13 1450 03/28/13 0210 03/30/13 0708  AST 23 26  --  ALT 21 27  --   ALKPHOS 120* 99  --   BILITOT 0.2* <0.2*  --   PROT 6.8 6.0  --   ALBUMIN 3.2* 2.8*  2.8* 2.6*    Recent Labs Lab 03/27/13 1450  LIPASE 79*   CBC:  Recent Labs Lab 03/27/13 1450 03/28/13 0210 03/29/13 0549 03/30/13 0708  WBC 16.8* 13.1* 9.0 6.8  NEUTROABS 13.3*  --   --   --   HGB 11.1* 9.6* 9.3* 10.8*  HCT 34.0* 29.2* 28.2* 33.2*  MCV 100.6* 99.3 97.2 99.1  PLT 202 158 134* 157   Cardiac Enzymes:  Recent Labs Lab 03/27/13 1946 03/28/13 0210  03/28/13 0725  TROPONINI <0.30 <0.30 <0.30   CBG:  Recent Labs Lab 03/27/13 1856  GLUCAP 123*    Recent Results (from the past 240 hour(s))  MRSA PCR SCREENING     Status: None   Collection Time    03/27/13  8:21 PM      Result Value Range Status   MRSA by PCR NEGATIVE  NEGATIVE Final   Comment:            The GeneXpert MRSA Assay (FDA     approved for NASAL specimens     only), is one component of a     comprehensive MRSA colonization     surveillance program. It is not     intended to diagnose MRSA     infection nor to guide or     monitor treatment for     MRSA infections.  CLOSTRIDIUM DIFFICILE BY PCR     Status: None   Collection Time    03/27/13 11:09 PM      Result Value Range Status   C difficile by pcr NEGATIVE  NEGATIVE Final      Scheduled Meds:  Scheduled Meds: . aspirin EC  81 mg Oral QHS  . benzonatate  100 mg Oral TID  . calcium acetate  1,334 mg Oral TID WC  . cinacalcet  30 mg Oral QPM  . darbepoetin (ARANESP) injection - DIALYSIS  60 mcg Intravenous Q Wed-HD  . doxercalciferol  1 mcg Intravenous Q M,W,F-HD  . feeding supplement (NEPRO CARB STEADY)  237 mL Oral BID BM  . isosorbide mononitrate  60 mg Oral Daily  . [START ON 04/01/2013] levofloxacin (LEVAQUIN) IV  500 mg Intravenous Q48H  . multivitamin  1 tablet Oral QHS  . senna-docusate  1 tablet Oral BID  . simvastatin  10 mg Oral QHS    Time spent on care of this patient: 35 mins   [name], [facility] [phone] Office  [phone] Pager - Text Page per Shea Evans as per below:  On-Call/Text Page:      [email or web address]      password [email or web address]  If 7PM-7AM, please contact night-coverage [email or web address] Password [email or web address] 03/31/2013, 12:24 PM   LOS: 4 days

## 2013-03-31 NOTE — Progress Notes (Signed)
Pt's heart rate converted back to NSR, with HR in the 50's-60's. MD notified.

## 2013-03-31 NOTE — Progress Notes (Signed)
Subjective:  No cos , wife in rm feeding him brk/ hd yesterday he doe not remember  it Objective Vital signs in last 24 hours: Filed Vitals:   03/30/13 1140 03/30/13 1705 03/30/13 2128 03/31/13 0424  BP: 126/60 125/44 141/50 148/69  Pulse: 72 67 71 116  Temp: 97.9 F (36.6 C) 100.3 F (37.9 C) 98.7 F (37.1 C) 97.9 F (36.6 C)  TempSrc: Oral Oral Oral Oral  Resp: 18 20 17 17   Height:      Weight:   71.079 kg (156 lb 11.2 oz)   SpO2: 97% 96% 96% 94%   Weight change: -1.666 kg (-3 lb 10.8 oz)  Physical Exam  General: NAD . Pleasantly confused  Heart: Regular with sinus tachy on tele 120 Lungs:CTA bilat Abdomen: obese soft NT  Extremities: left AKA and right LE - no edema  Dialysis Access: right IJ perm cath  Labs: Basic Metabolic Panel:  Recent Labs Lab 03/27/13 1450 03/28/13 0210 03/30/13 0708  NA 141 142 137  K 5.5* 5.6* 4.0  CL 95* 97 93*  CO2 29 26 30   GLUCOSE 145* 134* 102*  BUN 52* 66* 47*  CREATININE 5.27* 5.92* 5.08*  CALCIUM 9.0 8.6 9.0  PHOS  --  5.2* 4.4   Liver Function Tests:  Recent Labs Lab 03/27/13 1450 03/28/13 0210 03/30/13 0708  AST 23 26  --   ALT 21 27  --   ALKPHOS 120* 99  --   BILITOT 0.2* <0.2*  --   PROT 6.8 6.0  --   ALBUMIN 3.2* 2.8*  2.8* 2.6*    Recent Labs Lab 03/27/13 1450  LIPASE 79*   No results found for this basename: AMMONIA,  in the last 168 hours CBC:  Recent Labs Lab 03/27/13 1450 03/28/13 0210 03/29/13 0549 03/30/13 0708  WBC 16.8* 13.1* 9.0 6.8  NEUTROABS 13.3*  --   --   --   HGB 11.1* 9.6* 9.3* 10.8*  HCT 34.0* 29.2* 28.2* 33.2*  MCV 100.6* 99.3 97.2 99.1  PLT 202 158 134* 157   Cardiac Enzymes:  Recent Labs Lab 03/27/13 1946 03/28/13 0210 03/28/13 0725  TROPONINI <0.30 <0.30 <0.30   CBG:  Recent Labs Lab 03/27/13 1856  GLUCAP 123*    Studies/Results: No results found.  Scheduled Medications:   . aspirin EC  81 mg Oral QHS  . benzonatate  100 mg Oral TID  . calcium  acetate  1,334 mg Oral TID WC  . cinacalcet  30 mg Oral QPM  . darbepoetin (ARANESP) injection - DIALYSIS  60 mcg Intravenous Q Wed-HD  . doxercalciferol  1 mcg Intravenous Q M,W,F-HD  . feeding supplement (NEPRO CARB STEADY)  237 mL Oral BID BM  . isosorbide mononitrate  60 mg Oral Daily  . [START ON 04/01/2013] levofloxacin (LEVAQUIN) IV  500 mg Intravenous Q48H  . multivitamin  1 tablet Oral QHS  . simvastatin  10 mg Oral QHS   Dialysis Orders: [facility]: [facility] on M[facility] .  EDW 69 kg HD Bath2K/2.25Ca Time 4:00 Heparin 7500. Access R TDC BFR 400 DFR 800  Hectorol 21mcg IV/HD Epogen 3400 Units IV/HD Venofer 0  Recent Labs: Hgb 10.7, Tsat 42%, P 5.3, PTH 287 in Oct   Assessment/Plan:   1. Chest Pain - Resolved. Cough possibly contributing. Questionable new onset AFib on admit/bradycardia/ now STon telem asymp. Cardiac enzymes negative x 3. Echo 55-60% with mild AS Bradycardic intermittently into the 40's not usual for him; observe on tele (50 -  70s here; coreg d/cthis admit with brady rate and will restart low dose on prior 3.125 mg bid hold pre hd 2. Acute bronchitis - Low grade temp given Vanc/cefepime in ED and one dose levaquin IV 1/07, nebs and tessalon perles; Leukocytosis has now resolved. He is now on levaquin Q48 hours IV. Could give po. Influenza panel was negative  3. Diarrhea - C. Diff negative.  4. ESRD - MWF, HD today. K normal (4) pre HD. Pre wt 71 (EDW 69) 5. Hypertension/volume - HD this am  bp 148/69 restarting low dose Coreg mainly for ST follow up bps on hd/off hold pre hd 6. Anemia - Hgb 9.3<9.6 < 10.7 outpatient. Epo recently decreased. Aranesp 60 for here. Last Tsat 42%. No Fe - not clear why Hgb10.8 yest pre hd  7. Metabolic bone disease - Phoslo 2 ac. PTH within range on Vit D and Sensipar 30. 8. Nutrition - Alb 2.8>2.6. High protein renal diet, multivitamin, on nepro  Wife feeding "extra corn flakes" 9. Tachy/brady - off coreg - HR 60s yest now tachy coreg restarted - will  have to watch carefully for recurrent symptomatic bradycardia 10. Dispo - ? Home soon vs ST SNF (SNF was recommended).   Ernest Haber, PA-C [facility] Bee[facility] [phone] 03/31/2013,8:26 AM I have seen and examined this patient and agree with plan with highlighted additions.  Coreg restarted - wathc HR carefully.  Levaquin could be switched to po. ST SNF. Kamau Weatherall B,MD 03/31/2013 12:36 PM   LOS: 4 days

## 2013-04-01 LAB — CBC
HEMATOCRIT: 27.7 % — AB (ref 39.0–52.0)
HEMOGLOBIN: 9 g/dL — AB (ref 13.0–17.0)
MCH: 32.5 pg (ref 26.0–34.0)
MCHC: 32.5 g/dL (ref 30.0–36.0)
MCV: 100 fL (ref 78.0–100.0)
Platelets: 219 10*3/uL (ref 150–400)
RBC: 2.77 MIL/uL — ABNORMAL LOW (ref 4.22–5.81)
RDW: 14.8 % (ref 11.5–15.5)
WBC: 9.9 10*3/uL (ref 4.0–10.5)

## 2013-04-01 LAB — BASIC METABOLIC PANEL
BUN: 70 mg/dL — AB (ref 6–23)
CALCIUM: 9.6 mg/dL (ref 8.4–10.5)
CO2: 31 mEq/L (ref 19–32)
Chloride: 91 mEq/L — ABNORMAL LOW (ref 96–112)
Creatinine, Ser: 6.14 mg/dL — ABNORMAL HIGH (ref 0.50–1.35)
GFR calc Af Amer: 8 mL/min — ABNORMAL LOW (ref >90)
GFR calc non Af Amer: 7 mL/min — ABNORMAL LOW (ref >90)
GLUCOSE: 136 mg/dL — AB (ref 70–99)
Potassium: 4.6 mEq/L (ref 3.7–5.3)
Sodium: 134 mEq/L — ABNORMAL LOW (ref 137–147)

## 2013-04-01 LAB — BLOOD GAS, VENOUS
Acid-Base Excess: 5.1 mmol/L — ABNORMAL HIGH (ref 0.0–2.0)
BICARBONATE: 29.8 meq/L — AB (ref 20.0–24.0)
Drawn by: 27022
FIO2: 0.21 %
O2 SAT: 64 %
PATIENT TEMPERATURE: 98.6
PH VEN: 7.393 — AB (ref 7.250–7.300)
PO2 VEN: 36 mmHg (ref 30.0–45.0)
TCO2: 31.3 mmol/L (ref 0–100)
pCO2, Ven: 49.9 mmHg (ref 45.0–50.0)

## 2013-04-01 LAB — GLUCOSE, CAPILLARY: Glucose-Capillary: 148 mg/dL — ABNORMAL HIGH (ref 70–99)

## 2013-04-01 MED ORDER — SENNOSIDES-DOCUSATE SODIUM 8.6-50 MG PO TABS: 1.0000 | ORAL_TABLET | Freq: Every evening | ORAL | Status: DC | PRN

## 2013-04-01 NOTE — Progress Notes (Signed)
Pt more lethargic today than yesterday. Pt will wake to his name, and say "leave me alone." Vitals stable. Pt could just be tired, but I also noticed at breakfast this morning, and with lunch yesterday, that he coughs when drinking liquids. Pt's family says he does this often. Daughter said "he sometimes chokes when he eats/drinks." MD notified. SLP eval ordered.

## 2013-04-01 NOTE — Progress Notes (Signed)
[facility] Progress Note    Logan [name]:[id number] DOB: [birth year] DOA: 03/27/2013 PCP: Lucrezia Starch, MD  Admit HPI / Brief Narrative: 80 y.o. male with a history of end-stage renal disease, dementia, and peripheral vascular disease, who presented to the hospital with a complaint of chest pain and cough. The patient was lethargic and unable to provide a reliable history. Over 2 weeks the patient reportedly had a cough which had been nonproductive. He complained of central chest pain during the cough. There had been no associated diaphoresis or radiation of the pain. He had some shortness of breath. He had chills, but no fever. He also had diarrhea numbering one to 2 watery stools daily. He has a history of C. difficile in the past. Of note, he was started on treatment with clindamycin for a mildly infected second toe by his Podiatrist on March 12, 2013. He was given his last dose on March 24, 2013 because his daughter was concerned that the clindamycin was causing the diarrhea.   In the emergency department, he was mildly febrile with temperature 100.8. He was hypertensive with blood pressure in the 315Q to 008Q systolically. His lab data were significant for WBC of 16.8, hemoglobin 11.1, potassium of 5.5, BUN of 52, and creatinine is 5.27. His chest x-ray revealed stable cardiomegaly and chronic lung disease.    HPI/Subjective: Wife feeding him breakfast, no new c/o this AM   Assessment/Plan:  Acute respiratory distress Influenza negative - no focal infiltrate note on CXR - O2 sats stable - appears to have stabilized/resolved for now   ?Chest pain Trop negative x3 in this HD pt - no c/o pain at this time   Sinus Loletha Grayer Now tachy- appears sinus -resume low dose coreg and monitor on tele  Diarrhea  Resolved, now constipated -added senna PRN  ESRD on M/W/F HD  Nephrology  Dementia  quite pleasant   HTN Resume coreg at lower dose  PVD s/p L AKA   Code  Status: DNR Family Communication: Spoke with wife/daughter Disposition Plan: to sNF  Consultants: Nephrology  Procedures: none  Antibiotics: Maxipime 1/6- stop Flagyl 1/6- stop Levaquin 1/7- stop after 2 more doses Tamiflu 1/6- stop  DVT prophylaxis: Lovenox  Objective: Blood pressure 160/60, pulse 76, temperature 97.7 F (36.5 C), temperature source Oral, resp. rate 16, height 5' 10.08" (1.78 m), weight 70.398 kg (155 lb 3.2 oz), SpO2 94.00%.  Intake/Output Summary (Last 24 hours) at 04/01/13 0858 Last data filed at 03/31/13 1839  Gross per 24 hour  Intake    600 ml  Output      0 ml  Net    600 ml   Exam: General: No acute respiratory distress at rest Lungs: clear anterior with no wheeze Cardiovascular: regular Abdomen: Nontender, nondistended, soft, bowel sounds positive, no rebound, no ascites, no appreciable mass Extremities: No significant cyanosis, clubbing, or significant edema   Data Reviewed: Basic Metabolic Panel:  Recent Labs Lab 03/27/13 1450 03/28/13 0210 03/30/13 0708  NA 141 142 137  K 5.5* 5.6* 4.0  CL 95* 97 93*  CO2 29 26 30   GLUCOSE 145* 134* 102*  BUN 52* 66* 47*  CREATININE 5.27* 5.92* 5.08*  CALCIUM 9.0 8.6 9.0  PHOS  --  5.2* 4.4   Liver Function Tests:  Recent Labs Lab 03/27/13 1450 03/28/13 0210 03/30/13 0708  AST 23 26  --   ALT 21 27  --   ALKPHOS 120* 99  --   BILITOT 0.2* <0.2*  --  PROT 6.8 6.0  --   ALBUMIN 3.2* 2.8*  2.8* 2.6*    Recent Labs Lab 03/27/13 1450  LIPASE 79*   CBC:  Recent Labs Lab 03/27/13 1450 03/28/13 0210 03/29/13 0549 03/30/13 0708  WBC 16.8* 13.1* 9.0 6.8  NEUTROABS 13.3*  --   --   --   HGB 11.1* 9.6* 9.3* 10.8*  HCT 34.0* 29.2* 28.2* 33.2*  MCV 100.6* 99.3 97.2 99.1  PLT 202 158 134* 157   Cardiac Enzymes:  Recent Labs Lab 03/27/13 1946 03/28/13 0210 03/28/13 0725  TROPONINI <0.30 <0.30 <0.30   CBG:  Recent Labs Lab 03/27/13 1856  GLUCAP 123*    Recent  Results (from the past 240 hour(s))  MRSA PCR SCREENING     Status: None   Collection Time    03/27/13  8:21 PM      Result Value Range Status   MRSA by PCR NEGATIVE  NEGATIVE Final   Comment:            The GeneXpert MRSA Assay (FDA     approved for NASAL specimens     only), is one component of a     comprehensive MRSA colonization     surveillance program. It is not     intended to diagnose MRSA     infection nor to guide or     monitor treatment for     MRSA infections.  CLOSTRIDIUM DIFFICILE BY PCR     Status: None   Collection Time    03/27/13 11:09 PM      Result Value Range Status   C difficile by pcr NEGATIVE  NEGATIVE Final      Scheduled Meds:  Scheduled Meds: . aspirin EC  81 mg Oral QHS  . benzonatate  100 mg Oral TID  . calcium acetate  1,334 mg Oral TID WC  . carvedilol  3.125 mg Oral BID WC  . cinacalcet  30 mg Oral QPM  . darbepoetin (ARANESP) injection - DIALYSIS  60 mcg Intravenous Q Wed-HD  . doxercalciferol  1 mcg Intravenous Q M,W,F-HD  . feeding supplement (NEPRO CARB STEADY)  237 mL Oral BID BM  . isosorbide mononitrate  60 mg Oral Daily  . levofloxacin  500 mg Oral Q48H  . multivitamin  1 tablet Oral QHS  . senna-docusate  1 tablet Oral BID  . simvastatin  10 mg Oral QHS    Time spent on care of this patient: 25 mins   Makayah Pauli, [facility] [phone] Office  [phone] Pager - Text Page per Shea Evans as per below:  On-Call/Text Page:      [email or web address]      password [email or web address]  If 7PM-7AM, please contact night-coverage [email or web address] Password [email or web address] 04/01/2013, 8:58 AM   LOS: 5 days

## 2013-04-01 NOTE — Progress Notes (Signed)
Subjective:   No cos / wife in rm bathing Left foot/ "have decided on [facility] " per wife Not quite as alert today.  Observed coughing while eating.  SLP ordered Objective Vital signs in last 24 hours: Filed Vitals:   03/31/13 1336 03/31/13 1723 03/31/13 2055 04/01/13 0520  BP:  101/65 112/42 160/60  Pulse: 64 94 82 76  Temp:  98.1 F (36.7 C) 98.4 F (36.9 C) 97.7 F (36.5 C)  TempSrc:  Oral Oral Oral  Resp:  18 17 16   Height:      Weight:   70.398 kg (155 lb 3.2 oz)   SpO2:  96% 94% 94%   Weight change: -0.602 kg (-1 lb 5.2 oz)  Physical Exam  General: NAD . Pleasant but not quite as perky as yesterday "Oh hey doctor, how are you" Heart: RRR HR 70's Lungs:CTA bilat  Abdomen: obese soft NT  Extremities: left AKA and right LE - no edema  Dialysis Access: right IJ perm cath no tenderness  Labs: Basic Metabolic Panel:  Recent Labs Lab 03/27/13 1450 03/28/13 0210 03/30/13 0708  NA 141 142 137  K 5.5* 5.6* 4.0  CL 95* 97 93*  CO2 29 26 30   GLUCOSE 145* 134* 102*  BUN 52* 66* 47*  CREATININE 5.27* 5.92* 5.08*  CALCIUM 9.0 8.6 9.0  PHOS  --  5.2* 4.4   Liver Function Tests:  Recent Labs Lab 03/27/13 1450 03/28/13 0210 03/30/13 0708  AST 23 26  --   ALT 21 27  --   ALKPHOS 120* 99  --   BILITOT 0.2* <0.2*  --   PROT 6.8 6.0  --   ALBUMIN 3.2* 2.8*  2.8* 2.6*    Recent Labs Lab 03/27/13 1450  LIPASE 79*    Recent Labs Lab 03/27/13 1450 03/28/13 0210 03/29/13 0549 03/30/13 0708  WBC 16.8* 13.1* 9.0 6.8  NEUTROABS 13.3*  --   --   --   HGB 11.1* 9.6* 9.3* 10.8*  HCT 34.0* 29.2* 28.2* 33.2*  MCV 100.6* 99.3 97.2 99.1  PLT 202 158 134* 157    Recent Labs Lab 03/27/13 1946 03/28/13 0210 03/28/13 0725  TROPONINI <0.30 <0.30 <0.30   CBG:  Recent Labs Lab 03/27/13 1856  GLUCAP 123*    Studies/Results: No results found. Medications:   . aspirin EC  81 mg Oral QHS  . benzonatate  100 mg Oral TID  . calcium acetate   1,334 mg Oral TID WC  . carvedilol  3.125 mg Oral BID WC  . cinacalcet  30 mg Oral QPM  . darbepoetin (ARANESP) injection - DIALYSIS  60 mcg Intravenous Q Wed-HD  . doxercalciferol  1 mcg Intravenous Q M,W,F-HD  . feeding supplement (NEPRO CARB STEADY)  237 mL Oral BID BM  . isosorbide mononitrate  60 mg Oral Daily  . levofloxacin  500 mg Oral Q48H  . multivitamin  1 tablet Oral QHS  . simvastatin  10 mg Oral QHS   Dialysis Orders: Center: [facility] on M[facility] .  EDW 69 kg HD Bath2K/2.25Ca Time 4:00 Heparin 7500. Access R TDC BFR 400 DFR 800  Hectorol 69mcg IV/HD Epogen 3400 Units IV/HD Venofer 0  Recent Labs: Hgb 10.7, Tsat 42%, P 5.3, PTH 287 in Oct   Assessment/Plan:  1. Chest Pain - Resolved. Extreme cough with bronchitis  possibly contributed. CE neg/ Questionable new onset AFib on admit; tachy/brady since admit. Back on low dose carvedilol 3 125 bid after tachy  1/10. Echo 55-60% with mild AS  2. Acute bronchitis - Low grade temp given Vanc/cefepime in ED and one dose levaquin IV 1/07, nebs and tessalon perles; Leukocytosis  resolved. He is now on levaquin Q48 hours PO. Influenza panel was negative  3. Diarrhea - C. Diff negative.  4. ESRD - MWF.  5. Hypertension/volume -  restarted low dose Coreg (mainly for ST)  6. Anemia - Hgb 9.3<9.6 < 10.7 outpatient. Epo recently decreased. Aranesp 60 for here. Last Tsat 42%. No Fe   7. Metabolic bone disease - Phoslo 2 ac. PTH within range on Vit D and Sensipar 30. 8. Nutrition - Alb 2.8>2.6. High protein renal diet, multivitamin, on nepro Wife feeding "extra corn flakes" 9. Tachy/brady - back on coreg - HR 69 on exam sr on telem. yest  tachy coreg restarted - will have to watch carefully for recurrent symptomatic bradycardia 10. [facility] 1st [facility]   Ernest Haber, PA-C [facility] [phone] 04/01/2013,9:33 AM  LOS: 5 days  I have seen and examined this patient and agree  with plan with highlighted additions. SLP ordered re ?of aspiration.  For SNF placement.  HD 1st shift Monday in case d/c. Lynell Greenhouse B,MD 04/01/2013 12:20 PM

## 2013-04-01 NOTE — Progress Notes (Signed)
Pt's lethargy persists. Wife is upset because he has slept all day. However, pt's vitals are stable. He does arouse enough to answer questions, but quickly drifts off to sleep. Pt's meal intake has been poor today, due to lethargy, but his blood sugar is stable. MD notified. Orders received.

## 2013-04-02 LAB — RENAL FUNCTION PANEL
ALBUMIN: 2.6 g/dL — AB (ref 3.5–5.2)
BUN: 87 mg/dL — AB (ref 6–23)
CHLORIDE: 93 meq/L — AB (ref 96–112)
CO2: 28 mEq/L (ref 19–32)
CREATININE: 7.38 mg/dL — AB (ref 0.50–1.35)
Calcium: 9.4 mg/dL (ref 8.4–10.5)
GFR calc Af Amer: 7 mL/min — ABNORMAL LOW (ref >90)
GFR calc non Af Amer: 6 mL/min — ABNORMAL LOW (ref >90)
GLUCOSE: 104 mg/dL — AB (ref 70–99)
Phosphorus: 2.9 mg/dL (ref 2.3–4.6)
Potassium: 4.9 mEq/L (ref 3.7–5.3)
Sodium: 137 mEq/L (ref 137–147)

## 2013-04-02 LAB — CBC
HEMATOCRIT: 30.4 % — AB (ref 39.0–52.0)
Hemoglobin: 10.3 g/dL — ABNORMAL LOW (ref 13.0–17.0)
MCH: 32.8 pg (ref 26.0–34.0)
MCHC: 33.9 g/dL (ref 30.0–36.0)
MCV: 96.8 fL (ref 78.0–100.0)
Platelets: 187 10*3/uL (ref 150–400)
RBC: 3.14 MIL/uL — ABNORMAL LOW (ref 4.22–5.81)
RDW: 14.7 % (ref 11.5–15.5)
WBC: 7.6 10*3/uL (ref 4.0–10.5)

## 2013-04-02 MED ORDER — DOXERCALCIFEROL 4 MCG/2ML IV SOLN
INTRAVENOUS | Status: AC
  Administered 2013-04-02: 1 ug via INTRAVENOUS
  Filled 2013-04-02: qty 2

## 2013-04-02 NOTE — Progress Notes (Signed)
SLP Cancellation Note  Patient Details Name: Logan [name] MRN: [id number] DOB: 07-Jun-1919   Cancelled treatment:       Reason Eval/Treat Not Completed: Other (comment). Upon SLP arrival, pt was asleep with wife present at bedside. SLP explained rationale for bedside swallow eval however wife politely declined, stating that her husband was tired from dialysis and that she did not want him to be woken up at this time. She reports that he has not been coughing at meals anymore. SLP encouraged her to let staff know if he continues to cough, and offered to return as schedule allows to attempt swallow eval.  Germain Osgood, M.A. CCC-SLP [phone]   Germain Osgood 04/02/2013, 3:20 PM

## 2013-04-02 NOTE — Procedures (Signed)
I was present at this dialysis session, have reviewed the session itself and made  appropriate changes  Kelly Splinter MD (pgr) [phone]    ([phone] 04/02/2013, 12:15 PM

## 2013-04-02 NOTE — Progress Notes (Signed)
[facility] Progress Note  Subjective:   sleepy  Objective Filed Vitals:   04/02/13 0754 04/02/13 0800 04/02/13 0830 04/02/13 0900  BP: 142/68 132/69 114/45 133/52  Pulse: 65 62 68 63  Temp:      TempSrc:      Resp:      Height:      Weight:      SpO2:       Physical Exam goal 2.5  General:very drowsy Heart: RRR Lungs: grossly clear without wheezes or rales Abdomen: soft NT Extremities: no edema right LE or left AKA Dialysis Access: right perm cath Qb 350  Dialysis Orders: Center: [facility] on MWF .  EDW 69 kg HD Bath2K/2.25Ca Time 4:00 Heparin 7500. Access R TDC BFR 400 DFR 800  Hectorol 40mcg IV/HD Epogen 3400 Units IV/HD Venofer 0  Recent Labs: Hgb 10.7, Tsat 42%, P 5.3, PTH 287 in Oct  Assessment/Plan: 1. Chest Pain - Resolved. Extreme cough with bronchitis possibly contributed. CE neg/ Questionable new onset AFib on admit; tachy/brady since admit. Back on low dose carvedilol 3 125 bid after tachy 1/10. Echo 55-60% with mild AS HR today 60s. Trop neg x 3 2. Acute bronchitis - Low grade temp given Vanc/cefepime in ED and one dose levaquin IV 1/07, nebs and tessalon perles; Leukocytosis resolved. He is now on levaquin Q48 hours PO. Influenza panel was negative  3. Diarrhea - C. Diff negative.  4. ESRD - MWF. K 4.9 tolerating HD with perm cath 5. Hypertension/volume - restarted low dose Coreg (mainly for ST)  6. Anemia - Hgb 9.3<9.6 < 10.7 outpatient. Epo recently decreased. Aranesp 60 for here. Last Tsat 42%. No Fe  7. Metabolic bone disease - Phoslo 2 ac. PTH within range on Vit D and Sensipar 30. 8. Nutrition - Alb 2.8>2.6. High protein renal diet, multivitamin, on nepro Wife feeding "extra corn flakes" 9. Tachy/brady - back on coreg - HR 60s on exam sr on telem.  - will have to watch carefully for recurrent symptomatic bradycardia 10. [facility]   Myriam Jacobson, PA-C [place]  [phone] 04/02/2013,9:31 AM  LOS: 6 days   I have seen and examined patient, discussed with PA and agree with assessment and plan as outlined above. Kelly Splinter MD pager [phone]    cell [phone] 04/02/2013, 12:14 PM    Additional Objective Labs: Basic Metabolic Panel:  Recent Labs Lab 03/28/13 0210 03/30/13 0708 04/01/13 1610 04/02/13 0805  NA 142 137 134* 137  K 5.6* 4.0 4.6 4.9  CL 97 93* 91* 93*  CO2 26 30 31 28   GLUCOSE 134* 102* 136* 104*  BUN 66* 47* 70* 87*  CREATININE 5.92* 5.08* 6.14* 7.38*  CALCIUM 8.6 9.0 9.6 9.4  PHOS 5.2* 4.4  --  2.9   Liver Function Tests:  Recent Labs Lab 03/27/13 1450 03/28/13 0210 03/30/13 0708 04/02/13 0805  AST 23 26  --   --   ALT 21 27  --   --   ALKPHOS 120* 99  --   --   BILITOT 0.2* <0.2*  --   --   PROT 6.8 6.0  --   --   ALBUMIN 3.2* 2.8*  2.8* 2.6* 2.6*    Recent Labs Lab 03/27/13 1450  LIPASE 79*   CBC:  Recent Labs Lab 03/27/13 1450 03/28/13 0210 03/29/13 0549 03/30/13 0708 04/01/13 1610 04/02/13 0805  WBC 16.8* 13.1* 9.0 6.8 9.9  7.6  NEUTROABS 13.3*  --   --   --   --   --   HGB 11.1* 9.6* 9.3* 10.8* 9.0* 10.3*  HCT 34.0* 29.2* 28.2* 33.2* 27.7* 30.4*  MCV 100.6* 99.3 97.2 99.1 100.0 96.8  PLT 202 158 134* 157 219 187  Cardiac Enzymes:  Recent Labs Lab 03/27/13 1946 03/28/13 0210 03/28/13 0725  TROPONINI <0.30 <0.30 <0.30   CBG:  Recent Labs Lab 03/27/13 1856 04/01/13 1525  GLUCAP 123* 148*  Medications:   . aspirin EC  81 mg Oral QHS  . benzonatate  100 mg Oral TID  . calcium acetate  1,334 mg Oral TID WC  . carvedilol  3.125 mg Oral BID WC  . cinacalcet  30 mg Oral QPM  . darbepoetin (ARANESP) injection - DIALYSIS  60 mcg Intravenous Q Wed-HD  . doxercalciferol  1 mcg Intravenous Q M,W,F-HD  . feeding supplement (NEPRO CARB STEADY)  237 mL Oral BID BM  . isosorbide mononitrate  60 mg Oral Daily  . multivitamin  1 tablet Oral QHS  . simvastatin  10 mg Oral QHS

## 2013-04-02 NOTE — Progress Notes (Signed)
PT Cancellation Note  Patient Details Name: Logan [name] MRN: [id number] DOB: 02/08/1920   Cancelled Treatment:    Reason Eval/Treat Not Completed: Fatigue/lethargy limiting ability to participate; wife reports patient back from dialysis and had lunch, then had to be cleaned after BM and wishes to let him rest today.  Explained important to mobilize to keep up his strength and that we will attempt again tomorrow.   Timberlee Roblero,CYNDI 04/02/2013, 4:02 PM

## 2013-04-02 NOTE — Progress Notes (Signed)
[facility] Progress Note    Logan [name] NLG:[id number] DOB: 1919-07-22 DOA: 03/27/2013 PCP: Lucrezia Starch, MD  Admit HPI / Brief Narrative: 80 y.o. male with a history of end-stage renal disease, dementia, and peripheral vascular disease, who presented to the hospital with a complaint of chest pain and cough. The patient was lethargic and unable to provide a reliable history. Over 2 weeks the patient reportedly had a cough which had been nonproductive. He complained of central chest pain during the cough. There had been no associated diaphoresis or radiation of the pain. He had some shortness of breath. He had chills, but no fever. He also had diarrhea numbering one to 2 watery stools daily. He has a history of C. difficile in the past. Of note, he was started on treatment with clindamycin for a mildly infected second toe by his Podiatrist on March 12, 2013. He was given his last dose on March 24, 2013 because his daughter was concerned that the clindamycin was causing the diarrhea.   In the emergency department, he was mildly febrile with temperature 100.8. He was hypertensive with blood pressure in the 081K to 481E systolically. His lab data were significant for WBC of 16.8, hemoglobin 11.1, potassium of 5.5, BUN of 52, and creatinine is 5.27. His chest x-ray revealed stable cardiomegaly and chronic lung disease.    HPI/Subjective: In dialysis   Assessment/Plan:  Acute respiratory distress Influenza negative - no focal infiltrate note on CXR - O2 sats stable - appears to have stabilized/resolved for now   ?Chest pain Trop negative x3 in this HD pt - no c/o pain at this time   Sinus Loletha Grayer Now tachy- appears sinus -resume low dose coreg and monitor on tele  Diarrhea  Resolved, now constipated -added senna PRN  ESRD on M/W/F HD  Nephrology  Dementia  quite pleasant   HTN Resume coreg at lower dose  PVD s/p L AKA   Code Status: DNR Family Communication:  Spoke with wife Disposition Plan: to sNF  Consultants: Nephrology  Procedures: none  Antibiotics: Maxipime 1/6- stop Flagyl 1/6- stop Levaquin 1/7- stop after 2 more doses Tamiflu 1/6- stop  DVT prophylaxis: Lovenox  Objective: Blood pressure 120/70, pulse 65, temperature 98.6 F (37 C), temperature source Oral, resp. rate 18, height 5' 10.08" (1.78 m), weight 69.3 kg (152 lb 12.5 oz), SpO2 94.00%.  Intake/Output Summary (Last 24 hours) at 04/02/13 1437 Last data filed at 04/02/13 1304  Gross per 24 hour  Intake    240 ml  Output   1840 ml  Net  -1600 ml   Exam: General: No acute respiratory distress at rest Lungs: clear anterior with no wheeze Cardiovascular: regular Abdomen: Nontender, nondistended, soft, bowel sounds positive, no rebound, no ascites, no appreciable mass Extremities: No significant cyanosis, clubbing, or significant edema   Data Reviewed: Basic Metabolic Panel:  Recent Labs Lab 03/27/13 1450 03/28/13 0210 03/30/13 0708 04/01/13 1610 04/02/13 0805  NA 141 142 137 134* 137  K 5.5* 5.6* 4.0 4.6 4.9  CL 95* 97 93* 91* 93*  CO2 29 26 30 31 28   GLUCOSE 145* 134* 102* 136* 104*  BUN 52* 66* 47* 70* 87*  CREATININE 5.27* 5.92* 5.08* 6.14* 7.38*  CALCIUM 9.0 8.6 9.0 9.6 9.4  PHOS  --  5.2* 4.4  --  2.9   Liver Function Tests:  Recent Labs Lab 03/27/13 1450 03/28/13 0210 03/30/13 0708 04/02/13 0805  AST 23 26  --   --  ALT 21 27  --   --   ALKPHOS 120* 99  --   --   BILITOT 0.2* <0.2*  --   --   PROT 6.8 6.0  --   --   ALBUMIN 3.2* 2.8*  2.8* 2.6* 2.6*    Recent Labs Lab 03/27/13 1450  LIPASE 79*   CBC:  Recent Labs Lab 03/27/13 1450 03/28/13 0210 03/29/13 0549 03/30/13 0708 04/01/13 1610 04/02/13 0805  WBC 16.8* 13.1* 9.0 6.8 9.9 7.6  NEUTROABS 13.3*  --   --   --   --   --   HGB 11.1* 9.6* 9.3* 10.8* 9.0* 10.3*  HCT 34.0* 29.2* 28.2* 33.2* 27.7* 30.4*  MCV 100.6* 99.3 97.2 99.1 100.0 96.8  PLT 202 158 134* 157  219 187   Cardiac Enzymes:  Recent Labs Lab 03/27/13 1946 03/28/13 0210 03/28/13 0725  TROPONINI <0.30 <0.30 <0.30   CBG:  Recent Labs Lab 03/27/13 1856 04/01/13 1525  GLUCAP 123* 148*    Recent Results (from the past 240 hour(s))  MRSA PCR SCREENING     Status: None   Collection Time    03/27/13  8:21 PM      Result Value Range Status   MRSA by PCR NEGATIVE  NEGATIVE Final   Comment:            The GeneXpert MRSA Assay (FDA     approved for NASAL specimens     only), is one component of a     comprehensive MRSA colonization     surveillance program. It is not     intended to diagnose MRSA     infection nor to guide or     monitor treatment for     MRSA infections.  CLOSTRIDIUM DIFFICILE BY PCR     Status: None   Collection Time    03/27/13 11:09 PM      Result Value Range Status   C difficile by pcr NEGATIVE  NEGATIVE Final      Scheduled Meds:  Scheduled Meds: . aspirin EC  81 mg Oral QHS  . benzonatate  100 mg Oral TID  . calcium acetate  1,334 mg Oral TID WC  . carvedilol  3.125 mg Oral BID WC  . cinacalcet  30 mg Oral QPM  . darbepoetin (ARANESP) injection - DIALYSIS  60 mcg Intravenous Q Wed-HD  . doxercalciferol  1 mcg Intravenous Q M,W,F-HD  . feeding supplement (NEPRO CARB STEADY)  237 mL Oral BID BM  . isosorbide mononitrate  60 mg Oral Daily  . multivitamin  1 tablet Oral QHS  . simvastatin  10 mg Oral QHS    Time spent on care of this patient: 25 mins   VANN, [facility] [phone] Office  [phone] Pager - Text Page per Shea Evans as per below:  On-Call/Text Page:      [email or web address]      password [email or web address]  If 7PM-7AM, please contact night-coverage [email or web address] Password [facility] 04/02/2013, 2:37 PM   LOS: 6 days

## 2013-04-03 ENCOUNTER — Ambulatory Visit: Payer: Self-pay | Admitting: Internal Medicine

## 2013-04-03 MED ORDER — DOXERCALCIFEROL 4 MCG/2ML IV SOLN: 1.0000 ug | INTRAVENOUS | Status: DC

## 2013-04-03 MED ORDER — CARISOPRODOL 350 MG PO TABS: 350.0000 mg | ORAL_TABLET | Freq: Four times a day (QID) | ORAL | Status: DC | PRN

## 2013-04-03 MED ORDER — DARBEPOETIN ALFA-POLYSORBATE 60 MCG/0.3ML IJ SOLN: 60.0000 ug | INTRAMUSCULAR | Status: DC

## 2013-04-03 MED ORDER — NEPRO/CARBSTEADY PO LIQD: 237.0000 mL | Freq: Two times a day (BID) | ORAL | Status: DC

## 2013-04-03 MED ORDER — GUAIFENESIN-DM 100-10 MG/5ML PO SYRP: 5.0000 mL | ORAL_SOLUTION | ORAL | Status: DC | PRN

## 2013-04-03 MED ORDER — ALBUTEROL SULFATE (2.5 MG/3ML) 0.083% IN NEBU: 2.5000 mg | INHALATION_SOLUTION | RESPIRATORY_TRACT | Status: AC | PRN

## 2013-04-03 NOTE — Progress Notes (Signed)
Patient discharged to SNF.  Patient belongings gathered, IV removed.  Social Worker arranged ambulance transport.  Patient, wife, and daughter notified of transfer.  Telemetry removed, CCMT notified, and telemetry box 22 returned.  Report given to SNF nurse.  Patient transferred to SNF via ambulance.

## 2013-04-03 NOTE — Evaluation (Signed)
Clinical/Bedside Swallow Evaluation Patient Details  Name: Logan [name] MRN: [id number] Date of Birth: 02-Jun-1919  Today's Date: 04/03/2013 Time: 1140-1210 SLP Time Calculation (min): 30 min  Past Medical History:  Past Medical History  Diagnosis Date  . Hypertension   . ESRD on dialysis     MONDAY, [place], and FRIDAY  . Stroke 2005  . Anemia   . Secondary hyperparathyroidism   . Peripheral vascular disease, unspecified   . Basal cell carcinoma   . BPH (benign prostatic hypertrophy)   . History of Clostridium difficile infection   . Cataract   . Abnormal nuclear stress test 2005    Mild distal anterior ischemia on nuclear test  . Shortness of breath   . Dementia   . C. difficile colitis    Past Surgical History:  Past Surgical History  Procedure Laterality Date  . Insertion of dialysis catheter  09/27/2010    Left IJ Diatek Catheter  . Above knee leg amputation  12/26/2004    Left AKA by Dr. Oneida Alar  . Tonsillectomy and adenoidectomy    . Thrombectomy / arteriovenous graft revision      Numerous revision and declots  . Femoral bypass  2005    Left Femoral to posterior tibial BPG w/ composite PTFE and vein  . Hernia repair    . Appendectomy    . Cataract extraction      Left eye   . Arteriovenous graft placement     HPI:  80 y.o. male with a history of end-stage renal disease, dementia, and peripheral vascular disease, who presented to the hospital with a complaint of chest pain and cough. The patient was lethargic and unable to provide a reliable history. Over 2 weeks the patient reportedly had a cough which had been nonproductive. He complained of central chest pain during the cough. There had been no associated diaphoresis or radiation of the pain. He had some shortness of breath. He had chills, but no fever. He also had diarrhea numbering one to 2 watery stools daily. He has a history of C. difficile in the past. Of note, he was started on treatment with  clindamycin for a mildly infected second toe by his Podiatrist on March 12, 2013. He was given his last dose on March 24, 2013 because his daughter was concerned that the clindamycin was causing the diarrhea. In the emergency department, he was mildly febrile with temperature 100.8. He was hypertensive with blood pressure in the 703J to 009F systolically. His lab data were significant for WBC of 16.8, hemoglobin 11.1, potassium of 5.5, BUN of 52, and creatinine is 5.27. His chest x-ray revealed stable cardiomegaly and chronic lung disease.    Assessment / Plan / Recommendation Clinical Impression  Pt presents with wet vocal quality and throat clearing following straw sips of thin liquids. No other overt s/s of aspiration were observed, although signs of mild oropharyngeal dysphagia include mild oral holding with thin liquids and suspected delayed swallow initiation with all consistencies tested. Recommend Dys 3 textures and thin liquids with no straws to reduce the risk of aspiration. Note that pt is scheduled to d/c to SNF today, and pt will therefore benefit from f/u at next level of care to monitor for diet tolerance.     Aspiration Risk  Moderate    Diet Recommendation Dysphagia 3 (Mechanical Soft);Thin liquid   Liquid Administration via: Cup;No straw Medication Administration: Whole meds with puree Supervision: Full supervision/cueing for compensatory strategies;Staff to assist with self feeding  Compensations: Slow rate;Small sips/bites Postural Changes and/or Swallow Maneuvers: Seated upright 90 degrees    Other  Recommendations Oral Care Recommendations: Oral care BID   Follow Up Recommendations  Skilled Nursing facility    Frequency and Duration min 2x/week  1 week   Pertinent Vitals/Pain N/A    SLP Swallow Goals  Please refer to Care Plan.   Swallow Study Prior Functional Status       General Date of Onset:  (chronic) HPI: 80 y.o. male with a history of end-stage  renal disease, dementia, and peripheral vascular disease, who presented to the hospital with a complaint of chest pain and cough. The patient was lethargic and unable to provide a reliable history. Over 2 weeks the patient reportedly had a cough which had been nonproductive. He complained of central chest pain during the cough. There had been no associated diaphoresis or radiation of the pain. He had some shortness of breath. He had chills, but no fever. He also had diarrhea numbering one to 2 watery stools daily. He has a history of C. difficile in the past. Of note, he was started on treatment with clindamycin for a mildly infected second toe by his Podiatrist on March 12, 2013. He was given his last dose on March 24, 2013 because his daughter was concerned that the clindamycin was causing the diarrhea. In the emergency department, he was mildly febrile with temperature 100.8. He was hypertensive with blood pressure in the 948N to 462V systolically. His lab data were significant for WBC of 16.8, hemoglobin 11.1, potassium of 5.5, BUN of 52, and creatinine is 5.27. His chest x-ray revealed stable cardiomegaly and chronic lung disease.  Type of Study: Bedside swallow evaluation Previous Swallow Assessment: none in chart Diet Prior to this Study: Regular;Thin liquids Temperature Spikes Noted: Yes (low grade) Respiratory Status: Room air History of Recent Intubation: No Behavior/Cognition: Alert;Cooperative;Pleasant mood;Requires cueing Oral Cavity - Dentition: Adequate natural dentition Self-Feeding Abilities: Able to feed self;Needs assist Patient Positioning: Upright in bed Baseline Vocal Quality: Clear Volitional Cough: Strong Volitional Swallow: Able to elicit    Oral/Motor/Sensory Function Overall Oral Motor/Sensory Function: Impaired Labial ROM: Within Functional Limits Labial Symmetry: Within Functional Limits Lingual ROM: Within Functional Limits Lingual Symmetry: Within Functional  Limits Lingual Strength: Reduced Facial ROM: Within Functional Limits Facial Symmetry: Within Functional Limits Facial Strength: Within Functional Limits Velum: Within Functional Limits Mandible: Within Functional Limits   Ice Chips Ice chips: Not tested   Thin Liquid Thin Liquid: Impaired Presentation: Cup;Self Fed;Straw Oral Phase Functional Implications: Oral holding Pharyngeal  Phase Impairments: Suspected delayed Swallow;Multiple swallows;Throat Clearing - Immediate;Wet Vocal Quality (s/s of aspiration wtih straw sips only)    Nectar Thick Nectar Thick Liquid: Not tested   Honey Thick Honey Thick Liquid: Not tested   Puree Puree: Impaired Presentation: Self Fed;Spoon Pharyngeal Phase Impairments: Suspected delayed Swallow   Solid   GO    Solid: Impaired Presentation: Self Fed Pharyngeal Phase Impairments: Suspected delayed Swallow        Germain Osgood, M.A. CCC-SLP [phone]  Germain Osgood 04/03/2013,12:25 PM

## 2013-04-03 NOTE — Progress Notes (Signed)
PT Cancellation Note  Patient Details Name: ALEXANDE SHEERIN MRN: [id number] DOB: [birth year]   Cancelled Treatment:    Reason Eval/Treat Not Completed: Other (comment), patient awaiting transport to SNF per RN.  Recommend continued PT at SNF.   Tieisha Darden,CYNDI 04/03/2013, 4:31 PM

## 2013-04-03 NOTE — Progress Notes (Signed)
[facility] Progress Note  Subjective:   No c/o; pt daughter and wife with contact from [facility] making arrangements.  Objective Filed Vitals:   04/02/13 1732 04/02/13 2136 04/03/13 0615 04/03/13 0827  BP: 111/62 133/47 154/48 180/70  Pulse: 60 64 59 64  Temp: 98.9 F (37.2 C) 99.5 F (37.5 C) 98.7 F (37.1 C) 99.1 F (37.3 C)  TempSrc: Oral Oral Oral Oral  Resp: 20 20 18 20   Height:      Weight:  72.394 kg (159 lb 9.6 oz)    SpO2: 93% 97% 97% 93%   Physical Exam General: NAD alert and conversant Heart: RRR  Lungs: grossly clear without wheezes or rales  Abdomen: soft NT  Extremities: no edema right LE or left AKA  Dialysis Access: right perm cath Qb 350   Dialysis Orders: Center: [facility] on MWF .  EDW 69 kg HD Bath2K/2.25Ca Time 4:00 Heparin 7500. Access R TDC BFR 400 DFR 800  Hectorol 22mcg IV/HD Epogen 3400 Units IV/HD Venofer 0  Recent Labs: Hgb 10.7, Tsat 42%, P 5.3, PTH 287 in Oct   Assessment/Plan:  1. Chest Pain - Resolved. Extreme cough with bronchitis possibly contributed. CE neg/ Questionable new onset AFib on admit; tachy/brady since admit. Back on low dose carvedilol 3 125 bid after tachy 1/10. Echo 55-60% with mild AS HR today 60s. Trop neg x 3 2. Acute bronchitis - Low grade temp given Vanc/cefepime in ED and one dose levaquin IV 1/07, nebs and tessalon perles; Leukocytosis resolved. He is now on levaquin Q48 hours PO. Influenza panel was negative  3. Diarrhea - C. Diff negative.  4. ESRD - MWF. K 4.9 tolerating HD with perm cath 5. Hypertension/volume - restarted low dose Coreg (mainly for ST)  6. Anemia - Hgb 9 - 10 VARIABLE outpatient. Epo recently decreased. Aranesp 60 for here. Last Tsat 42%. No Fe; Increase epo to 4000 at d/c 7. Metabolic bone disease - Phoslo 2 ac. PTH within range on Vit D and Sensipar 30. - 8. Nutrition - Alb 2.8>2.6. High protein renal diet, multivitamin, on nepro  9. Tachy/brady - back on coreg - HR 60s  . - will have  to watch carefully for recurrent symptomatic bradycardia 10. Dispo - DC to SNF( [facility]) today   Myriam Jacobson, PA-C [place] [phone] 04/03/2013,11:11 AM  LOS: 7 days   I have seen and examined patient, discussed with PA and agree with assessment and plan as outlined above. Kelly Splinter MD pager [phone]    cell [phone] 04/03/2013, 12:26 PM     Additional Objective Labs: Basic Metabolic Panel:  Recent Labs Lab 03/28/13 0210 03/30/13 0708 04/01/13 1610 04/02/13 0805  NA 142 137 134* 137  K 5.6* 4.0 4.6 4.9  CL 97 93* 91* 93*  CO2 26 30 31 28   GLUCOSE 134* 102* 136* 104*  BUN 66* 47* 70* 87*  CREATININE 5.92* 5.08* 6.14* 7.38*  CALCIUM 8.6 9.0 9.6 9.4  PHOS 5.2* 4.4  --  2.9   Liver Function Tests:  Recent Labs Lab 03/27/13 1450 03/28/13 0210 03/30/13 0708 04/02/13 0805  AST 23 26  --   --   ALT 21 27  --   --   ALKPHOS 120* 99  --   --   BILITOT 0.2* <0.2*  --   --   PROT 6.8 6.0  --   --   ALBUMIN 3.2* 2.8*  2.8* 2.6* 2.6*    Recent  Labs Lab 03/27/13 1450  LIPASE 79*   CBC:  Recent Labs Lab 03/27/13 1450 03/28/13 0210 03/29/13 0549 03/30/13 0708 04/01/13 1610 04/02/13 0805  WBC 16.8* 13.1* 9.0 6.8 9.9 7.6  NEUTROABS 13.3*  --   --   --   --   --   HGB 11.1* 9.6* 9.3* 10.8* 9.0* 10.3*  HCT 34.0* 29.2* 28.2* 33.2* 27.7* 30.4*  MCV 100.6* 99.3 97.2 99.1 100.0 96.8  PLT 202 158 134* 157 219 187   Blood Culture    Component Value Date/Time   SDES BLOOD LEFT HAND 09/26/2010 1800   SPECREQUEST BOTTLES DRAWN AEROBIC AND ANAEROBIC 10CC 09/26/2010 1800   CULT NO GROWTH 5 DAYS 09/26/2010 1800   REPTSTATUS 10/03/2010 FINAL 09/26/2010 1800    Cardiac Enzymes:  Recent Labs Lab 03/27/13 1946 03/28/13 0210 03/28/13 0725  TROPONINI <0.30 <0.30 <0.30   CBG:  Recent Labs Lab 03/27/13 1856 04/01/13 1525  GLUCAP 123* 148*   Iron Studies: No results found for this basename: IRON, TIBC, TRANSFERRIN, FERRITIN,   in the last 72 hours @lablastinr3 @ Studies/Results: No results found. Medications:   . aspirin EC  81 mg Oral QHS  . benzonatate  100 mg Oral TID  . calcium acetate  1,334 mg Oral TID WC  . carvedilol  3.125 mg Oral BID WC  . cinacalcet  30 mg Oral QPM  . darbepoetin (ARANESP) injection - DIALYSIS  60 mcg Intravenous Q Wed-HD  . doxercalciferol  1 mcg Intravenous Q M,W,F-HD  . feeding supplement (NEPRO CARB STEADY)  237 mL Oral BID BM  . isosorbide mononitrate  60 mg Oral Daily  . multivitamin  1 tablet Oral QHS  . simvastatin  10 mg Oral QHS

## 2013-04-03 NOTE — Discharge Summary (Addendum)
Physician Discharge Summary  Logan [name] KGU:[id number] DOB: 1919-10-04 DOA: 03/27/2013  PCP: Lucrezia Starch, MD  Admit date: 03/27/2013 Discharge date: 04/03/2013  Time spent: 35 minutes  Recommendations for Outpatient Follow-up:  1. Watch for bradycardia on coreg 2. To SNF 3. HD M/W/F DYS 3 thin- no straws  Discharge Diagnoses:  Principal Problem:   Chest pain Active Problems:   ESRD on dialysis   Vascular dementia   Bradycardia   Hypertension   Acute respiratory failure with hypoxia   Upper respiratory infection   Diarrhea   Hyperkalemia   Anemia in chronic kidney disease   Cough   Encephalopathy, metabolic   Atrial fibrillation   Discharge Condition: improved  Diet recommendation: renal- watch for aspiration  Filed Weights   04/02/13 0723 04/02/13 1155 04/02/13 2136  Weight: 71 kg (156 lb 8.4 oz) 69.3 kg (152 lb 12.5 oz) 72.394 kg (159 lb 9.6 oz)    History of present illness:  Logan [name] is a 80 y.o. male with a history of end-stage renal disease, dementia, and peripheral vascular disease, who presents to the hospital today with a complaint of chest pain and cough. The patient is lethargic and is unable to provide a reliable history. He actually denies chest pain. He only complains of some soreness in his right leg. The history is being provided by his wife and his daughter. Accordingly, over the past 2 weeks, the patient has had a cough which has been nonproductive and productive. She has been coughing more over the past 2 days. His sputum is clear or white. He has complained of central chest pain during the cough and without the cough. There's been no associated diaphoresis or radiation of the pain. He has had some shortness of breath. His wife does not believe he has had pleurisy. He may have had chills, but no fever. He has had a poor appetite. He has also had diarrhea numbering one to 2 watery stools daily for the past 4 or 5 days which concerned his  daughter. He has a history of C. difficile in the past. Of note, he was started on treatment with clindamycin for a mildly infected second toe by his podiatrist on March 12, 2013. He was given his last dose on March 24, 2013 because his daughter was concerned that the clindamycin was causing the diarrhea.  In the emergency department, he was mildly febrile with temperature 100.8. He was mildly bradycardic with a heart rate of 55, but ranging from 55-74. He was hypertensive with blood pressure in the 628B to 151V systolically. His lab data were significant for WBC of 16.8, hemoglobin 11.1, potassium of 5.5, BUN of 52, and creatinine is 5.27. His chest x-ray revealed stable cardiomegaly and chronic lung disease. His EKG reveals atrial fibrillation with a heart rate of 64 beats per minute. He is being admitted for further evaluation and management.      Hospital Course:  Acute respiratory distress  Influenza negative - no focal infiltrate note on CXR - O2 sats stable - appears to have stabilized/resolved for now  Treated with levaquin  ?Chest pain  Trop negative x3 in this HD pt - no c/o pain at this time   Sinus Loletha Grayer  Now tachy- appears sinus  -resume low dose coreg and monitor for bradycardia  Diarrhea  Resolved, now constipated  -added senna PRN   ESRD on M/W/F HD    Dementia  quite pleasant   HTN  Resume coreg at lower dose  PVD s/p L AKA    Procedures:  HD- M/W/F  Consultations:  nephro  Discharge Exam: Filed Vitals:   04/03/13 1216  BP: 126/52  Pulse: 60  Temp: 98.5 F (36.9 C)  Resp: 20    General: A+Ox3, NAD Cardiovascular: rrr Respiratory: clear anterior  Discharge Instructions      Discharge Orders   Future Orders Complete By Expires   Discharge instructions  As directed    Comments:     Renal diet: DYS 3 thin liquid, no straws HD as per nephro Wean O2 as tolerated   Increase activity slowly  As directed        Medication List     STOP taking these medications       albuterol (5 MG/ML) 0.5% nebulizer solution  Commonly known as:  PROVENTIL  Replaced by:  albuterol (2.5 MG/3ML) 0.083% nebulizer solution     donepezil 5 MG tablet  Commonly known as:  ARICEPT      TAKE these medications       albuterol (2.5 MG/3ML) 0.083% nebulizer solution  Commonly known as:  PROVENTIL  Take 3 mLs (2.5 mg total) by nebulization every 2 (two) hours as needed for wheezing or shortness of breath.     aspirin EC 81 MG tablet  Take 81 mg by mouth at bedtime.     calcium acetate 667 MG capsule  Commonly known as:  PHOSLO  Take 1,334 mg by mouth 3 (three) times daily with meals.     carisoprodol 350 MG tablet  Commonly known as:  SOMA  Take 1 tablet (350 mg total) by mouth 4 (four) times daily as needed. For cramps     carvedilol 3.125 MG tablet  Commonly known as:  COREG  Take 3.125 mg by mouth 2 (two) times daily with a meal.     cinacalcet 30 MG tablet  Commonly known as:  SENSIPAR  Take 30 mg by mouth every evening.     darbepoetin 60 MCG/0.3ML Soln injection  Commonly known as:  ARANESP  Inject 0.3 mLs (60 mcg total) into the vein every Wednesday with hemodialysis.     doxercalciferol 4 MCG/2ML injection  Commonly known as:  HECTOROL  Inject 0.5 mLs (1 mcg total) into the vein every Monday, Wednesday, and Friday with hemodialysis.     feeding supplement (NEPRO CARB STEADY) Liqd  Take 237 mLs by mouth 2 (two) times daily between meals.     guaiFENesin-dextromethorphan 100-10 MG/5ML syrup  Commonly known as:  ROBITUSSIN DM  Take 5 mLs by mouth every 4 (four) hours as needed for cough.     isosorbide mononitrate 60 MG 24 hr tablet  Commonly known as:  IMDUR  Take 1 tablet (60 mg total) by mouth daily.     multivitamin Tabs tablet  Take 1 tablet by mouth at bedtime.     simvastatin 10 MG tablet  Commonly known as:  ZOCOR  Take 10 mg by mouth at bedtime.     thiamine 100 MG tablet  Commonly known as:   VITAMIN B-1  Take 100 mg by mouth at bedtime.       No Known Allergies    The results of significant diagnostics from this hospitalization (including imaging, microbiology, ancillary and laboratory) are listed below for reference.    Significant Diagnostic Studies: Dg Chest Portable 1 View  03/27/2013   CLINICAL DATA:  Chest pain and shortness of breath.  EXAM: PORTABLE CHEST - 1 VIEW  COMPARISON:  04/20/2012  FINDINGS: Stable appearance of dialysis catheter and chronic lung disease. Stable chronic pleural thickening in the right hemithorax. No overt edema or airspace consolidation is seen. There is stable cardiomegaly. No significant pleural fluid.  IMPRESSION: Stable cardiomegaly and chronic lung disease.   Electronically Signed   By: Aletta Edouard M.D.   On: 03/27/2013 15:16    Microbiology: Recent Results (from the past 240 hour(s))  MRSA PCR SCREENING     Status: None   Collection Time    03/27/13  8:21 PM      Result Value Range Status   MRSA by PCR NEGATIVE  NEGATIVE Final   Comment:            The GeneXpert MRSA Assay (FDA     approved for NASAL specimens     only), is one component of a     comprehensive MRSA colonization     surveillance program. It is not     intended to diagnose MRSA     infection nor to guide or     monitor treatment for     MRSA infections.  CLOSTRIDIUM DIFFICILE BY PCR     Status: None   Collection Time    03/27/13 11:09 PM      Result Value Range Status   C difficile by pcr NEGATIVE  NEGATIVE Final     Labs: Basic Metabolic Panel:  Recent Labs Lab 03/27/13 1450 03/28/13 0210 03/30/13 0708 04/01/13 1610 04/02/13 0805  NA 141 142 137 134* 137  K 5.5* 5.6* 4.0 4.6 4.9  CL 95* 97 93* 91* 93*  CO2 29 26 30 31 28   GLUCOSE 145* 134* 102* 136* 104*  BUN 52* 66* 47* 70* 87*  CREATININE 5.27* 5.92* 5.08* 6.14* 7.38*  CALCIUM 9.0 8.6 9.0 9.6 9.4  PHOS  --  5.2* 4.4  --  2.9   Liver Function Tests:  Recent Labs Lab 03/27/13 1450  03/28/13 0210 03/30/13 0708 04/02/13 0805  AST 23 26  --   --   ALT 21 27  --   --   ALKPHOS 120* 99  --   --   BILITOT 0.2* <0.2*  --   --   PROT 6.8 6.0  --   --   ALBUMIN 3.2* 2.8*  2.8* 2.6* 2.6*    Recent Labs Lab 03/27/13 1450  LIPASE 79*   No results found for this basename: AMMONIA,  in the last 168 hours CBC:  Recent Labs Lab 03/27/13 1450 03/28/13 0210 03/29/13 0549 03/30/13 0708 04/01/13 1610 04/02/13 0805  WBC 16.8* 13.1* 9.0 6.8 9.9 7.6  NEUTROABS 13.3*  --   --   --   --   --   HGB 11.1* 9.6* 9.3* 10.8* 9.0* 10.3*  HCT 34.0* 29.2* 28.2* 33.2* 27.7* 30.4*  MCV 100.6* 99.3 97.2 99.1 100.0 96.8  PLT 202 158 134* 157 219 187   Cardiac Enzymes:  Recent Labs Lab 03/27/13 1946 03/28/13 0210 03/28/13 0725  TROPONINI <0.30 <0.30 <0.30   BNP: BNP (last 3 results) No results found for this basename: PROBNP,  in the last 8760 hours CBG:  Recent Labs Lab 03/27/13 1856 04/01/13 1525  GLUCAP 123* 148*       Signed:  Ronan Duecker  [facility] 04/03/2013, 12:22 PM

## 2013-04-04 ENCOUNTER — Other Ambulatory Visit: Payer: Self-pay | Admitting: *Deleted

## 2013-04-04 MED ORDER — CARISOPRODOL 350 MG PO TABS: ORAL_TABLET | ORAL | Status: DC

## 2013-04-11 ENCOUNTER — Non-Acute Institutional Stay (SKILLED_NURSING_FACILITY): Payer: Medicare Other | Admitting: Internal Medicine

## 2013-04-11 DIAGNOSIS — F0150 Vascular dementia without behavioral disturbance: Secondary | ICD-10-CM

## 2013-04-11 DIAGNOSIS — I129 Hypertensive chronic kidney disease with stage 1 through stage 4 chronic kidney disease, or unspecified chronic kidney disease: Secondary | ICD-10-CM

## 2013-04-11 DIAGNOSIS — I739 Peripheral vascular disease, unspecified: Secondary | ICD-10-CM

## 2013-04-11 DIAGNOSIS — N189 Chronic kidney disease, unspecified: Secondary | ICD-10-CM

## 2013-04-11 DIAGNOSIS — I4891 Unspecified atrial fibrillation: Secondary | ICD-10-CM

## 2013-04-11 NOTE — Progress Notes (Signed)
HISTORY & PHYSICAL  DATE: 04/11/2013   FACILITY: [facility]  LEVEL OF CARE: SNF (31)  ALLERGIES:  No Known Allergies  CHIEF COMPLAINT:  Manage atrial fibrillation, hypertension and dementia  HISTORY OF PRESENT ILLNESS: Patient is a 80 year old Caucasian male who was hospitalized secondary to acute respiratory distress. After hospitalization he admitted to this facility for short-term rehabilitation.  ATRIAL FIBRILLATION: the patients atrial fibrillation remains stable.  The patient denies DOE, tachycardia, orthopnea, transient neurological sx, pedal edema, palpitations, & PNDs.  No complications noted from the medications currently being used.  DEMENTIA: The dementia remaines stable and continues to function adequately in the current living environment with supervision.  The patient has had little changes in behavior. No complications noted from the medications presently being used.  HTN: Pt 's HTN remains stable.  Denies CP, sob, DOE, pedal edema, headaches, dizziness or visual disturbances.  No complications from the medications currently being used.  Last BP : 126/52  PAST MEDICAL HISTORY :  Past Medical History  Diagnosis Date  . Hypertension   . ESRD on dialysis     MONDAY, [place], and FRIDAY  . Stroke 2005  . Anemia   . Secondary hyperparathyroidism   . Peripheral vascular disease, unspecified   . Basal cell carcinoma   . BPH (benign prostatic hypertrophy)   . History of Clostridium difficile infection   . Cataract   . Abnormal nuclear stress test 2005    Mild distal anterior ischemia on nuclear test  . Shortness of breath   . Dementia   . C. difficile colitis     PAST SURGICAL HISTORY: Past Surgical History  Procedure Laterality Date  . Insertion of dialysis catheter  09/27/2010    Left IJ Diatek Catheter  . Above knee leg amputation  12/26/2004    Left AKA by Dr. Oneida Alar  . Tonsillectomy and adenoidectomy    . Thrombectomy /  arteriovenous graft revision      Numerous revision and declots  . Femoral bypass  2005    Left Femoral to posterior tibial BPG w/ composite PTFE and vein  . Hernia repair    . Appendectomy    . Cataract extraction      Left eye   . Arteriovenous graft placement      SOCIAL HISTORY:  reports that he has never smoked. He has never used smokeless tobacco. He reports that he does not drink alcohol or use illicit drugs.  FAMILY HISTORY:  Family History  Problem Relation Age of Onset  . Heart disease Maternal Uncle   . Heart attack Father     96's   . Heart attack Sister     60's     CURRENT MEDICATIONS: Reviewed per [facility]  REVIEW OF SYSTEMS:  See HPI otherwise 14 point ROS is negative.  PHYSICAL EXAMINATION  VS:  T 98.5       P 60       RR 20      BP 126/52        GENERAL: no acute distress, moderately obese body habitus EYES: conjunctivae normal, sclerae normal, normal eye lids MOUTH/THROAT: lips without lesions,no lesions in the mouth,tongue is without lesions,uvula elevates in midline NECK: supple, trachea midline, no neck masses, no thyroid tenderness, no thyromegaly LYMPHATICS: no LAN in the neck, no supraclavicular LAN RESPIRATORY: breathing is even & unlabored, BS CTAB CARDIAC: Heart rate is irregularly irregular, no murmur,no extra heart sounds, no  edema GI:  ABDOMEN: abdomen soft, normal BS, no masses, no tenderness  LIVER/SPLEEN: no hepatomegaly, no splenomegaly MUSCULOSKELETAL: HEAD: normal to inspection & palpation BACK: no kyphosis, scoliosis or spinal processes tenderness EXTREMITIES: LEFT UPPER EXTREMITY: full range of motion, decreased strength & tone RIGHT UPPER EXTREMITY:  full range of motion, decreased strength & tone LEFT LOWER EXTREMITY:  AKA RIGHT LOWER EXTREMITY:  Moderate range of motion, decreased strength & tone PSYCHIATRIC: the patient is alert & oriented to person, affect & behavior appropriate  LABS/RADIOLOGY:  Labs reviewed: Basic  Metabolic Panel:  Recent Labs  03/28/13 0210 03/30/13 0708 04/01/13 1610 04/02/13 0805  NA 142 137 134* 137  K 5.6* 4.0 4.6 4.9  CL 97 93* 91* 93*  CO2 26 30 31 28   GLUCOSE 134* 102* 136* 104*  BUN 66* 47* 70* 87*  CREATININE 5.92* 5.08* 6.14* 7.38*  CALCIUM 8.6 9.0 9.6 9.4  PHOS 5.2* 4.4  --  2.9   Liver Function Tests:  Recent Labs  11/14/12 1152  03/27/13 1450 03/28/13 0210 03/30/13 0708 04/02/13 0805  AST 14  --  23 26  --   --   ALT 11  --  21 27  --   --   ALKPHOS 85  --  120* 99  --   --   BILITOT 0.2  --  0.2* <0.2*  --   --   PROT 5.7*  --  6.8 6.0  --   --   ALBUMIN  --   < > 3.2* 2.8*  2.8* 2.6* 2.6*  < > = values in this interval not displayed.  Recent Labs  03/27/13 1450  LIPASE 79*   CBC:  Recent Labs  11/14/12 1152 03/27/13 1450  03/30/13 0708 04/01/13 1610 04/02/13 0805  WBC 8.1 16.8*  < > 6.8 9.9 7.6  NEUTROABS 5.5 13.3*  --   --   --   --   HGB 9.9* 11.1*  < > 10.8* 9.0* 10.3*  HCT 29.8* 34.0*  < > 33.2* 27.7* 30.4*  MCV 100* 100.6*  < > 99.1 100.0 96.8  PLT  --  202  < > 157 219 187  < > = values in this interval not displayed.  Lipid Panel:  Recent Labs  11/14/12 1152  HDL 32*   Cardiac Enzymes:  Recent Labs  03/27/13 1946 03/28/13 0210 03/28/13 0725  TROPONINI <0.30 <0.30 <0.30   CBG:  Recent Labs  03/27/13 1856 04/01/13 1525  GLUCAP 123* 148*   PORTABLE CHEST - 1 VIEW   COMPARISON:  04/20/2012   FINDINGS: Stable appearance of dialysis catheter and chronic lung disease. Stable chronic pleural thickening in the right hemithorax. No overt edema or airspace consolidation is seen. There is stable cardiomegaly. No significant pleural fluid.   IMPRESSION: Stable cardiomegaly and chronic lung disease.    ASSESSMENT/PLAN:  Atrial fibrillation-rate controlled Renovascular Hypertension-well-controlled Dementia-stable peripheral vascular disease- status post left AKA End-stage renal disease-on  hemodialysis Anemia of chronic disease-recheck Check CBC  I have reviewed patient's medical records received at admission/from hospitalization.  CPT CODE: 98338

## 2013-04-16 ENCOUNTER — Other Ambulatory Visit: Payer: Self-pay | Admitting: *Deleted

## 2013-04-16 MED ORDER — CARISOPRODOL 350 MG PO TABS: ORAL_TABLET | ORAL | Status: DC

## 2013-04-20 ENCOUNTER — Non-Acute Institutional Stay (SKILLED_NURSING_FACILITY): Payer: Medicare Other | Admitting: Adult Health

## 2013-04-20 DIAGNOSIS — N189 Chronic kidney disease, unspecified: Secondary | ICD-10-CM

## 2013-04-20 DIAGNOSIS — D631 Anemia in chronic kidney disease: Secondary | ICD-10-CM

## 2013-04-20 DIAGNOSIS — N039 Chronic nephritic syndrome with unspecified morphologic changes: Secondary | ICD-10-CM

## 2013-04-20 DIAGNOSIS — I10 Essential (primary) hypertension: Secondary | ICD-10-CM

## 2013-04-20 DIAGNOSIS — I739 Peripheral vascular disease, unspecified: Secondary | ICD-10-CM

## 2013-04-20 DIAGNOSIS — Z992 Dependence on renal dialysis: Secondary | ICD-10-CM

## 2013-04-20 DIAGNOSIS — N186 End stage renal disease: Secondary | ICD-10-CM

## 2013-04-20 DIAGNOSIS — F0150 Vascular dementia without behavioral disturbance: Secondary | ICD-10-CM

## 2013-04-21 NOTE — Progress Notes (Signed)
Patient ID: Logan [name], male   DOB: 08-06-1919, 80 y.o.   MRN: [id number]               PROGRESS NOTE  DATE: 04/20/2013  FACILITY: Nursing Home Location: [facility]  LEVEL OF CARE: SNF (31)  Acute Visit  CHIEF COMPLAINT:  Discharge Notes  HISTORY OF PRESENT ILLNESS: This is a 80 year old male who is for [facility] with Home health PT, OT, Nursing and CNA. He has been admitted to [facility] on 04/03/13 from [facility] with principal discharge diagnosis of chest pain. Troponins were negative X3. He has been admitted for a short-term rehabilitation.  REASSESSMENT OF ONGOING PROBLEM(S):  HTN: Pt 's HTN remains stable.  Denies CP, sob, DOE, pedal edema, headaches, dizziness or visual disturbances.  No complications from the medications currently being used.  Last BP :126/62  PVD: The patient's peripheral vascular disease remains table. The patient denies ongoing claudication. No complications reported from the medication(s) currently being used.  DEMENTIA: The dementia remaines stable and continues to function adequately in the current living environment with supervision.  The patient has had little changes in behavior. No complications noted from the medications presently being used.  PAST MEDICAL HISTORY : Reviewed.  No changes.  CURRENT MEDICATIONS: Reviewed per [facility]  REVIEW OF SYSTEMS:  GENERAL: no change in appetite, no fatigue, no weight changes, no fever, chills or weakness RESPIRATORY: no cough, SOB, DOE, wheezing, hemoptysis CARDIAC: no chest pain, edema or palpitations GI: no abdominal pain, diarrhea, constipation, heart burn, nausea or vomiting  PHYSICAL EXAMINATION  VS:  T97.4       P69      RR18      BP126/62     POX %     WT181.5(Lb)  GENERAL: no acute distress, normal body habitus EYES: conjunctivae normal, sclerae normal, normal eye lids NECK: supple, trachea midline, no neck masses, no thyroid tenderness, no  thyromegaly LYMPHATICS: no LAN in the neck, no supraclavicular LAN RESPIRATORY: breathing is even & unlabored, BS CTAB CARDIAC: RRR, no murmur,no extra heart sounds, no edema GI: abdomen soft, normal BS, no masses, no tenderness, no hepatomegaly, no splenomegaly EXTREMITIES: Left AKA PSYCHIATRIC: the patient is alert & oriented to person, affect & behavior appropriate  LABS/RADIOLOGY:  Labs reviewed:  Basic Metabolic Panel:  Recent Labs   03/28/13 0210  03/30/13 0708  04/01/13 1610  04/02/13 0805   NA  142  137  134*  137   K  5.6*  4.0  4.6  4.9   CL  97  93*  91*  93*   CO2  26  30  31  28    GLUCOSE  134*  102*  136*  104*   BUN  66*  47*  70*  87*   CREATININE  5.92*  5.08*  6.14*  7.38*   CALCIUM  8.6  9.0  9.6  9.4   PHOS  5.2*  4.4  --  2.9   Liver Function Tests:  Recent Labs   11/14/12 1152   03/27/13 1450  03/28/13 0210  03/30/13 0708  04/02/13 0805   AST  14  --  23  26  --  --   ALT  11  --  21  27  --  --   ALKPHOS  85  --  120*  99  --  --   BILITOT  0.2  --  0.2*  <0.2*  --  --  PROT  5.7*  --  6.8  6.0  --  --   ALBUMIN  --  < >  3.2*  2.8*  2.8*  2.6*  2.6*   < > = values in this interval not displayed.  Recent Labs   03/27/13 1450   LIPASE  79*   CBC:  Recent Labs   11/14/12 1152  03/27/13 1450   03/30/13 0708  04/01/13 1610  04/02/13 0805   WBC  8.1  16.8*  < >  6.8  9.9  7.6   NEUTROABS  5.5  13.3*  --  --  --  --   HGB  9.9*  11.1*  < >  10.8*  9.0*  10.3*   HCT  29.8*  34.0*  < >  33.2*  27.7*  30.4*   MCV  100*  100.6*  < >  99.1  100.0  96.8   PLT  --  202  < >  157  219  187   < > = values in this interval not displayed.  Lipid Panel:  Recent Labs   11/14/12 1152   HDL  32*   Cardiac Enzymes:  Recent Labs   03/27/13 1946  03/28/13 0210  03/28/13 0725   TROPONINI  <0.30  <0.30  <0.30   CBG:  Recent Labs   03/27/13 1856  04/01/13 1525   GLUCAP  123*  148*      ASSESSMENT/PLAN:  ESRD - on  hemodialysis  Dementia - stable  Hypertension - well controlled; continue Coreg  PVD - status post left AKA  Anemia of chronic disease - stable   CPT CODE: 24268

## 2013-04-24 DIAGNOSIS — F0150 Vascular dementia without behavioral disturbance: Secondary | ICD-10-CM

## 2013-04-24 DIAGNOSIS — J984 Other disorders of lung: Secondary | ICD-10-CM

## 2013-04-24 DIAGNOSIS — M6281 Muscle weakness (generalized): Secondary | ICD-10-CM

## 2013-04-24 DIAGNOSIS — N039 Chronic nephritic syndrome with unspecified morphologic changes: Secondary | ICD-10-CM

## 2013-04-24 DIAGNOSIS — N186 End stage renal disease: Secondary | ICD-10-CM

## 2013-04-24 DIAGNOSIS — I120 Hypertensive chronic kidney disease with stage 5 chronic kidney disease or end stage renal disease: Secondary | ICD-10-CM

## 2013-04-24 DIAGNOSIS — D631 Anemia in chronic kidney disease: Secondary | ICD-10-CM

## 2013-05-10 ENCOUNTER — Encounter: Payer: Self-pay | Admitting: Nurse Practitioner

## 2013-05-10 ENCOUNTER — Ambulatory Visit (INDEPENDENT_AMBULATORY_CARE_PROVIDER_SITE_OTHER): Payer: Medicare Other | Admitting: Nurse Practitioner

## 2013-05-10 VITALS — BP 136/68 | HR 56 | Temp 98.6°F | Resp 10

## 2013-05-10 DIAGNOSIS — J069 Acute upper respiratory infection, unspecified: Secondary | ICD-10-CM

## 2013-05-10 MED ORDER — LEVOFLOXACIN 500 MG PO TABS: 500.0000 mg | ORAL_TABLET | ORAL | Status: DC

## 2013-05-10 NOTE — Progress Notes (Signed)
Patient ID: Logan [name], male   DOB: 1919/05/15, 80 y.o.   MRN: [id number]    No Known Allergies  Chief Complaint  Patient presents with  . Cough    Patient with ongoing cough  . Medication Management    Patient was on Aricept, discuss if patient needs to restart  . Leg Problem    Examine right leg- bruises     HPI: Patient is a 80 y.o. male who is a pt of Dr Bubba Camp; seen in the office today for cough and mucus; pmh includes dementia, PVD with left AKA, OP, ESRD on HD was in the hospital in January and was found to have bronchitis- went to [facility] for discharge While he was eating he starting coughing.-- has had a swallowing test at [facility], and put him on mech soft and no straws, but he has not been on any precautions at home, eating whatever he wants.  Cough and congestion has been going on for a few days (however daughter really unsure of timeline- could be 2 days could be more like 5)  No fevers or chills no shortness of breath since hospital; Has not taken any medication for cough-- daughter reports mentation is at baseline, sleeps a lot but this is normal Still eating and drinking appropriately  Review of Systems:  Review of Systems  Constitutional: Positive for malaise/fatigue. Negative for fever and chills.  Respiratory: Positive for cough and sputum production. Negative for shortness of breath and wheezing.   Cardiovascular: Negative for chest pain, palpitations and leg swelling.  Gastrointestinal: Negative for diarrhea and constipation.  Genitourinary: Negative for dysuria and urgency.  Musculoskeletal: Negative for myalgias.  Neurological: Positive for weakness.     Past Medical History  Diagnosis Date  . Hypertension   . ESRD on dialysis     MONDAY, [place], and FRIDAY  . Stroke 2005  . Anemia   . Secondary hyperparathyroidism   . Peripheral vascular disease, unspecified   . Basal cell carcinoma   . BPH (benign prostatic hypertrophy)   . History of  Clostridium difficile infection   . Cataract   . Abnormal nuclear stress test 2005    Mild distal anterior ischemia on nuclear test  . Shortness of breath   . Dementia   . C. difficile colitis    Past Surgical History  Procedure Laterality Date  . Insertion of dialysis catheter  09/27/2010    Left IJ Diatek Catheter  . Above knee leg amputation  12/26/2004    Left AKA by Dr. Oneida Alar  . Tonsillectomy and adenoidectomy    . Thrombectomy / arteriovenous graft revision      Numerous revision and declots  . Femoral bypass  2005    Left Femoral to posterior tibial BPG w/ composite PTFE and vein  . Hernia repair    . Appendectomy    . Cataract extraction      Left eye   . Arteriovenous graft placement     Social History:   reports that he has never smoked. He has never used smokeless tobacco. He reports that he does not drink alcohol or use illicit drugs.  Family History  Problem Relation Age of Onset  . Heart disease Maternal Uncle   . Heart attack Father     21's   . Heart attack Sister     46's     Medications: Patient's Medications  New Prescriptions   No medications on file  Previous Medications   ALBUTEROL (PROVENTIL) (  2.5 MG/3ML) 0.083% NEBULIZER SOLUTION    Take 3 mLs (2.5 mg total) by nebulization every 2 (two) hours as needed for wheezing or shortness of breath.   ASPIRIN EC 81 MG TABLET    Take 81 mg by mouth at bedtime.   CALCIUM ACETATE (PHOSLO) 667 MG CAPSULE    Take 1,334 mg by mouth 3 (three) times daily with meals.   CARISOPRODOL (SOMA) 350 MG TABLET    Take one tablet by mouth four times daily as needed for cramps   CARVEDILOL (COREG) 3.125 MG TABLET    Take 3.125 mg by mouth 2 (two) times daily with a meal.    CINACALCET (SENSIPAR) 30 MG TABLET    Take 30 mg by mouth every evening.   DARBEPOETIN (ARANESP) 60 MCG/0.3ML SOLN INJECTION    Inject 0.3 mLs (60 mcg total) into the vein every Wednesday with hemodialysis.   DOCUSATE SODIUM (COLACE) 100 MG CAPSULE     Take 100 mg by mouth 2 (two) times daily.   DOXERCALCIFEROL (HECTOROL) 4 MCG/2ML INJECTION    Inject 0.5 mLs (1 mcg total) into the vein every Monday, Wednesday, and Friday with hemodialysis.   ISOSORBIDE MONONITRATE (IMDUR) 60 MG 24 HR TABLET    Take 1 tablet (60 mg total) by mouth daily.   MULTIVITAMIN (RENA-VIT) TABS TABLET    Take 1 tablet by mouth at bedtime.   NUTRITIONAL SUPPLEMENTS (FEEDING SUPPLEMENT, NEPRO CARB STEADY,) LIQD    Take 237 mLs by mouth 2 (two) times daily between meals.   SENNA (SENOKOT) 8.6 MG TABS TABLET    Take 1 tablet by mouth 2 (two) times daily.   SIMVASTATIN (ZOCOR) 10 MG TABLET    Take 10 mg by mouth at bedtime.   THIAMINE (VITAMIN B-1) 100 MG TABLET    Take 100 mg by mouth at bedtime.  Modified Medications   No medications on file  Discontinued Medications   GUAIFENESIN-DEXTROMETHORPHAN (ROBITUSSIN DM) 100-10 MG/5ML SYRUP    Take 5 mLs by mouth every 4 (four) hours as needed for cough.     Physical Exam:  Filed Vitals:   05/10/13 1442  BP: 136/68  Pulse: 56  Temp: 98.6 F (37 C)  TempSrc: Oral  Resp: 10  SpO2: 95%    Physical Exam  Constitutional:  Frail thin male in NAD  HENT:  Mouth/Throat: Oropharynx is clear and moist. No oropharyngeal exudate.  Neck: Normal range of motion. Neck supple. No thyromegaly present.  Cardiovascular: Normal rate, regular rhythm and normal heart sounds.   Pulmonary/Chest: Effort normal and breath sounds normal. No respiratory distress. He has no wheezes.  Abdominal: Soft. Bowel sounds are normal. He exhibits no distension.  Lymphadenopathy:    He has no cervical adenopathy.     Labs reviewed: Basic Metabolic Panel:  Recent Labs  01/30/13 1534  03/28/13 0210 03/30/13 0708 04/01/13 1610 04/02/13 0805  NA  --   < > 142 137 134* 137  K  --   < > 5.6* 4.0 4.6 4.9  CL  --   < > 97 93* 91* 93*  CO2  --   < > 26 30 31 28   GLUCOSE  --   < > 134* 102* 136* 104*  BUN  --   < > 66* 47* 70* 87*  CREATININE   --   < > 5.92* 5.08* 6.14* 7.38*  CALCIUM  --   < > 8.6 9.0 9.6 9.4  PHOS  --   --  5.2*  4.4  --  2.9  TSH 2.540  --  1.585  --   --   --   < > = values in this interval not displayed. Liver Function Tests:  Recent Labs  11/14/12 1152  03/27/13 1450 03/28/13 0210 03/30/13 0708 04/02/13 0805  AST 14  --  23 26  --   --   ALT 11  --  21 27  --   --   ALKPHOS 85  --  120* 99  --   --   BILITOT 0.2  --  0.2* <0.2*  --   --   PROT 5.7*  --  6.8 6.0  --   --   ALBUMIN  --   < > 3.2* 2.8*  2.8* 2.6* 2.6*  < > = values in this interval not displayed.  Recent Labs  03/27/13 1450  LIPASE 79*   No results found for this basename: AMMONIA,  in the last 8760 hours CBC:  Recent Labs  11/14/12 1152 03/27/13 1450  03/30/13 0708 04/01/13 1610 04/02/13 0805  WBC 8.1 16.8*  < > 6.8 9.9 7.6  NEUTROABS 5.5 13.3*  --   --   --   --   HGB 9.9* 11.1*  < > 10.8* 9.0* 10.3*  HCT 29.8* 34.0*  < > 33.2* 27.7* 30.4*  MCV 100* 100.6*  < > 99.1 100.0 96.8  PLT  --  202  < > 157 219 187  < > = values in this interval not displayed. Lipid Panel:  Recent Labs  11/14/12 1152  HDL 32*  LDLCALC 89  TRIG 168*  CHOLHDL 4.8   TSH:  Recent Labs  01/30/13 1534 03/28/13 0210  TSH 2.540 1.585     Assessment/Plan 1. Upper respiratory infection with cough and congestion -encouraged deep breathing  -diet compliance with recommendations from ST -mucinex DM BID for 2 weeks -Levaquin 500 mg q 48 hours for 1 week -given return precautions and when to seek medication attention -to follow up in 1 week regarding follow up rehab and follow up URI

## 2013-05-10 NOTE — Patient Instructions (Addendum)
mucinex DM 1 tablet twice daily for 2 weeks Will start Levaquin 500 mg every 48 hours for 3 doses   1 week to follow up on hospitalization and bronchitis with Dr Bubba Camp   Bronchitis Bronchitis is inflammation of the airways that extend from the windpipe into the lungs (bronchi). The inflammation often causes mucus to develop, which leads to a cough. If the inflammation becomes severe, it may cause shortness of breath. CAUSES  Bronchitis may be caused by:   Viral infections.   Bacteria.   Cigarette smoke.   Allergens, pollutants, and other irritants.  SIGNS AND SYMPTOMS  The most common symptom of bronchitis is a frequent cough that produces mucus. Other symptoms include:  Fever.   Body aches.   Chest congestion.   Chills.   Shortness of breath.   Sore throat.  DIAGNOSIS  Bronchitis is usually diagnosed through a medical history and physical exam. Tests, such as chest X-rays, are sometimes done to rule out other conditions.  TREATMENT  You may need to avoid contact with whatever caused the problem (smoking, for example). Medicines are sometimes needed. These may include:  Antibiotics. These may be prescribed if the condition is caused by bacteria.  Cough suppressants. These may be prescribed for relief of cough symptoms.   Inhaled medicines. These may be prescribed to help open your airways and make it easier for you to breathe.   Steroid medicines. These may be prescribed for those with recurrent (chronic) bronchitis. HOME CARE INSTRUCTIONS  Get plenty of rest.   Drink enough fluids to keep your urine clear or pale yellow (unless you have a medical condition that requires fluid restriction). Increasing fluids may help thin your secretions and will prevent dehydration.   Only take over-the-counter or prescription medicines as directed by your health care provider.  Only take antibiotics as directed. Make sure you finish them even if you start to feel  better.  Avoid secondhand smoke, irritating chemicals, and strong fumes. These will make bronchitis worse. If you are a smoker, quit smoking. Consider using nicotine gum or skin patches to help control withdrawal symptoms. Quitting smoking will help your lungs heal faster.   Put a cool-mist humidifier in your bedroom at night to moisten the air. This may help loosen mucus. Change the water in the humidifier daily. You can also run the hot water in your shower and sit in the bathroom with the door closed for 5 10 minutes.   Follow up with your health care provider as directed.   Wash your hands frequently to avoid catching bronchitis again or spreading an infection to others.  SEEK MEDICAL CARE IF: Your symptoms do not improve after 1 week of treatment.  SEEK IMMEDIATE MEDICAL CARE IF:  Your fever increases.  You have chills.   You have chest pain.   You have worsening shortness of breath.   You have bloody sputum.  You faint.  You have lightheadedness.  You have a severe headache.   You vomit repeatedly. MAKE SURE YOU:   Understand these instructions.  Will watch your condition.  Will get help right away if you are not doing well or get worse. Document Released: 03/08/2005 Document Revised: 12/27/2012 Document Reviewed: 10/31/2012 [facility] Patient Information 2014 [place].

## 2013-05-13 ENCOUNTER — Emergency Department (HOSPITAL_COMMUNITY): Payer: Medicare Other

## 2013-05-13 ENCOUNTER — Encounter (HOSPITAL_COMMUNITY): Payer: Self-pay | Admitting: Emergency Medicine

## 2013-05-13 ENCOUNTER — Inpatient Hospital Stay (HOSPITAL_COMMUNITY)
Admission: EM | Admit: 2013-05-13 | Discharge: 2013-05-19 | DRG: 064 | Disposition: A | Discharge status: Skilled Nursing Facility | Payer: Medicare Other | Attending: Internal Medicine | Admitting: Internal Medicine

## 2013-05-13 DIAGNOSIS — Z7982 Long term (current) use of aspirin: Secondary | ICD-10-CM

## 2013-05-13 DIAGNOSIS — I120 Hypertensive chronic kidney disease with stage 5 chronic kidney disease or end stage renal disease: Secondary | ICD-10-CM | POA: Diagnosis present

## 2013-05-13 DIAGNOSIS — N186 End stage renal disease: Secondary | ICD-10-CM

## 2013-05-13 DIAGNOSIS — R059 Cough, unspecified: Secondary | ICD-10-CM

## 2013-05-13 DIAGNOSIS — Z79899 Other long term (current) drug therapy: Secondary | ICD-10-CM

## 2013-05-13 DIAGNOSIS — Z7401 Bed confinement status: Secondary | ICD-10-CM

## 2013-05-13 DIAGNOSIS — J189 Pneumonia, unspecified organism: Secondary | ICD-10-CM

## 2013-05-13 DIAGNOSIS — D649 Anemia, unspecified: Secondary | ICD-10-CM | POA: Diagnosis present

## 2013-05-13 DIAGNOSIS — R079 Chest pain, unspecified: Secondary | ICD-10-CM

## 2013-05-13 DIAGNOSIS — D72829 Elevated white blood cell count, unspecified: Secondary | ICD-10-CM

## 2013-05-13 DIAGNOSIS — E860 Dehydration: Secondary | ICD-10-CM | POA: Diagnosis present

## 2013-05-13 DIAGNOSIS — S78119A Complete traumatic amputation at level between unspecified hip and knee, initial encounter: Secondary | ICD-10-CM

## 2013-05-13 DIAGNOSIS — I359 Nonrheumatic aortic valve disorder, unspecified: Secondary | ICD-10-CM | POA: Diagnosis present

## 2013-05-13 DIAGNOSIS — R197 Diarrhea, unspecified: Secondary | ICD-10-CM

## 2013-05-13 DIAGNOSIS — Z992 Dependence on renal dialysis: Secondary | ICD-10-CM

## 2013-05-13 DIAGNOSIS — R0902 Hypoxemia: Secondary | ICD-10-CM

## 2013-05-13 DIAGNOSIS — M810 Age-related osteoporosis without current pathological fracture: Secondary | ICD-10-CM

## 2013-05-13 DIAGNOSIS — I672 Cerebral atherosclerosis: Secondary | ICD-10-CM | POA: Diagnosis present

## 2013-05-13 DIAGNOSIS — Z23 Encounter for immunization: Secondary | ICD-10-CM

## 2013-05-13 DIAGNOSIS — Z66 Do not resuscitate: Secondary | ICD-10-CM | POA: Diagnosis not present

## 2013-05-13 DIAGNOSIS — I739 Peripheral vascular disease, unspecified: Secondary | ICD-10-CM

## 2013-05-13 DIAGNOSIS — R630 Anorexia: Secondary | ICD-10-CM

## 2013-05-13 DIAGNOSIS — N2581 Secondary hyperparathyroidism of renal origin: Secondary | ICD-10-CM | POA: Diagnosis present

## 2013-05-13 DIAGNOSIS — R404 Transient alteration of awareness: Secondary | ICD-10-CM

## 2013-05-13 DIAGNOSIS — N189 Chronic kidney disease, unspecified: Secondary | ICD-10-CM

## 2013-05-13 DIAGNOSIS — J09X1 Influenza due to identified novel influenza A virus with pneumonia: Secondary | ICD-10-CM | POA: Diagnosis present

## 2013-05-13 DIAGNOSIS — G9341 Metabolic encephalopathy: Secondary | ICD-10-CM

## 2013-05-13 DIAGNOSIS — G4733 Obstructive sleep apnea (adult) (pediatric): Secondary | ICD-10-CM | POA: Diagnosis present

## 2013-05-13 DIAGNOSIS — Z8673 Personal history of transient ischemic attack (TIA), and cerebral infarction without residual deficits: Secondary | ICD-10-CM

## 2013-05-13 DIAGNOSIS — I4891 Unspecified atrial fibrillation: Secondary | ICD-10-CM

## 2013-05-13 DIAGNOSIS — J209 Acute bronchitis, unspecified: Secondary | ICD-10-CM

## 2013-05-13 DIAGNOSIS — N039 Chronic nephritic syndrome with unspecified morphologic changes: Secondary | ICD-10-CM

## 2013-05-13 DIAGNOSIS — M899 Disorder of bone, unspecified: Secondary | ICD-10-CM | POA: Diagnosis present

## 2013-05-13 DIAGNOSIS — I10 Essential (primary) hypertension: Secondary | ICD-10-CM

## 2013-05-13 DIAGNOSIS — J9601 Acute respiratory failure with hypoxia: Secondary | ICD-10-CM

## 2013-05-13 DIAGNOSIS — I129 Hypertensive chronic kidney disease with stage 1 through stage 4 chronic kidney disease, or unspecified chronic kidney disease: Secondary | ICD-10-CM

## 2013-05-13 DIAGNOSIS — W19XXXA Unspecified fall, initial encounter: Secondary | ICD-10-CM | POA: Diagnosis not present

## 2013-05-13 DIAGNOSIS — R05 Cough: Secondary | ICD-10-CM

## 2013-05-13 DIAGNOSIS — J101 Influenza due to other identified influenza virus with other respiratory manifestations: Secondary | ICD-10-CM

## 2013-05-13 DIAGNOSIS — Z85828 Personal history of other malignant neoplasm of skin: Secondary | ICD-10-CM

## 2013-05-13 DIAGNOSIS — Z8249 Family history of ischemic heart disease and other diseases of the circulatory system: Secondary | ICD-10-CM

## 2013-05-13 DIAGNOSIS — R001 Bradycardia, unspecified: Secondary | ICD-10-CM

## 2013-05-13 DIAGNOSIS — J069 Acute upper respiratory infection, unspecified: Secondary | ICD-10-CM

## 2013-05-13 DIAGNOSIS — D631 Anemia in chronic kidney disease: Secondary | ICD-10-CM

## 2013-05-13 DIAGNOSIS — Z515 Encounter for palliative care: Secondary | ICD-10-CM

## 2013-05-13 DIAGNOSIS — E875 Hyperkalemia: Secondary | ICD-10-CM

## 2013-05-13 DIAGNOSIS — F29 Unspecified psychosis not due to a substance or known physiological condition: Secondary | ICD-10-CM | POA: Diagnosis present

## 2013-05-13 DIAGNOSIS — F0150 Vascular dementia without behavioral disturbance: Secondary | ICD-10-CM

## 2013-05-13 DIAGNOSIS — M949 Disorder of cartilage, unspecified: Secondary | ICD-10-CM

## 2013-05-13 DIAGNOSIS — N400 Enlarged prostate without lower urinary tract symptoms: Secondary | ICD-10-CM | POA: Diagnosis present

## 2013-05-13 DIAGNOSIS — R5381 Other malaise: Secondary | ICD-10-CM | POA: Diagnosis present

## 2013-05-13 DIAGNOSIS — I6350 Cerebral infarction due to unspecified occlusion or stenosis of unspecified cerebral artery: Principal | ICD-10-CM | POA: Diagnosis present

## 2013-05-13 HISTORY — DX: Sleep apnea, unspecified: G47.30

## 2013-05-13 HISTORY — DX: Personal history of other diseases of the digestive system: Z87.19

## 2013-05-13 LAB — I-STAT VENOUS BLOOD GAS, ED
ACID-BASE EXCESS: 1 mmol/L (ref 0.0–2.0)
BICARBONATE: 27.5 meq/L — AB (ref 20.0–24.0)
O2 Saturation: 59 %
TCO2: 29 mmol/L (ref 0–100)
pCO2, Ven: 54.6 mmHg — ABNORMAL HIGH (ref 45.0–50.0)
pH, Ven: 7.311 — ABNORMAL HIGH (ref 7.250–7.300)
pO2, Ven: 35 mmHg (ref 30.0–45.0)

## 2013-05-13 LAB — CBC WITH DIFFERENTIAL/PLATELET
Basophils Absolute: 0 10*3/uL (ref 0.0–0.1)
Basophils Relative: 0 % (ref 0–1)
Eosinophils Absolute: 0 10*3/uL (ref 0.0–0.7)
Eosinophils Relative: 0 % (ref 0–5)
HCT: 31.3 % — ABNORMAL LOW (ref 39.0–52.0)
HEMOGLOBIN: 10 g/dL — AB (ref 13.0–17.0)
Lymphocytes Relative: 14 % (ref 12–46)
Lymphs Abs: 1.2 10*3/uL (ref 0.7–4.0)
MCH: 32.5 pg (ref 26.0–34.0)
MCHC: 31.9 g/dL (ref 30.0–36.0)
MCV: 101.6 fL — ABNORMAL HIGH (ref 78.0–100.0)
MONO ABS: 1.2 10*3/uL — AB (ref 0.1–1.0)
MONOS PCT: 13 % — AB (ref 3–12)
NEUTROS ABS: 6.7 10*3/uL (ref 1.7–7.7)
Neutrophils Relative %: 73 % (ref 43–77)
Platelets: 163 10*3/uL (ref 150–400)
RBC: 3.08 MIL/uL — ABNORMAL LOW (ref 4.22–5.81)
RDW: 17.6 % — ABNORMAL HIGH (ref 11.5–15.5)
WBC: 9.2 10*3/uL (ref 4.0–10.5)

## 2013-05-13 LAB — EXPECTORATED SPUTUM ASSESSMENT W GRAM STAIN, RFLX TO RESP C

## 2013-05-13 LAB — COMPREHENSIVE METABOLIC PANEL
ALBUMIN: 3 g/dL — AB (ref 3.5–5.2)
ALT: 32 U/L (ref 0–53)
AST: 68 U/L — ABNORMAL HIGH (ref 0–37)
Alkaline Phosphatase: 109 U/L (ref 39–117)
BILIRUBIN TOTAL: 0.3 mg/dL (ref 0.3–1.2)
BUN: 65 mg/dL — AB (ref 6–23)
CALCIUM: 9.2 mg/dL (ref 8.4–10.5)
CHLORIDE: 95 meq/L — AB (ref 96–112)
CO2: 26 mEq/L (ref 19–32)
Creatinine, Ser: 5.93 mg/dL — ABNORMAL HIGH (ref 0.50–1.35)
GFR calc Af Amer: 9 mL/min — ABNORMAL LOW (ref >90)
GFR, EST NON AFRICAN AMERICAN: 7 mL/min — AB (ref >90)
Glucose, Bld: 92 mg/dL (ref 70–99)
Potassium: 5 mEq/L (ref 3.7–5.3)
Sodium: 141 mEq/L (ref 137–147)
Total Protein: 6.3 g/dL (ref 6.0–8.3)

## 2013-05-13 LAB — I-STAT TROPONIN, ED: TROPONIN I, POC: 0.1 ng/mL — AB (ref 0.00–0.08)

## 2013-05-13 LAB — EXPECTORATED SPUTUM ASSESSMENT W REFEX TO RESP CULTURE

## 2013-05-13 LAB — TROPONIN I

## 2013-05-13 LAB — I-STAT CG4 LACTIC ACID, ED: Lactic Acid, Venous: 1.14 mmol/L (ref 0.5–2.2)

## 2013-05-13 MED ORDER — ACETAMINOPHEN 325 MG PO TABS: 650.0000 mg | ORAL_TABLET | Freq: Four times a day (QID) | ORAL | Status: DC | PRN

## 2013-05-13 MED ORDER — SIMVASTATIN 10 MG PO TABS
10.0000 mg | ORAL_TABLET | Freq: Every day | ORAL | Status: DC
  Administered 2013-05-13 – 2013-05-18 (×6): 10 mg via ORAL
  Filled 2013-05-13 (×7): qty 1

## 2013-05-13 MED ORDER — DEXTROSE 5 % IV SOLN
1.0000 g | Freq: Three times a day (TID) | INTRAVENOUS | Status: DC
  Filled 2013-05-13 (×2): qty 1

## 2013-05-13 MED ORDER — ONDANSETRON HCL 4 MG PO TABS: 4.0000 mg | ORAL_TABLET | Freq: Four times a day (QID) | ORAL | Status: DC | PRN

## 2013-05-13 MED ORDER — VITAMIN B-1 100 MG PO TABS
100.0000 mg | ORAL_TABLET | Freq: Every day | ORAL | Status: DC
  Administered 2013-05-13 – 2013-05-17 (×5): 100 mg via ORAL
  Filled 2013-05-13 (×7): qty 1

## 2013-05-13 MED ORDER — ACETAMINOPHEN 650 MG RE SUPP: 650.0000 mg | Freq: Four times a day (QID) | RECTAL | Status: DC | PRN

## 2013-05-13 MED ORDER — ASPIRIN EC 81 MG PO TBEC
81.0000 mg | DELAYED_RELEASE_TABLET | Freq: Every day | ORAL | Status: DC
  Administered 2013-05-13 – 2013-05-16 (×4): 81 mg via ORAL
  Filled 2013-05-13 (×6): qty 1

## 2013-05-13 MED ORDER — HEPARIN SODIUM (PORCINE) 5000 UNIT/ML IJ SOLN
5000.0000 [IU] | Freq: Three times a day (TID) | INTRAMUSCULAR | Status: DC
  Administered 2013-05-13 – 2013-05-19 (×14): 5000 [IU] via SUBCUTANEOUS
  Filled 2013-05-13 (×20): qty 1

## 2013-05-13 MED ORDER — DEXTROSE 5 % IV SOLN
2.0000 g | INTRAVENOUS | Status: DC
  Administered 2013-05-14: 2 g via INTRAVENOUS
  Filled 2013-05-13 (×2): qty 2

## 2013-05-13 MED ORDER — CARVEDILOL 3.125 MG PO TABS
3.1250 mg | ORAL_TABLET | Freq: Two times a day (BID) | ORAL | Status: DC
  Administered 2013-05-14 – 2013-05-19 (×6): 3.125 mg via ORAL
  Filled 2013-05-13 (×13): qty 1

## 2013-05-13 MED ORDER — ISOSORBIDE MONONITRATE ER 60 MG PO TB24
60.0000 mg | ORAL_TABLET | Freq: Every day | ORAL | Status: DC
  Administered 2013-05-13 – 2013-05-19 (×6): 60 mg via ORAL
  Filled 2013-05-13 (×7): qty 1

## 2013-05-13 MED ORDER — ONDANSETRON HCL 4 MG/2ML IJ SOLN: 4.0000 mg | Freq: Four times a day (QID) | INTRAMUSCULAR | Status: DC | PRN

## 2013-05-13 MED ORDER — CINACALCET HCL 30 MG PO TABS
30.0000 mg | ORAL_TABLET | Freq: Every evening | ORAL | Status: DC
  Administered 2013-05-13 – 2013-05-18 (×4): 30 mg via ORAL
  Filled 2013-05-13 (×7): qty 1

## 2013-05-13 MED ORDER — RENA-VITE PO TABS
1.0000 | ORAL_TABLET | Freq: Every day | ORAL | Status: DC
  Administered 2013-05-13 – 2013-05-17 (×5): 1 via ORAL
  Filled 2013-05-13 (×7): qty 1

## 2013-05-13 MED ORDER — SODIUM CHLORIDE 0.9 % IV BOLUS (SEPSIS)
500.0000 mL | Freq: Once | INTRAVENOUS | Status: AC
  Administered 2013-05-13: 500 mL via INTRAVENOUS

## 2013-05-13 MED ORDER — VANCOMYCIN HCL IN DEXTROSE 750-5 MG/150ML-% IV SOLN
750.0000 mg | INTRAVENOUS | Status: DC
  Administered 2013-05-15: 750 mg via INTRAVENOUS
  Filled 2013-05-13 (×2): qty 150

## 2013-05-13 MED ORDER — VANCOMYCIN HCL 10 G IV SOLR
1500.0000 mg | Freq: Once | INTRAVENOUS | Status: AC
  Administered 2013-05-13: 1500 mg via INTRAVENOUS
  Filled 2013-05-13: qty 1500

## 2013-05-13 MED ORDER — DOXERCALCIFEROL 4 MCG/2ML IV SOLN
1.0000 ug | INTRAVENOUS | Status: DC
  Administered 2013-05-14 – 2013-05-18 (×2): 1 ug via INTRAVENOUS
  Filled 2013-05-13 (×3): qty 2

## 2013-05-13 MED ORDER — CARISOPRODOL 350 MG PO TABS: 350.0000 mg | ORAL_TABLET | Freq: Four times a day (QID) | ORAL | Status: DC | PRN

## 2013-05-13 MED ORDER — SODIUM CHLORIDE 0.9 % IV SOLN
INTRAVENOUS | Status: DC
  Administered 2013-05-13: 18:00:00 via INTRAVENOUS

## 2013-05-13 MED ORDER — CALCIUM ACETATE 667 MG PO CAPS
1334.0000 mg | ORAL_CAPSULE | Freq: Three times a day (TID) | ORAL | Status: DC
  Administered 2013-05-14 – 2013-05-19 (×7): 1334 mg via ORAL
  Filled 2013-05-13 (×19): qty 2

## 2013-05-13 MED ORDER — DEXTROSE 5 % IV SOLN
1.0000 g | Freq: Once | INTRAVENOUS | Status: AC
  Administered 2013-05-13: 1 g via INTRAVENOUS
  Filled 2013-05-13: qty 1

## 2013-05-13 NOTE — ED Notes (Signed)
[name] given results of POC VBG. ED-Lab

## 2013-05-13 NOTE — Progress Notes (Signed)
ANTIBIOTIC CONSULT NOTE - INITIAL  Pharmacy Consult for vancomycin Indication: rule out pneumonia  No Known Allergies  Patient Measurements:   Adjusted Body Weight:   Vital Signs: Temp: 98.3 F (36.8 C) (02/22 1700) Temp src: Oral (02/22 1700) BP: 125/28 mmHg (02/22 1700) Pulse Rate: 51 (02/22 1700) Intake/Output from previous day:   Intake/Output from this shift:    Labs:  Recent Labs  05/13/13 0932  WBC 9.2  HGB 10.0*  PLT 163  CREATININE 5.93*   The CrCl is unknown because both a height and weight (above a minimum accepted value) are required for this calculation. No results found for this basename: VANCOTROUGH, VANCOPEAK, VANCORANDOM, GENTTROUGH, GENTPEAK, GENTRANDOM, TOBRATROUGH, TOBRAPEAK, TOBRARND, AMIKACINPEAK, AMIKACINTROU, AMIKACIN,  in the last 72 hours   Microbiology: No results found for this or any previous visit (from the past 720 hour(s)).  Medical History: Past Medical History  Diagnosis Date  . Hypertension   . ESRD on dialysis     MONDAY, [place], and FRIDAY  . Stroke 2005  . Anemia   . Secondary hyperparathyroidism   . Peripheral vascular disease, unspecified   . Basal cell carcinoma   . BPH (benign prostatic hypertrophy)   . History of Clostridium difficile infection   . Cataract   . Abnormal nuclear stress test 2005    Mild distal anterior ischemia on nuclear test  . Shortness of breath   . Dementia   . C. difficile colitis     Medications:  Prescriptions prior to admission  Medication Sig Dispense Refill  . albuterol (PROVENTIL) (2.5 MG/3ML) 0.083% nebulizer solution Take 3 mLs (2.5 mg total) by nebulization every 2 (two) hours as needed for wheezing or shortness of breath.  75 mL  12  . aspirin EC 81 MG tablet Take 81 mg by mouth at bedtime.      . calcium acetate (PHOSLO) 667 MG capsule Take 1,334 mg by mouth 3 (three) times daily with meals.      . carisoprodol (SOMA) 350 MG tablet Take 350 mg by mouth 4 (four) times daily  as needed (for cramps).      . carvedilol (COREG) 3.125 MG tablet Take 3.125 mg by mouth 2 (two) times daily with a meal.       . cinacalcet (SENSIPAR) 30 MG tablet Take 30 mg by mouth every evening.      . darbepoetin (ARANESP) 60 MCG/0.3ML SOLN injection Inject 0.3 mLs (60 mcg total) into the vein every Wednesday with hemodialysis.  4.2 mL    . dextromethorphan-guaiFENesin (MUCINEX DM) 30-600 MG per 12 hr tablet Take 1 tablet by mouth 2 (two) times daily.      Marland Kitchen docusate sodium (COLACE) 100 MG capsule Take 100 mg by mouth 2 (two) times daily.      Marland Kitchen doxercalciferol (HECTOROL) 4 MCG/2ML injection Inject 1 mcg into the vein every Monday, Wednesday, and Friday with hemodialysis.      Marland Kitchen isosorbide mononitrate (IMDUR) 60 MG 24 hr tablet Take 1 tablet (60 mg total) by mouth daily.  30 tablet  0  . levofloxacin (LEVAQUIN) 500 MG tablet Take 1 tablet (500 mg total) by mouth every other day.  3 tablet  0  . loperamide (IMODIUM) 2 MG capsule Take 2-4 mg by mouth daily as needed for diarrhea or loose stools.      . multivitamin (RENA-VIT) TABS tablet Take 1 tablet by mouth at bedtime.      . Nutritional Supplements (FEEDING SUPPLEMENT, NEPRO CARB STEADY,) LIQD  Take 237 mLs by mouth 2 (two) times daily between meals.    0  . senna (SENOKOT) 8.6 MG TABS tablet Take 1 tablet by mouth 2 (two) times daily.      . simvastatin (ZOCOR) 10 MG tablet Take 10 mg by mouth at bedtime.      . thiamine (VITAMIN B-1) 100 MG tablet Take 100 mg by mouth at bedtime.       Scheduled:  . aspirin EC  81 mg Oral QHS  . [START ON 05/14/2013] calcium acetate  1,334 mg Oral TID WC  . [START ON 05/14/2013] carvedilol  3.125 mg Oral BID WC  . ceFEPime (MAXIPIME) IV  1 g Intravenous Q8H  . cinacalcet  30 mg Oral QPM  . [START ON 05/14/2013] doxercalciferol  1 mcg Intravenous Q M,W,F-HD  . heparin  5,000 Units Subcutaneous 3 times per day  . isosorbide mononitrate  60 mg Oral Daily  . multivitamin  1 tablet Oral QHS  . simvastatin   10 mg Oral QHS  . thiamine  100 mg Oral QHS   Assessment: 80 yo with ESRD who is here for prob PNA. Vanc and cefepime have been ordered empirically.   Goal of Therapy:  Pre-HD vanc = 55-25  Plan:   Vanc 1.5g IV x1 then 750mg  IV qHD Cefepime 2g IV qHD Will get vanc trough if needed

## 2013-05-13 NOTE — Progress Notes (Addendum)
Clinical Social Work Department BRIEF PSYCHOSOCIAL ASSESSMENT 05/13/2013  Patient:  Logan [name], Logan [name]     Account Number:  [id number]     Admit date:  05/13/2013  Clinical Social Worker:  Su Monks  Date/Time:  05/13/2013 04:15 PM  Referred by:  Physician  Date Referred:  05/13/2013 Referred for  Psychosocial assessment  SNF Placement   Other Referral:   Referred due to not managing well in independent living with Wife   Interview type:  Family Other interview type:   Weekend CSW spoke with wife Logan [name] and daughter Logan [name] who were present at the bedside in the ED    PSYCHOSOCIAL DATA Living Status:  FACILITY Admitted from facility:  [facility] Level of care:  Independent Living Primary support name:  Logan [name] Primary support relationship to patient:  SPOUSE Degree of support available:   Strong but it appears it is becoming increasingly difficult for wife Logan [name] to care for patient at home.    CURRENT CONCERNS Current Concerns  Post-Acute Placement   Other Concerns:    SOCIAL WORK ASSESSMENT / PLAN CSW and CM met with patient, his wife Logan [name], and his daughter Logan [name] who were present at the bedside in the Emergency Department. Patient appeared lethargic and fell asleep during conversation. CSW and CM spoke with wife and daughter about how patient is managing at home and his health recently. Patient and wife live in independent living at [facility] and wife provides patient care with the assistance of a health aide several days a week. Daughter shared concern that her mother's health has been declining as a result of continuous care taking. Daughter states that patient has had increasing lethargy and weakness for several days. They are hoping that patient will be admitted for the night and can receive dialysis tomorrow to see if patient becomes more alert and awake before returning home. Daughter and wife report that current home care aide services can be  increased if needed. Daughter states that she feels that assisted living could be beneficial for patient, but it appears that wife does not want to explore that avenue at this time. CSW emphasized the importance of planning ahead for the future and applying for long-term care insurance when patient is in need of skilled care. Daughter states that she understands. Wife and daughter have no questions at this time. CSW and CM updated MD.          Update: CSW took patient's family information on [place] resources and benefit information. Daughter reports that she thinks patient is going to be admitted and that her and her mother have come to a consensus that skilled care would be beneficial for patient at discharge. They state a preference for [facility] where patient has received rehab in the past. CSW explained SNF referral process and will provide family with SNF listing.   Assessment/plan status:  Information/Referral to [employer] Other assessment/ plan:   Weekday CSW to follow for discharge to SNF if appropriate.   Information/referral to community resources:   Weekend CSW provided patient's family with local SNF list.    PATIENT'S/FAMILY'S RESPONSE TO PLAN OF CARE: Patient asleep during majority of assessment. Patient's daughter and wife have come to the decision that skilled care may be beneficial to patient, as it is becoming increasingly difficult for wife to care for husband independently.    Logan [name], MSW, [place] Clinical Social Worker [facility] Emergency Dept. [phone]

## 2013-05-13 NOTE — ED Notes (Signed)
Logan [name] given results of istat troponin POC. ED-Lab.

## 2013-05-13 NOTE — ED Notes (Signed)
Alecia Lemming PA given results of Istat Lactic Acid ED-Lab

## 2013-05-13 NOTE — Progress Notes (Signed)
Logan [name],wife and Daughter [name] at bedside.[name] resides at [place] living with his wife.Patients Daughter lives locally and visits and helps  with  Patients Medication.Patients wife is the main care taker -currently [name] [facility].Wife reports husband is more lethargic today and has some Increased cough.CM will liaise with patients MD.

## 2013-05-13 NOTE — H&P (Signed)
[facility] History and Physical  Logan [name] [id number] DOB: July 05, 1919 DOA: 05/13/2013  Referring physician: Dr. Rondel Oh PCP: Blanchie Serve, MD   Chief Complaint: confusion  HPI: Logan [name] is a 80 y.o. male  End-stage renal disease Monday Wednesday and Friday, hypertension history of stroke comes in for decreased level of consciousness this started 4 days prior to admission progressively getting worse with decreased oral intake. He then saw his primary care doctor 2 days prior to admission was started on Levaquin every other day. As per family he's been also having diarrhea. On the day of admission he could not get out of bed was barely arousable.   In the ED: Vitals were stable metabolic panel was done that was within normal except for his creatinine, first set of cardiac enzymes negative CBC was done with a mild anemia. That as below.  Review of Systems:  Constitutional:  No weight loss, night sweats, Fevers, chills, fatigue.  HEENT:  No headaches, Difficulty swallowing,Tooth/dental problems,Sore throat,  No sneezing, itching, ear ache, nasal congestion, post nasal drip,  Cardio-vascular:  No chest pain, Orthopnea, PND, swelling in lower extremities, anasarca, dizziness, palpitations  GI:  No heartburn, indigestion, abdominal pain, nausea, vomiting, diarrhea, change in bowel habits, loss of appetite  Resp:  No shortness of breath with exertion or at rest. No excess mucus, no productive cough, No non-productive cough, No coughing up of blood.No change in color of mucus.No wheezing.No chest wall deformity  Skin:  no rash or lesions.  GU:  no dysuria, change in color of urine, no urgency or frequency. No flank pain.  Musculoskeletal:  No joint pain or swelling. No decreased range of motion. No back pain.  Psych:  No change in mood or affect. No depression or anxiety. No memory loss.   Past Medical History  Diagnosis Date  . Hypertension   . ESRD  on dialysis     MONDAY, [place], and FRIDAY  . Stroke 2005  . Anemia   . Secondary hyperparathyroidism   . Peripheral vascular disease, unspecified   . Basal cell carcinoma   . BPH (benign prostatic hypertrophy)   . History of Clostridium difficile infection   . Cataract   . Abnormal nuclear stress test 2005    Mild distal anterior ischemia on nuclear test  . Shortness of breath   . Dementia   . C. difficile colitis    Past Surgical History  Procedure Laterality Date  . Insertion of dialysis catheter  09/27/2010    Left IJ Diatek Catheter  . Above knee leg amputation  12/26/2004    Left AKA by Dr. Oneida Alar  . Tonsillectomy and adenoidectomy    . Thrombectomy / arteriovenous graft revision      Numerous revision and declots  . Femoral bypass  2005    Left Femoral to posterior tibial BPG w/ composite PTFE and vein  . Hernia repair    . Appendectomy    . Cataract extraction      Left eye   . Arteriovenous graft placement     Social History:  reports that he has never smoked. He has never used smokeless tobacco. He reports that he does not drink alcohol or use illicit drugs.  No Known Allergies  Family History  Problem Relation Age of Onset  . Heart disease Maternal Uncle   . Heart attack Father     73's   . Heart attack Sister     11's  Prior to Admission medications   Medication Sig Start Date End Date Taking? Authorizing Provider  albuterol (PROVENTIL) (2.5 MG/3ML) 0.083% nebulizer solution Take 3 mLs (2.5 mg total) by nebulization every 2 (two) hours as needed for wheezing or shortness of breath. 04/03/13  Yes Geradine Girt, DO  aspirin EC 81 MG tablet Take 81 mg by mouth at bedtime.   Yes Historical Provider, MD  calcium acetate (PHOSLO) 667 MG capsule Take 1,334 mg by mouth 3 (three) times daily with meals.   Yes Historical Provider, MD  carisoprodol (SOMA) 350 MG tablet Take 350 mg by mouth 4 (four) times daily as needed (for cramps). 04/16/13  Yes Mahima  Bubba Camp, MD  carvedilol (COREG) 3.125 MG tablet Take 3.125 mg by mouth 2 (two) times daily with a meal.  02/05/13  Yes Mahima Pandey, MD  cinacalcet (SENSIPAR) 30 MG tablet Take 30 mg by mouth every evening.   Yes Historical Provider, MD  darbepoetin (ARANESP) 60 MCG/0.3ML SOLN injection Inject 0.3 mLs (60 mcg total) into the vein every Wednesday with hemodialysis. 04/03/13  Yes Geradine Girt, DO  dextromethorphan-guaiFENesin (MUCINEX DM) 30-600 MG per 12 hr tablet Take 1 tablet by mouth 2 (two) times daily.   Yes Historical Provider, MD  docusate sodium (COLACE) 100 MG capsule Take 100 mg by mouth 2 (two) times daily.   Yes Historical Provider, MD  doxercalciferol (HECTOROL) 4 MCG/2ML injection Inject 1 mcg into the vein every Monday, Wednesday, and Friday with hemodialysis. 04/03/13  Yes Geradine Girt, DO  isosorbide mononitrate (IMDUR) 60 MG 24 hr tablet Take 1 tablet (60 mg total) by mouth daily. 05/17/11 03/22/97 Yes Alma Vivi Ferns, MD  levofloxacin (LEVAQUIN) 500 MG tablet Take 1 tablet (500 mg total) by mouth every other day. 05/10/13  Yes Pricilla Larsson, NP  loperamide (IMODIUM) 2 MG capsule Take 2-4 mg by mouth daily as needed for diarrhea or loose stools.   Yes Historical Provider, MD  multivitamin (RENA-VIT) TABS tablet Take 1 tablet by mouth at bedtime.   Yes Historical Provider, MD  Nutritional Supplements (FEEDING SUPPLEMENT, NEPRO CARB STEADY,) LIQD Take 237 mLs by mouth 2 (two) times daily between meals. 04/03/13  Yes Geradine Girt, DO  senna (SENOKOT) 8.6 MG TABS tablet Take 1 tablet by mouth 2 (two) times daily.   Yes Historical Provider, MD  simvastatin (ZOCOR) 10 MG tablet Take 10 mg by mouth at bedtime.   Yes Historical Provider, MD  thiamine (VITAMIN B-1) 100 MG tablet Take 100 mg by mouth at bedtime.   Yes Historical Provider, MD   Physical Exam: Filed Vitals:   05/13/13 1446  BP: 113/31  Pulse: 54  Temp:   Resp: 20    BP 113/31  Pulse 54  Temp(Src) 99.3 F (37.4 C)  (Oral)  Resp 20  SpO2 98%  General:  Appears calm and comfortable, responsive to pain Eyes: PERRL, normal lids, irises & conjunctiva ENT: Dry lips & tongue Neck: no LAD, masses or thyromegaly Cardiovascular: RRR, no m/r/g. No LE edema. Telemetry: SR, no arrhythmias  Respiratory: CTA bilaterally, no w/r/r. Normal respiratory effort. Abdomen: soft, ntnd Skin: no rash or induration seen on limited exam Musculoskeletal: grossly normal tone BUE/BLE Psychiatric: grossly normal mood and affect, speech fluent and appropriate Neurologic: He moves all 4 extremities to pain.           Labs on Admission:  Basic Metabolic Panel:  Recent Labs Lab 05/13/13 0932  NA 141  K 5.0  CL 95*  CO2 26  GLUCOSE 92  BUN 65*  CREATININE 5.93*  CALCIUM 9.2   Liver Function Tests:  Recent Labs Lab 05/13/13 0932  AST 68*  ALT 32  ALKPHOS 109  BILITOT 0.3  PROT 6.3  ALBUMIN 3.0*   No results found for this basename: LIPASE, AMYLASE,  in the last 168 hours No results found for this basename: AMMONIA,  in the last 168 hours CBC:  Recent Labs Lab 05/13/13 0932  WBC 9.2  NEUTROABS 6.7  HGB 10.0*  HCT 31.3*  MCV 101.6*  PLT 163   Cardiac Enzymes:  Recent Labs Lab 05/13/13 1029  TROPONINI <0.30    BNP (last 3 results) No results found for this basename: PROBNP,  in the last 8760 hours CBG: No results found for this basename: GLUCAP,  in the last 168 hours  Radiological Exams on Admission: Ct Head Wo Contrast  05/13/2013   CLINICAL DATA:  Decreased level of consciousness.  EXAM: CT HEAD WITHOUT CONTRAST  TECHNIQUE: Contiguous axial images were obtained from the base of the skull through the vertex without intravenous contrast.  COMPARISON:  None.  FINDINGS: Moderate atrophy. Chronic infarct in the left parietal lobe. Negative for acute infarct. Negative for hemorrhage or mass lesion.  Air-fluid levels in the sphenoid sinus. Mucosal edema in the ethmoid sinuses. Small air-fluid  levels in the frontal sinuses. No acute bony lesion.  IMPRESSION: Atrophy and chronic left parietal infarct. No acute intracranial abnormality.  Sinusitis with air-fluid levels.   Electronically Signed   By: Franchot Gallo M.D.   On: 05/13/2013 10:07   Dg Chest Port 1 View  05/13/2013   CLINICAL DATA:  Cough  EXAM: PORTABLE CHEST - 1 VIEW  COMPARISON:  03/27/2013  FINDINGS: Stable right IJ dialysis catheter tips at the SVC RA junction. Cardiomegaly evident with a a chronic right effusion versus pleural thickening. Right base atelectasis versus scarring noted. Stable left lung aeration. No pneumothorax or superimposed acute process. Atherosclerosis of the aorta.  IMPRESSION: Stable cardiomegaly and chronic findings of the right hemi thorax. No interval change or superimposed acute process   Electronically Signed   By: Daryll Brod M.D.   On: 05/13/2013 10:01    EKG: Independently reviewed. Normal sinus rhythm nonspecific T wave changes.  Assessment/Plan Cough/Upper respiratory infection/  Metabolic encephalopathy/Vascular dementia: - The most likely cause of his metabolic encephalopathy would be an upper respiratory infection with a combination of dehydration. - I will go ahead and start him on IV fluids, consult renal for possible dialysis. He's been complaining of a productive cough change in color. Chest x-ray did not show an infiltrate. Start him on empiric treatment with Vanc and cefepime for possible healthcare associated pneumonia. We'll get a swallowing evaluation. - Currently wheezing on physical exam start low-dose steroids. - Continue inhalers. Discontinue Levaquin. - Check influenza PCR.  ESRD on dialysis: - Consult renal.  Hypertension: - Continue current home meds.  Diarrhea: - As per family member has been having loose stools 2-4 bowel movements for the last 3 days since he is to on Levaquin. We'll go ahead and DC this. Check a C. difficile PCR.   Code Status: full Family  Communication: daughter Disposition Plan: inpatient  Time spent: 73 minutes  Charlynne Cousins [facility] Pager [phone]

## 2013-05-13 NOTE — ED Notes (Signed)
Pt arrived by [facility] from [facility] assisted living. Pt daughter went to visit him this morning and pt was sleeping, daughter told EMS she thinks that he is dehydrated and wanted to have pt evaluated in the ED.

## 2013-05-13 NOTE — ED Provider Notes (Signed)
CSN: [id number]     Arrival date & time 05/13/13  [id number] History   First MD Initiated Contact with Patient 05/13/13 [phone]     Chief Complaint  Patient presents with  . Dehydration     (Consider location/radiation/quality/duration/timing/severity/associated sxs/prior Treatment) HPI Comments: Level V caveat due to decreased LOC, dementia.   Patient with history of dementia, ESRD on dialysis (MWF) -- presents with family with complaint of decreased level of consciousness, diarrhea, concern for dehydration. Patient has had 3 large loose stools a day for the past 3 days. Patient was recently seen by PCP diagnosed with upper respiratory infection and placed on Levaquin for one week.  The history is provided by medical records and a relative.    Past Medical History  Diagnosis Date  . Hypertension   . ESRD on dialysis     MONDAY, [place], and FRIDAY  . Stroke 2005  . Anemia   . Secondary hyperparathyroidism   . Peripheral vascular disease, unspecified   . Basal cell carcinoma   . BPH (benign prostatic hypertrophy)   . History of Clostridium difficile infection   . Cataract   . Abnormal nuclear stress test 2005    Mild distal anterior ischemia on nuclear test  . Shortness of breath   . Dementia   . C. difficile colitis    Past Surgical History  Procedure Laterality Date  . Insertion of dialysis catheter  09/27/2010    Left IJ Diatek Catheter  . Above knee leg amputation  12/26/2004    Left AKA by Dr. Oneida Alar  . Tonsillectomy and adenoidectomy    . Thrombectomy / arteriovenous graft revision      Numerous revision and declots  . Femoral bypass  2005    Left Femoral to posterior tibial BPG w/ composite PTFE and vein  . Hernia repair    . Appendectomy    . Cataract extraction      Left eye   . Arteriovenous graft placement     Family History  Problem Relation Age of Onset  . Heart disease Maternal Uncle   . Heart attack Father     47's   . Heart attack Sister     4's     History  Substance Use Topics  . Smoking status: Never Smoker   . Smokeless tobacco: Never Used  . Alcohol Use: No    Review of Systems  Unable to perform ROS: Mental status change      Allergies  Review of patient's allergies indicates no known allergies.  Home Medications   Current Outpatient Rx  Name  Route  Sig  Dispense  Refill  . albuterol (PROVENTIL) (2.5 MG/3ML) 0.083% nebulizer solution   Nebulization   Take 3 mLs (2.5 mg total) by nebulization every 2 (two) hours as needed for wheezing or shortness of breath.   75 mL   12   . aspirin EC 81 MG tablet   Oral   Take 81 mg by mouth at bedtime.         . calcium acetate (PHOSLO) 667 MG capsule   Oral   Take 1,334 mg by mouth 3 (three) times daily with meals.         . carisoprodol (SOMA) 350 MG tablet      Take one tablet by mouth four times daily as needed for cramps   120 tablet   5   . carvedilol (COREG) 3.125 MG tablet   Oral   Take 3.125 mg by  mouth 2 (two) times daily with a meal.          . cinacalcet (SENSIPAR) 30 MG tablet   Oral   Take 30 mg by mouth every evening.         . darbepoetin (ARANESP) 60 MCG/0.3ML SOLN injection   Intravenous   Inject 0.3 mLs (60 mcg total) into the vein every Wednesday with hemodialysis.   4.2 mL      . docusate sodium (COLACE) 100 MG capsule   Oral   Take 100 mg by mouth 2 (two) times daily.         Marland Kitchen doxercalciferol (HECTOROL) 4 MCG/2ML injection   Intravenous   Inject 0.5 mLs (1 mcg total) into the vein every Monday, Wednesday, and Friday with hemodialysis.   2 mL      . isosorbide mononitrate (IMDUR) 60 MG 24 hr tablet   Oral   Take 1 tablet (60 mg total) by mouth daily.   30 tablet   0   . levofloxacin (LEVAQUIN) 500 MG tablet   Oral   Take 1 tablet (500 mg total) by mouth every other day.   3 tablet   0   . multivitamin (RENA-VIT) TABS tablet   Oral   Take 1 tablet by mouth at bedtime.         . Nutritional Supplements  (FEEDING SUPPLEMENT, NEPRO CARB STEADY,) LIQD   Oral   Take 237 mLs by mouth 2 (two) times daily between meals.      0   . senna (SENOKOT) 8.6 MG TABS tablet   Oral   Take 1 tablet by mouth 2 (two) times daily.         . simvastatin (ZOCOR) 10 MG tablet   Oral   Take 10 mg by mouth at bedtime.         . thiamine (VITAMIN B-1) 100 MG tablet   Oral   Take 100 mg by mouth at bedtime.          BP 115/33  Pulse 94  Temp(Src) 99.3 F (37.4 C) (Oral)  Resp 14  SpO2 98% Physical Exam  Nursing note and vitals reviewed. Constitutional: He appears well-developed and well-nourished.  HENT:  Head: Normocephalic and atraumatic.  Right Ear: External ear normal.  Left Ear: External ear normal.  Nose: Nose normal.  Mouth/Throat: Oropharynx is clear and moist.  Eyes: Conjunctivae are normal. Right eye exhibits no discharge. Left eye exhibits no discharge.  Neck: Normal range of motion. Neck supple.  Cardiovascular: Normal rate, regular rhythm and normal heart sounds.   Pulmonary/Chest: Effort normal and breath sounds normal. He has no wheezes. He has no rales.  Scattered rhonchi, irregular breathing pattern.   Abdominal: Soft. There is no tenderness. There is no rebound and no guarding.  Musculoskeletal:  Left AKA  Neurological:  Patient rouses to voice then falls back to sleep.   Skin: Skin is warm and dry.  Psychiatric: He has a normal mood and affect.    ED Course  Procedures (including critical care time) Labs Review Labs Reviewed  CBC WITH DIFFERENTIAL - Abnormal; Notable for the following:    RBC 3.08 (*)    Hemoglobin 10.0 (*)    HCT 31.3 (*)    MCV 101.6 (*)    RDW 17.6 (*)    Monocytes Relative 13 (*)    Monocytes Absolute 1.2 (*)    All other components within normal limits  COMPREHENSIVE METABOLIC PANEL - Abnormal; Notable for  the following:    Chloride 95 (*)    BUN 65 (*)    Creatinine, Ser 5.93 (*)    Albumin 3.0 (*)    AST 68 (*)    GFR calc non  Af Amer 7 (*)    GFR calc Af Amer 9 (*)    All other components within normal limits  I-STAT TROPOININ, ED - Abnormal; Notable for the following:    Troponin i, poc 0.10 (*)    All other components within normal limits  I-STAT VENOUS BLOOD GAS, ED - Abnormal; Notable for the following:    pH, Ven 7.311 (*)    pCO2, Ven 54.6 (*)    Bicarbonate 27.5 (*)    All other components within normal limits  TROPONIN I  BLOOD GAS, VENOUS  URINALYSIS, ROUTINE W REFLEX MICROSCOPIC  I-STAT CG4 LACTIC ACID, ED   Imaging Review Ct Head Wo Contrast  05/13/2013   CLINICAL DATA:  Decreased level of consciousness.  EXAM: CT HEAD WITHOUT CONTRAST  TECHNIQUE: Contiguous axial images were obtained from the base of the skull through the vertex without intravenous contrast.  COMPARISON:  None.  FINDINGS: Moderate atrophy. Chronic infarct in the left parietal lobe. Negative for acute infarct. Negative for hemorrhage or mass lesion.  Air-fluid levels in the sphenoid sinus. Mucosal edema in the ethmoid sinuses. Small air-fluid levels in the frontal sinuses. No acute bony lesion.  IMPRESSION: Atrophy and chronic left parietal infarct. No acute intracranial abnormality.  Sinusitis with air-fluid levels.   Electronically Signed   By: Franchot Gallo M.D.   On: 05/13/2013 10:07   Dg Chest Port 1 View  05/13/2013   CLINICAL DATA:  Cough  EXAM: PORTABLE CHEST - 1 VIEW  COMPARISON:  03/27/2013  FINDINGS: Stable right IJ dialysis catheter tips at the SVC RA junction. Cardiomegaly evident with a a chronic right effusion versus pleural thickening. Right base atelectasis versus scarring noted. Stable left lung aeration. No pneumothorax or superimposed acute process. Atherosclerosis of the aorta.  IMPRESSION: Stable cardiomegaly and chronic findings of the right hemi thorax. No interval change or superimposed acute process   Electronically Signed   By: Daryll Brod M.D.   On: 05/13/2013 10:01    EKG Interpretation     Date/Time:  Sunday May 13 2013 09:21:41 EST Ventricular Rate:  58 PR Interval:  181 QRS Duration: 79 QT Interval:  458 QTC Calculation: 450 R Axis:   19 Text Interpretation:  Sinus rhythm Minimal ST depression, lateral leads Confirmed by HARRISON  MD, FORREST ([id number]) on 05/13/2013 9:37:57 AM           8:59 AM Patient seen and examined. Work-up initiated.    Vital signs reviewed and are as follows: Filed Vitals:   05/13/13 0839  BP: 115/33  Pulse: 94  Temp: 99.3 F (37.4 C)  Resp: 14   9:26 AM Discussed with Dr. Aline Brochure who will see.   2:09 PM Patient has remained sleepy during ED stay. Exam is unchanged.   I have asked SW and case manager to see as patient lives at home with minimal assistance at independent living. I do not feel patient will be able to get himself to dialysis in his current state.   I spoke with Dr. Venetia Constable who agrees to admit under observation with ultimate disposition (home vs placement into facility) based on completion of work-up as inpatient.    MDM   Final diagnoses:  Altered level of consciousness   Admit. Patient not  safe for discharge. Unclear cause of worsening mental status.     Carlisle Cater, PA-C 05/13/13 1426

## 2013-05-14 ENCOUNTER — Encounter (HOSPITAL_COMMUNITY): Payer: Self-pay | Admitting: Nephrology

## 2013-05-14 DIAGNOSIS — J9600 Acute respiratory failure, unspecified whether with hypoxia or hypercapnia: Secondary | ICD-10-CM

## 2013-05-14 DIAGNOSIS — N189 Chronic kidney disease, unspecified: Secondary | ICD-10-CM

## 2013-05-14 DIAGNOSIS — I4891 Unspecified atrial fibrillation: Secondary | ICD-10-CM

## 2013-05-14 DIAGNOSIS — D631 Anemia in chronic kidney disease: Secondary | ICD-10-CM

## 2013-05-14 DIAGNOSIS — R404 Transient alteration of awareness: Secondary | ICD-10-CM

## 2013-05-14 DIAGNOSIS — N039 Chronic nephritic syndrome with unspecified morphologic changes: Secondary | ICD-10-CM

## 2013-05-14 LAB — COMPREHENSIVE METABOLIC PANEL
ALT: 30 U/L (ref 0–53)
AST: 58 U/L — ABNORMAL HIGH (ref 0–37)
Albumin: 2.6 g/dL — ABNORMAL LOW (ref 3.5–5.2)
Alkaline Phosphatase: 91 U/L (ref 39–117)
BILIRUBIN TOTAL: 0.2 mg/dL — AB (ref 0.3–1.2)
BUN: 78 mg/dL — AB (ref 6–23)
CALCIUM: 8.1 mg/dL — AB (ref 8.4–10.5)
CHLORIDE: 98 meq/L (ref 96–112)
CO2: 21 meq/L (ref 19–32)
CREATININE: 7.08 mg/dL — AB (ref 0.50–1.35)
GFR, EST AFRICAN AMERICAN: 7 mL/min — AB (ref >90)
GFR, EST NON AFRICAN AMERICAN: 6 mL/min — AB (ref >90)
Glucose, Bld: 81 mg/dL (ref 70–99)
Potassium: 5.4 mEq/L — ABNORMAL HIGH (ref 3.7–5.3)
Sodium: 139 mEq/L (ref 137–147)
Total Protein: 5.5 g/dL — ABNORMAL LOW (ref 6.0–8.3)

## 2013-05-14 LAB — CBC
HCT: 26.7 % — ABNORMAL LOW (ref 39.0–52.0)
HEMOGLOBIN: 8.5 g/dL — AB (ref 13.0–17.0)
MCH: 32 pg (ref 26.0–34.0)
MCHC: 31.8 g/dL (ref 30.0–36.0)
MCV: 100.4 fL — ABNORMAL HIGH (ref 78.0–100.0)
PLATELETS: 149 10*3/uL — AB (ref 150–400)
RBC: 2.66 MIL/uL — AB (ref 4.22–5.81)
RDW: 17.3 % — ABNORMAL HIGH (ref 11.5–15.5)
WBC: 6.9 10*3/uL (ref 4.0–10.5)

## 2013-05-14 LAB — INFLUENZA PANEL BY PCR (TYPE A & B)
H1N1FLUPCR: NOT DETECTED
INFLAPCR: POSITIVE — AB
INFLBPCR: NEGATIVE

## 2013-05-14 MED ORDER — NEPRO/CARBSTEADY PO LIQD
237.0000 mL | Freq: Two times a day (BID) | ORAL | Status: DC
  Administered 2013-05-15 – 2013-05-17 (×4): 237 mL via ORAL
  Filled 2013-05-14 (×13): qty 237

## 2013-05-14 MED ORDER — SODIUM CHLORIDE 0.9 % IV SOLN
125.0000 mg | INTRAVENOUS | Status: DC
  Administered 2013-05-16 – 2013-05-18 (×2): 125 mg via INTRAVENOUS
  Filled 2013-05-14 (×5): qty 10

## 2013-05-14 MED ORDER — OSELTAMIVIR PHOSPHATE 30 MG PO CAPS: 30.0000 mg | ORAL_CAPSULE | Freq: Two times a day (BID) | ORAL | Status: DC

## 2013-05-14 MED ORDER — OSELTAMIVIR PHOSPHATE 30 MG PO CAPS
30.0000 mg | ORAL_CAPSULE | ORAL | Status: AC
  Administered 2013-05-14 – 2013-05-18 (×3): 30 mg via ORAL
  Filled 2013-05-14 (×3): qty 1

## 2013-05-14 MED ORDER — DARBEPOETIN ALFA-POLYSORBATE 60 MCG/0.3ML IJ SOLN
INTRAMUSCULAR | Status: AC
  Administered 2013-05-14: 60 ug via INTRAVENOUS
  Filled 2013-05-14: qty 0.3

## 2013-05-14 MED ORDER — DOXERCALCIFEROL 4 MCG/2ML IV SOLN
INTRAVENOUS | Status: AC
Start: 2013-05-14 — End: 2013-05-14
  Administered 2013-05-14: 1 ug via INTRAVENOUS
  Filled 2013-05-14: qty 2

## 2013-05-14 MED ORDER — DARBEPOETIN ALFA-POLYSORBATE 60 MCG/0.3ML IJ SOLN
60.0000 ug | INTRAMUSCULAR | Status: DC
  Administered 2013-05-14: 60 ug via INTRAVENOUS
  Filled 2013-05-14: qty 0.3

## 2013-05-14 NOTE — Evaluation (Signed)
Physical Therapy Evaluation Patient Details Name: Logan [name] MRN: [id number] DOB: 11-24-1919 Today's Date: 05/14/2013 Time: 3716-9678 PT Time Calculation (min): 18 min  PT Assessment / Plan / Recommendation History of Present Illness  80 y.o. male admitted to [facility] on 05/13/13 with decreased LOC and upper resp infection. He receives HD MWF @ [facility], history of HTN, stroke, dementia, PVD. He was started on levaquin by his PCP 2 days ago and per his family started having diarrhea and became difficult to arouse.  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    Clinical Impression  Pt is limited by lethargy.  He needs constant cues to stay awake to participate in PT evaluation.  I am also not getting accurate information out of his wife who is in the room (she seems confused).  The pt in his current state is not appropriate to return to ALF [facility]).  His safest destination is SNF placement at d/c.   PT to follow acutely for deficits listed below.       PT Assessment  Patient needs continued PT services    Follow Up Recommendations  SNF    Does the patient have the potential to tolerate intense rehabilitation     NA  Barriers to Discharge Decreased caregiver support If pt came from ALF or independent apartment he is not currently appropriate to return to this level of care with a wife who looks to have balance issues herself.      Equipment Recommendations  None recommended by PT    Recommendations for Other Services   None  Frequency Min 2X/week    Precautions / Restrictions Precautions Precautions: Fall   Pertinent Vitals/Pain O2 sats and HR stable on 2 L O2 [place] throughout session.  See vitals flow sheet.      Mobility  Bed Mobility Overal bed mobility: Needs Assistance Bed Mobility: Rolling Rolling: Min assist General bed mobility comments: Min assist to get trunk in fully sidelying position verbal and manual cues to use railing for support.  Pt with cues and assist to find the  railing was also able to scoot up to [facility] with mod assist and bed in maximal trendelenberg.   Transfers Overall transfer level:  (will need +2 for safety to attempt sitting and transfers. )        PT Diagnosis: Difficulty walking;Abnormality of gait;Generalized weakness;Altered mental status  PT Problem List: Decreased strength;Decreased activity tolerance;Decreased balance;Decreased mobility;Decreased range of motion;Decreased cognition;Decreased knowledge of use of DME PT Treatment Interventions: DME instruction;Functional mobility training;Therapeutic activities;Therapeutic exercise;Balance training;Neuromuscular re-education;Cognitive remediation;Patient/family education;Wheelchair mobility training     PT Goals(Current goals can be found in the care plan section) Acute Rehab PT Goals Patient Stated Goal: none stated due to lethargy PT Goal Formulation: Patient unable to participate in goal setting Time For Goal Achievement: 05/28/13 Potential to Achieve Goals: Fair  Visit Information  Last PT Received On: 05/14/13 Assistance Needed: +2 History of Present Illness: 80 y.o. male admitted to [facility] on 05/13/13 with decreased LOC and upper resp infection. He [facility] MWF @ [facility], history of HTN, stroke, dementia, PVD. He was started on levaquin by his PCP 2 days ago and per his family started having diarrhea and became difficult to arouse.  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         Prior [place] expects to be discharged to:: Skilled nursing facility Living Arrangements: Spouse/significant other Additional Comments: Wife very HOH and seems confused  as well.   Prior Function Level of Independence: Needs assistance Comments: Wife unable to relay accurate info, so unsure of his premorbid level of mobility.   Communication Communication: HOH    Cognition  Cognition Arousal/Alertness: Lethargic Behavior During Therapy: WFL for tasks assessed/performed Overall  Cognitive Status: Impaired/Different from baseline Area of Impairment: Attention Current Attention Level: Focused (likely due to lethargy) General Comments: It is difficult to really assess his cognition as he needed constant cues to stay awake and follow one step commands.      Extremity/Trunk Assessment Upper Extremity Assessment Upper Extremity Assessment: Generalized weakness Lower Extremity Assessment Lower Extremity Assessment: RLE deficits/detail;LLE deficits/detail RLE Deficits / Details: right leg with decreased full extension of his right knee (lacking ~15 degrees likely due to tight/contracted hamstrings).  Pt able to move right leg against gravity 3/5 at least.  Stiff right ankle lacking ~5 degrees of neutral dorsiflexion indicating that he has likely not used that foot to stand in a long time.   LLE Deficits / Details: left leg with h/o AKA with good ROM and pt able to move it against gravity at a 3/5 strength level.   Cervical / Trunk Assessment Cervical / Trunk Assessment: Normal   Balance General Comments General comments (skin integrity, edema, etc.): Repositioned pt on his left side (he was right sidelying when PT entered the room) to help with pressure relief.    End of Session PT - End of Session Equipment Utilized During Treatment: Oxygen Activity Tolerance: Patient limited by fatigue Patient left: in bed;with call bell/phone within reach;with family/visitor present Nurse Communication: Mobility status;Other (comment) (wife is confused)    Wells Guiles B. [place], [place], DPT [phone]   05/14/2013, 4:39 PM

## 2013-05-14 NOTE — ED Provider Notes (Signed)
Medical screening examination/treatment/procedure(s) were conducted as a shared visit with non-physician practitioner(s) and myself.  I personally evaluated the patient during the encounter.     Date: 05/14/2013  Rate: 58  Rhythm: normal sinus rhythm  QRS Axis: normal  Intervals: normal  ST/T Wave abnormalities: nonspecific ST changes  Conduction Disutrbances:none  Narrative Interpretation: minimal ST depressions in lateral leads  Old EKG Reviewed: changes noted  I interviewed and examined the patient. Lungs w scattered rhonchi. Cardiac exam wnl. Abdomen soft. Pt is drowsy on exam, but arouses w/ tactile/verbal stimulation. Plan for admission as pt will not be able to get to HD w/ his wife.   Blanchard Kelch, MD 05/14/13 [phone]

## 2013-05-14 NOTE — Clinical Social Work Placement (Signed)
Clinical Social Work Department  CLINICAL SOCIAL WORK PLACEMENT NOTE  Patient: Logan Orlick FredricksonAccount Number: [id number] [place] date: 05/13/13 Clinical Social Worker: Rhea Pink LCSWA Date/time: 05/14/2013 2:30 PM  Clinical Social Work is seeking post-discharge placement for this patient at the following level of care: SKILLED NURSING (*CSW will update this form in Epic as items are completed)  2/23/2015Patient/family provided with [place] Department of Clinical Social Work's list of facilities offering this level of care within the geographic area requested by the patient (or if unable, by the patient's family).  2/23/2015Patient/family informed of their freedom to choose among providers that offer the needed level of care, that participate in Medicare, Medicaid or managed care program needed by the patient, have an available bed and are willing to accept the patient.  05/14/2013 Patient/family informed of [facility]' ownership interest in [facility], as well as of the fact that they are under no obligation to receive care at this facility.  PASARR submitted to EDS on Pre-existing PASARR number received from EDS on  FL2 transmitted to all facilities in geographic area requested by pt/family on 05/14/2013  FL2 transmitted to all facilities within larger geographic area on  Patient informed that his/her managed care company has contracts with or will negotiate with certain facilities, including the following:  Patient/family informed of bed offers received:  Patient chooses bed at  Physician recommends and patient chooses bed at  Patient to be transferred to on  Patient to be transferred to facility by  The following physician request were entered in Epic:  Additional Comments:

## 2013-05-14 NOTE — Consult Note (Signed)
Indication for Consultation:  Management of ESRD/hemodialysis; anemia, hypertension/volume and secondary hyperparathyroidism  HPI: Logan [name] is a 80 y.o. male admitted for decreased LOC and upper resp infection. He [facility] MWF @ [facility], history of HTN, stroke, dementia, PVD. He was started on levaquin by his PCP 2 days ago and per his family started having diarrhea and became difficult to arouse. He was brought in for eval.   Past Medical History  Diagnosis Date  . Hypertension   . ESRD on dialysis     MONDAY, [place], and FRIDAY  . Stroke 2005  . Anemia   . Secondary hyperparathyroidism   . Peripheral vascular disease, unspecified   . Basal cell carcinoma   . BPH (benign prostatic hypertrophy)   . History of Clostridium difficile infection   . Cataract   . Abnormal nuclear stress test 2005    Mild distal anterior ischemia on nuclear test  . Shortness of breath   . Dementia   . C. difficile colitis   . Sleep apnea   . H/O hiatal hernia    Past Surgical History  Procedure Laterality Date  . Insertion of dialysis catheter  09/27/2010    Left IJ Diatek Catheter  . Above knee leg amputation  12/26/2004    Left AKA by Dr. Oneida Alar  . Tonsillectomy and adenoidectomy    . Thrombectomy / arteriovenous graft revision      Numerous revision and declots  . Femoral bypass  2005    Left Femoral to posterior tibial BPG w/ composite PTFE and vein  . Hernia repair    . Appendectomy    . Cataract extraction      Left eye   . Arteriovenous graft placement     Family History  Problem Relation Age of Onset  . Heart disease Maternal Uncle   . Heart attack Father     72's   . Heart attack Sister     83's    Social History:  reports that he has never smoked. He has never used smokeless tobacco. He reports that he does not drink alcohol or use illicit drugs. No Known Allergies Prior to Admission medications   Medication Sig Start Date End Date Taking? Authorizing Provider   albuterol (PROVENTIL) (2.5 MG/3ML) 0.083% nebulizer solution Take 3 mLs (2.5 mg total) by nebulization every 2 (two) hours as needed for wheezing or shortness of breath. 04/03/13  Yes Geradine Girt, DO  aspirin EC 81 MG tablet Take 81 mg by mouth at bedtime.   Yes Historical Provider, MD  calcium acetate (PHOSLO) 667 MG capsule Take 1,334 mg by mouth 3 (three) times daily with meals.   Yes Historical Provider, MD  carisoprodol (SOMA) 350 MG tablet Take 350 mg by mouth 4 (four) times daily as needed (for cramps). 04/16/13  Yes Mahima Bubba Camp, MD  carvedilol (COREG) 3.125 MG tablet Take 3.125 mg by mouth 2 (two) times daily with a meal.  02/05/13  Yes Mahima Pandey, MD  cinacalcet (SENSIPAR) 30 MG tablet Take 30 mg by mouth every evening.   Yes Historical Provider, MD  darbepoetin (ARANESP) 60 MCG/0.3ML SOLN injection Inject 0.3 mLs (60 mcg total) into the vein every Wednesday with hemodialysis. 04/03/13  Yes Geradine Girt, DO  dextromethorphan-guaiFENesin (MUCINEX DM) 30-600 MG per 12 hr tablet Take 1 tablet by mouth 2 (two) times daily.   Yes Historical Provider, MD  docusate sodium (COLACE) 100 MG capsule Take 100 mg by mouth 2 (two) times daily.  Yes Historical Provider, MD  doxercalciferol (HECTOROL) 4 MCG/2ML injection Inject 1 mcg into the vein every Monday, Wednesday, and Friday with hemodialysis. 04/03/13  Yes Geradine Girt, DO  isosorbide mononitrate (IMDUR) 60 MG 24 hr tablet Take 1 tablet (60 mg total) by mouth daily. 05/17/11 03/22/97 Yes Alma Vivi Ferns, MD  levofloxacin (LEVAQUIN) 500 MG tablet Take 1 tablet (500 mg total) by mouth every other day. 05/10/13  Yes Pricilla Larsson, NP  loperamide (IMODIUM) 2 MG capsule Take 2-4 mg by mouth daily as needed for diarrhea or loose stools.   Yes Historical Provider, MD  multivitamin (RENA-VIT) TABS tablet Take 1 tablet by mouth at bedtime.   Yes Historical Provider, MD  Nutritional Supplements (FEEDING SUPPLEMENT, NEPRO CARB STEADY,) LIQD Take 237 mLs  by mouth 2 (two) times daily between meals. 04/03/13  Yes Geradine Girt, DO  senna (SENOKOT) 8.6 MG TABS tablet Take 1 tablet by mouth 2 (two) times daily.   Yes Historical Provider, MD  simvastatin (ZOCOR) 10 MG tablet Take 10 mg by mouth at bedtime.   Yes Historical Provider, MD  thiamine (VITAMIN B-1) 100 MG tablet Take 100 mg by mouth at bedtime.   Yes Historical Provider, MD   Current Facility-Administered Medications  Medication Dose Route Frequency Provider Last Rate Last Dose  . 0.9 %  sodium chloride infusion   Intravenous Continuous Charlynne Cousins, MD 50 mL/hr at 05/13/13 1824    . acetaminophen (TYLENOL) tablet 650 mg  650 mg Oral Q6H PRN Charlynne Cousins, MD       Or  . acetaminophen (TYLENOL) suppository 650 mg  650 mg Rectal Q6H PRN Charlynne Cousins, MD      . aspirin EC tablet 81 mg  81 mg Oral QHS Charlynne Cousins, MD   81 mg at 05/13/13 2118  . calcium acetate (PHOSLO) capsule 1,334 mg  1,334 mg Oral TID WC Charlynne Cousins, MD   1,334 mg at 05/14/13 1238  . carisoprodol (SOMA) tablet 350 mg  350 mg Oral QID PRN Charlynne Cousins, MD      . carvedilol (COREG) tablet 3.125 mg  3.125 mg Oral BID WC Charlynne Cousins, MD   3.125 mg at 05/14/13 1015  . ceFEPIme (MAXIPIME) 2 g in dextrose 5 % 50 mL IVPB  2 g Intravenous Q M,W,F-HD Charlynne Cousins, MD      . cinacalcet [facility]) tablet 30 mg  30 mg Oral QPM Charlynne Cousins, MD   30 mg at 05/13/13 2038  . darbepoetin (ARANESP) injection 60 mcg  60 mcg Intravenous Q7 days Marlena Clipper, NP      . doxercalciferol (HECTOROL) injection 1 mcg  1 mcg Intravenous Q M,W,F-HD Charlynne Cousins, MD      . ferric gluconate (NULECIT) 125 mg in sodium chloride 0.9 % 100 mL IVPB  125 mg Intravenous Q M,W,F-HD Marlena Clipper, NP      . heparin injection 5,000 Units  5,000 Units Subcutaneous 3 times per day Charlynne Cousins, MD   5,000 Units at 05/14/13 [phone]  . isosorbide mononitrate (IMDUR) 24 hr tablet  60 mg  60 mg Oral Daily Charlynne Cousins, MD   60 mg at 05/14/13 1015  . multivitamin (RENA-VIT) tablet 1 tablet  1 tablet Oral QHS Charlynne Cousins, MD   1 tablet at 05/13/13 2118  . ondansetron (ZOFRAN) tablet 4 mg  4 mg Oral Q6H PRN Charlynne Cousins, MD  Or  . ondansetron (ZOFRAN) injection 4 mg  4 mg Intravenous Q6H PRN Charlynne Cousins, MD      . simvastatin (ZOCOR) tablet 10 mg  10 mg Oral QHS Charlynne Cousins, MD   10 mg at 05/13/13 2118  . thiamine (VITAMIN B-1) tablet 100 mg  100 mg Oral QHS Charlynne Cousins, MD   100 mg at 05/13/13 2118  . vancomycin (VANCOCIN) IVPB 750 mg/150 ml premix  750 mg Intravenous Q M,W,F-HD Charlynne Cousins, MD       Labs: Basic Metabolic Panel:  Recent Labs Lab 05/13/13 0932 05/14/13 0535  NA 141 139  K 5.0 5.4*  CL 95* 98  CO2 26 21  GLUCOSE 92 81  BUN 65* 78*  CREATININE 5.93* 7.08*  CALCIUM 9.2 8.1*   Liver Function Tests:  Recent Labs Lab 05/13/13 0932 05/14/13 0535  AST 68* 58*  ALT 32 30  ALKPHOS 109 91  BILITOT 0.3 0.2*  PROT 6.3 5.5*  ALBUMIN 3.0* 2.6*   No results found for this basename: LIPASE, AMYLASE,  in the last 168 hours No results found for this basename: AMMONIA,  in the last 168 hours CBC:  Recent Labs Lab 05/13/13 0932 05/14/13 0535  WBC 9.2 6.9  NEUTROABS 6.7  --   HGB 10.0* 8.5*  HCT 31.3* 26.7*  MCV 101.6* 100.4*  PLT 163 149*   Cardiac Enzymes:  Recent Labs Lab 05/13/13 1029  TROPONINI <0.30   CBG: No results found for this basename: GLUCAP,  in the last 168 hours Iron Studies: No results found for this basename: IRON, TIBC, TRANSFERRIN, FERRITIN,  in the last 72 hours Studies/Results: Ct Head Wo Contrast  05/13/2013   CLINICAL DATA:  Decreased level of consciousness.  EXAM: CT HEAD WITHOUT CONTRAST  TECHNIQUE: Contiguous axial images were obtained from the base of the skull through the vertex without intravenous contrast.  COMPARISON:  None.  FINDINGS: Moderate  atrophy. Chronic infarct in the left parietal lobe. Negative for acute infarct. Negative for hemorrhage or mass lesion.  Air-fluid levels in the sphenoid sinus. Mucosal edema in the ethmoid sinuses. Small air-fluid levels in the frontal sinuses. No acute bony lesion.  IMPRESSION: Atrophy and chronic left parietal infarct. No acute intracranial abnormality.  Sinusitis with air-fluid levels.   Electronically Signed   By: Franchot Gallo M.D.   On: 05/13/2013 10:07   Dg Chest Port 1 View  05/13/2013   CLINICAL DATA:  Cough  EXAM: PORTABLE CHEST - 1 VIEW  COMPARISON:  03/27/2013  FINDINGS: Stable right IJ dialysis catheter tips at the SVC RA junction. Cardiomegaly evident with a a chronic right effusion versus pleural thickening. Right base atelectasis versus scarring noted. Stable left lung aeration. No pneumothorax or superimposed acute process. Atherosclerosis of the aorta.  IMPRESSION: Stable cardiomegaly and chronic findings of the right hemi thorax. No interval change or superimposed acute process   Electronically Signed   By: Daryll Brod M.D.   On: 05/13/2013 10:01    ROS: Unable to adequately obtain, Pt answers no to all questions asked.  Physical Exam: Filed Vitals:   05/13/13 1645 05/13/13 1700 05/13/13 2148 05/14/13 0533  BP: 112/72 125/28 123/45 123/58  Pulse:  51 57 52  Temp:  98.3 F (36.8 C) 97.5 F (36.4 C) 98.7 F (37.1 C)  TempSrc:  Oral Axillary Axillary  Resp: 19 18 18 18   SpO2:  98% 100% 100%     General: Well developed, well nourished, in  no acute distress. Head: Normocephalic, atraumatic, sclera non-icteric, mucus membranes are moist Neck: Supple. JVD not elevated. Lungs: Anterior exam only-Clear bilaterally to auscultation without wheezes, rales, or rhonchi. Breathing is unlabored. Heart: RRR with S1 S2. No murmurs, rubs, or gallops appreciated. 2/6 SEM Abdomen: Soft, non-tender, non-distended with normoactive bowel sounds. No rebound/guarding. No obvious abdominal  masses. M-S:  Strength and tone appear normal for age. Lower extremities:without edema or ischemic changes, no open wounds Old HD accesses both arms are benign on exam Neuro: Confused, answers 'no' to all questions asked. Moves all extremities spontaneously. Dialysis Access: R cath  Dialysis Orders: MWF @[facility]  68kgs  4hr 2k/2.25Ca+  400/800 7500 u Heparin R cath hectorol 1 mcg IV/HD Epogen 6000 Units IV/HD  Venofer  100mg  x5 (stop 3/2, for tsat22) Profile 4  Assessment/Plan: 1. Acute bronchitis / early PNA- work up per primary, Influenza A+. Sputum culture and UA pending. Cdiff negative. WBC 6.9. vanc and cefepime per primary. Was started on levaquin 2 days ago by PCP- now [facility] 2. Decreased LOC- head CT- no acute abnormalitity- dementia at baseline 3.  ESRD -  MWF@ [facility], HD pending today 4.  Hypertension/volume  - 131/51, cont home coreg and imdur. 5.  Anemia  - hgb 8.5- cont outpt ESA and iron (tsat22) 6.  Metabolic bone disease -  Ca+ 8.1,Phos 3.8 cont hectorol, sensipar and phoslo with meals 7.  Nutrition - alb 2.6, dysphagia diet pending swollow  study. Start nepro. Multivit. 8. Dementia / hx CVA-  lives at [facility] assisted living  Shelle Iron, NP [place] [phone] 05/14/2013, 2:13 PM   I have seen and examined patient, discussed with PA and agree with assessment and plan as outlined above.  Stop fluids, HD today. Pt very somnolent, not familiar with baseline.   Kelly Splinter MD pager [phone]    cell [phone] 05/14/2013, 3:53 PM

## 2013-05-14 NOTE — Progress Notes (Signed)
[facility] PROGRESS NOTE   Logan [name] BHA:[id number] DOB: 1920/03/05 DOA: 05/13/2013 PCP: Blanchie Serve, MD  HPI/Subjective: Denies any shortness of breath, fever or chills.  Assessment/Plan: Active Problems:   ESRD on dialysis   Vascular dementia   Hypertension   Upper respiratory infection   Diarrhea   Cough   Metabolic encephalopathy   Acute bronchitis   Acute bronchitis -Patient has cough with upper respiratory symptoms so started on vancomycin and cefepime. -Currently is being treated for acute bronchitis/early HCAP/aspiration. -Continue current antibiotics. -Had bronchodilators, mucolytics, oxygen as needed and antitussives.  ESRD -Nephrology consulted routine hemodialysis. -Patient gets dialysis on Monday, Wednesday and Friday.  Hypertension -Home medications continued.  Diarrhea -Patient reported to have 2-4 bowel movements per day, he was recently started on Levaquin. -C. difficile PCR was checked and it was negative   Code Status: DO NOT RESUSCITATE Family Communication: Plan discussed with the patient. Disposition Plan: Remains inpatient   Consultants:  Nephrology  Procedures:  Hemodialysis  Antibiotics:  Vancomycin and cefepime.   Objective: Filed Vitals:   05/14/13 0533  BP: 123/58  Pulse: 52  Temp: 98.7 F (37.1 C)  Resp: 18    Intake/Output Summary (Last 24 hours) at 05/14/13 1208 Last data filed at 05/14/13 0820  Gross per 24 hour  Intake    120 ml  Output      0 ml  Net    120 ml   There were no vitals filed for this visit.  Exam: General: Alert and awake, oriented x3, not in any acute distress. HEENT: anicteric sclera, pupils reactive to light and accommodation, EOMI CVS: S1-S2 clear, no murmur rubs or gallops Chest: clear to auscultation bilaterally, no wheezing, rales or rhonchi Abdomen: soft nontender, nondistended, normal bowel sounds, no organomegaly Extremities: no cyanosis, clubbing or edema  noted bilaterally Neuro: Cranial nerves II-XII intact, no focal neurological deficits  Data Reviewed: Basic Metabolic Panel:  Recent Labs Lab 05/13/13 0932 05/14/13 0535  NA 141 139  K 5.0 5.4*  CL 95* 98  CO2 26 21  GLUCOSE 92 81  BUN 65* 78*  CREATININE 5.93* 7.08*  CALCIUM 9.2 8.1*   Liver Function Tests:  Recent Labs Lab 05/13/13 0932 05/14/13 0535  AST 68* 58*  ALT 32 30  ALKPHOS 109 91  BILITOT 0.3 0.2*  PROT 6.3 5.5*  ALBUMIN 3.0* 2.6*   No results found for this basename: LIPASE, AMYLASE,  in the last 168 hours No results found for this basename: AMMONIA,  in the last 168 hours CBC:  Recent Labs Lab 05/13/13 0932 05/14/13 0535  WBC 9.2 6.9  NEUTROABS 6.7  --   HGB 10.0* 8.5*  HCT 31.3* 26.7*  MCV 101.6* 100.4*  PLT 163 149*   Cardiac Enzymes:  Recent Labs Lab 05/13/13 1029  TROPONINI <0.30   BNP (last 3 results) No results found for this basename: PROBNP,  in the last 8760 hours CBG: No results found for this basename: GLUCAP,  in the last 168 hours  Micro Recent Results (from the past 240 hour(s))  CULTURE, EXPECTORATED SPUTUM-ASSESSMENT     Status: None   Collection Time    05/13/13  8:48 PM      Result Value Ref Range Status   Specimen Description SPUTUM   Final   Special Requests NONE   Final   Sputum evaluation     Final   Value: MICROSCOPIC FINDINGS SUGGEST THAT THIS SPECIMEN IS NOT REPRESENTATIVE OF LOWER RESPIRATORY SECRETIONS. PLEASE RECOLLECT.  RESULT CALLED TO, READ BACK BY AND VERIFIED WITHLannie Fields RN [phone] GREEN R   Report Status 05/13/2013 FINAL   Final     Studies: Ct Head Wo Contrast  05/13/2013   CLINICAL DATA:  Decreased level of consciousness.  EXAM: CT HEAD WITHOUT CONTRAST  TECHNIQUE: Contiguous axial images were obtained from the base of the skull through the vertex without intravenous contrast.  COMPARISON:  None.  FINDINGS: Moderate atrophy. Chronic infarct in the left parietal lobe. Negative for  acute infarct. Negative for hemorrhage or mass lesion.  Air-fluid levels in the sphenoid sinus. Mucosal edema in the ethmoid sinuses. Small air-fluid levels in the frontal sinuses. No acute bony lesion.  IMPRESSION: Atrophy and chronic left parietal infarct. No acute intracranial abnormality.  Sinusitis with air-fluid levels.   Electronically Signed   By: Franchot Gallo M.D.   On: 05/13/2013 10:07   Dg Chest Port 1 View  05/13/2013   CLINICAL DATA:  Cough  EXAM: PORTABLE CHEST - 1 VIEW  COMPARISON:  03/27/2013  FINDINGS: Stable right IJ dialysis catheter tips at the SVC RA junction. Cardiomegaly evident with a a chronic right effusion versus pleural thickening. Right base atelectasis versus scarring noted. Stable left lung aeration. No pneumothorax or superimposed acute process. Atherosclerosis of the aorta.  IMPRESSION: Stable cardiomegaly and chronic findings of the right hemi thorax. No interval change or superimposed acute process   Electronically Signed   By: Daryll Brod M.D.   On: 05/13/2013 10:01    Scheduled Meds: . aspirin EC  81 mg Oral QHS  . calcium acetate  1,334 mg Oral TID WC  . carvedilol  3.125 mg Oral BID WC  . ceFEPime (MAXIPIME) IV  2 g Intravenous Q M,W,F-HD  . cinacalcet  30 mg Oral QPM  . doxercalciferol  1 mcg Intravenous Q M,W,F-HD  . heparin  5,000 Units Subcutaneous 3 times per day  . isosorbide mononitrate  60 mg Oral Daily  . multivitamin  1 tablet Oral QHS  . simvastatin  10 mg Oral QHS  . thiamine  100 mg Oral QHS  . vancomycin  750 mg Intravenous Q M,W,F-HD   Continuous Infusions: . sodium chloride 50 mL/hr at 05/13/13 1824       Time spent: 35 minutes    [facility] A  [facility] Pager [phone] If 7PM-7AM, please contact night-coverage at [email or web address], password [facility] 05/14/2013, 12:08 PM  LOS: 1 day

## 2013-05-15 DIAGNOSIS — I498 Other specified cardiac arrhythmias: Secondary | ICD-10-CM

## 2013-05-15 DIAGNOSIS — R630 Anorexia: Secondary | ICD-10-CM

## 2013-05-15 DIAGNOSIS — J101 Influenza due to other identified influenza virus with other respiratory manifestations: Secondary | ICD-10-CM | POA: Diagnosis present

## 2013-05-15 DIAGNOSIS — J189 Pneumonia, unspecified organism: Secondary | ICD-10-CM

## 2013-05-15 LAB — CBC
HCT: 28.9 % — ABNORMAL LOW (ref 39.0–52.0)
HEMOGLOBIN: 9.7 g/dL — AB (ref 13.0–17.0)
MCH: 33.4 pg (ref 26.0–34.0)
MCHC: 33.6 g/dL (ref 30.0–36.0)
MCV: 99.7 fL (ref 78.0–100.0)
PLATELETS: 142 10*3/uL — AB (ref 150–400)
RBC: 2.9 MIL/uL — AB (ref 4.22–5.81)
RDW: 17.1 % — ABNORMAL HIGH (ref 11.5–15.5)
WBC: 7.3 10*3/uL (ref 4.0–10.5)

## 2013-05-15 LAB — BASIC METABOLIC PANEL
BUN: 29 mg/dL — ABNORMAL HIGH (ref 6–23)
CO2: 25 mEq/L (ref 19–32)
Calcium: 8.2 mg/dL — ABNORMAL LOW (ref 8.4–10.5)
Chloride: 95 mEq/L — ABNORMAL LOW (ref 96–112)
Creatinine, Ser: 3.61 mg/dL — ABNORMAL HIGH (ref 0.50–1.35)
GFR calc Af Amer: 16 mL/min — ABNORMAL LOW (ref >90)
GFR calc non Af Amer: 13 mL/min — ABNORMAL LOW (ref >90)
GLUCOSE: 93 mg/dL (ref 70–99)
POTASSIUM: 4.1 meq/L (ref 3.7–5.3)
Sodium: 137 mEq/L (ref 137–147)

## 2013-05-15 MED ORDER — AMOXICILLIN-POT CLAVULANATE 875-125 MG PO TABS
1.0000 | ORAL_TABLET | Freq: Two times a day (BID) | ORAL | Status: DC
  Filled 2013-05-15 (×2): qty 1

## 2013-05-15 MED ORDER — AMOXICILLIN-POT CLAVULANATE 500-125 MG PO TABS
1.0000 | ORAL_TABLET | Freq: Every day | ORAL | Status: DC
Start: 2013-05-15 — End: 2013-05-19
  Administered 2013-05-16 – 2013-05-18 (×3): 500 mg via ORAL
  Filled 2013-05-15 (×7): qty 1

## 2013-05-15 NOTE — Care Management Note (Signed)
    Page 1 of 1   05/15/2013     12:30:39 PM   CARE MANAGEMENT NOTE 05/15/2013  Patient:  Logan [name], Logan [name]   Account Number:  [id number]  Date Initiated:  05/15/2013  Documentation initiated by:  GRAVES-BIGELOW,Carmalita Wakefield  Subjective/Objective Assessment:   Pt is from [place]. Admitted for acute bronchitis. Plan for SNF when medically stable for d/c.     Action/Plan:   CSW will continue to assit with disposition needs.   Anticipated DC Date:  05/17/2013   Anticipated DC Plan:  SKILLED NURSING FACILITY  In-house referral  Clinical Social Worker      DC Planning Services  CM consult      Choice offered to / List presented to:             Status of service:  Completed, signed off Medicare Important Message given?   (If response is "NO", the following Medicare IM given date fields will be blank) Date Medicare IM given:   Date Additional Medicare IM given:    Discharge Disposition:  [place]  Per UR Regulation:  Reviewed for med. necessity/level of care/duration of stay  If discussed at [place] of Stay Meetings, dates discussed:    Comments:

## 2013-05-15 NOTE — Progress Notes (Addendum)
At 2115 pt fell out of the bed. Prior to the fall, the pt was alert to self and place only, was lethargic and weak.  Pt had weak grip, slightly asymmetrical but brisk pupils, resistant to opening eyes, lethargic but easily aroused.  Pt landed on his bottom and did not appear to have hit his head and was sitting up on the floor and holding onto the bed.  The pt's bed alarm was on prior to the fall and the bed alarm is what signaled staff to enter the room. When asked why he tried to get out of bed, he just repeated the question but never gave an answer. He was calm and cooperative.  The pt was placed back into bed, no injury seen on exam, pt did not complain of pain, neuro check and rest of exam same as shift exam and vitals were stable 97.20F, 176/68, HR 71, Respirations 20, SpO2 98 on room air.  MD on call, Dr. Hilbert Bible, notified at 2132 with no further orders at this time.  Pt in bed with bed alarm on the lowest setting, door open, room near nurses station, frequent contacts and toileting. Will continue to monitor, perform neuro checks and vitals, and notify MD of any changes in pt status.  Attempted to notify family.  The phones went to voicemail and the mailbox was full.

## 2013-05-15 NOTE — Progress Notes (Addendum)
   [facility] Progress Note   Subjective: Pt remains extremely confused  Filed Vitals:   05/14/13 2250 05/15/13 0010 05/15/13 0643 05/15/13 1236  BP: 152/66 138/62 125/62 128/42  Pulse: 69 75 76 65  Temp: 98.1 F (36.7 C) 98.4 F (36.9 C) 99.7 F (37.6 C) 98.2 F (36.8 C)  TempSrc: Oral Oral Oral Axillary  Resp: 27 24 22 21   Weight: 65.6 kg (144 lb 10 oz)     SpO2: 94% 100% 100% 95%  Exam: Elderly WM, confused, not in distress No jvd Chest clear bilat RRR 2/6 SEM Abd soft, nt, obese, no mass No LE edema, L AKA Neuro- is confused, nonfocal, lethargic   Dialysis: MWF [facility] 4h   68kg   2K/2.25 [place]  Heparin 7500   R IJ cath  Hectorol 1     Epo 6000     Venofer 100 mg x 5 thru 3/2   Assessment: 1 HCAP / flu-  po augmentin and tamiflu 2 ESRD on hemodialysis 3 Dementia / hx CVA- lives at [facility] 4 Anemia, cont esa 5 2HPT- no chg in Rx 6 HTN/volume- BP ok, lowdose coreg, well below dry wt 7 DNR- not sure of baseline but very poor MS now; if doesn't improve will need to consider palliative care eval for [place]- HD Osborne Oman MD  pager [phone]    cell [phone]  05/15/2013, 2:21 PM     Recent Labs Lab 05/13/13 0932 05/14/13 0535 05/15/13 0435  NA 141 139 137  K 5.0 5.4* 4.1  CL 95* 98 95*  CO2 26 21 25   GLUCOSE 92 81 93  BUN 65* 78* 29*  CREATININE 5.93* 7.08* 3.61*  CALCIUM 9.2 8.1* 8.2*    Recent Labs Lab 05/13/13 0932 05/14/13 0535  AST 68* 58*  ALT 32 30  ALKPHOS 109 91  BILITOT 0.3 0.2*  PROT 6.3 5.5*  ALBUMIN 3.0* 2.6*    Recent Labs Lab 05/13/13 0932 05/14/13 0535 05/15/13 0435  WBC 9.2 6.9 7.3  NEUTROABS 6.7  --   --   HGB 10.0* 8.5* 9.7*  HCT 31.3* 26.7* 28.9*  MCV 101.6* 100.4* 99.7  PLT 163 149* 142*   . amoxicillin-clavulanate  1 tablet Oral q1800  . aspirin EC  81 mg Oral QHS  . calcium acetate  1,334 mg Oral TID WC  . carvedilol  3.125 mg Oral BID WC  . cinacalcet  30 mg  Oral QPM  . darbepoetin  60 mcg Intravenous Q7 days  . doxercalciferol  1 mcg Intravenous Q M,W,F-HD  . feeding supplement (NEPRO CARB STEADY)  237 mL Oral BID BM  . ferric gluconate (FERRLECIT/NULECIT) IV  125 mg Intravenous Q M,W,F-HD  . heparin  5,000 Units Subcutaneous 3 times per day  . isosorbide mononitrate  60 mg Oral Daily  . multivitamin  1 tablet Oral QHS  . oseltamivir  30 mg Oral Q M,W,F-1800  . simvastatin  10 mg Oral QHS  . thiamine  100 mg Oral QHS     acetaminophen, acetaminophen, carisoprodol, ondansetron (ZOFRAN) IV, ondansetron

## 2013-05-15 NOTE — Progress Notes (Signed)
[facility] PROGRESS NOTE   Logan [name] FBP:[id number] DOB: January 19, 1920 DOA: 05/13/2013 PCP: Blanchie Serve, MD  HPI/Subjective: No changes clinically since yesterday, denies shortness of breath, fever chills. Does have some cough with minimal sputum production.  Assessment/Plan: Active Problems:   ESRD on dialysis   Vascular dementia   Hypertension   Upper respiratory infection   Diarrhea   Cough   Metabolic encephalopathy   Acute bronchitis   Acute bronchitis -Patient has cough with upper respiratory symptoms so started on vancomycin and cefepime. -Currently is being treated for acute bronchitis/early HCAP/aspiration. -Switch antibiotics to Augmentin. -Had bronchodilators, mucolytics, oxygen as needed and antitussives.  Influenza A -Patient is positive for type A. influenza, started on Tamiflu. -We'll de-escalate antibiotics, will discontinue cefepime/vancomycin start Augmentin would be Tamiflu.  ESRD -Nephrology consulted routine hemodialysis. -Patient gets dialysis on Monday, Wednesday and Friday.  Hypertension -Home medications continued.  Diarrhea -Patient reported to have 2-4 bowel movements per day, he was recently started on Levaquin. -C. difficile PCR was checked and it was negative   Code Status: DO NOT RESUSCITATE Family Communication: Plan discussed with the patient. Disposition Plan: Remains inpatient, PT/OT to evaluate and treat likely he will need placement   Consultants:  Nephrology  Procedures:  Hemodialysis  Antibiotics:  Vancomycin and cefepime.   Objective: Filed Vitals:   05/15/13 0643  BP: 125/62  Pulse: 76  Temp: 99.7 F (37.6 C)  Resp: 22    Intake/Output Summary (Last 24 hours) at 05/15/13 1229 Last data filed at 05/14/13 2250  Gross per 24 hour  Intake    140 ml  Output   2000 ml  Net  -1860 ml   Filed Weights   05/14/13 1910 05/14/13 2250  Weight: 67.4 kg (148 lb 9.4 oz) 65.6 kg (144 lb 10 oz)     Exam: General: Alert and awake, oriented x3, not in any acute distress. HEENT: anicteric sclera, pupils reactive to light and accommodation, EOMI CVS: S1-S2 clear, no murmur rubs or gallops Chest: clear to auscultation bilaterally, no wheezing, rales or rhonchi Abdomen: soft nontender, nondistended, normal bowel sounds, no organomegaly Extremities: no cyanosis, clubbing or edema noted bilaterally Neuro: Cranial nerves II-XII intact, no focal neurological deficits  Data Reviewed: Basic Metabolic Panel:  Recent Labs Lab 05/13/13 0932 05/14/13 0535 05/15/13 0435  NA 141 139 137  K 5.0 5.4* 4.1  CL 95* 98 95*  CO2 26 21 25   GLUCOSE 92 81 93  BUN 65* 78* 29*  CREATININE 5.93* 7.08* 3.61*  CALCIUM 9.2 8.1* 8.2*   Liver Function Tests:  Recent Labs Lab 05/13/13 0932 05/14/13 0535  AST 68* 58*  ALT 32 30  ALKPHOS 109 91  BILITOT 0.3 0.2*  PROT 6.3 5.5*  ALBUMIN 3.0* 2.6*   No results found for this basename: LIPASE, AMYLASE,  in the last 168 hours No results found for this basename: AMMONIA,  in the last 168 hours CBC:  Recent Labs Lab 05/13/13 0932 05/14/13 0535 05/15/13 0435  WBC 9.2 6.9 7.3  NEUTROABS 6.7  --   --   HGB 10.0* 8.5* 9.7*  HCT 31.3* 26.7* 28.9*  MCV 101.6* 100.4* 99.7  PLT 163 149* 142*   Cardiac Enzymes:  Recent Labs Lab 05/13/13 1029  TROPONINI <0.30   BNP (last 3 results) No results found for this basename: PROBNP,  in the last 8760 hours CBG: No results found for this basename: GLUCAP,  in the last 168 hours  Micro Recent Results (from the  past 240 hour(s))  CULTURE, EXPECTORATED SPUTUM-ASSESSMENT     Status: None   Collection Time    05/13/13  8:48 PM      Result Value Ref Range Status   Specimen Description SPUTUM   Final   Special Requests NONE   Final   Sputum evaluation     Final   Value: MICROSCOPIC FINDINGS SUGGEST THAT THIS SPECIMEN IS NOT REPRESENTATIVE OF LOWER RESPIRATORY SECRETIONS. PLEASE RECOLLECT.      RESULT CALLED TO, READ BACK BY AND VERIFIED WITHLannie Fields RN [phone] GREEN R   Report Status 05/13/2013 FINAL   Final     Studies: No results found.  Scheduled Meds: . aspirin EC  81 mg Oral QHS  . calcium acetate  1,334 mg Oral TID WC  . carvedilol  3.125 mg Oral BID WC  . ceFEPime (MAXIPIME) IV  2 g Intravenous Q M,W,F-HD  . cinacalcet  30 mg Oral QPM  . darbepoetin  60 mcg Intravenous Q7 days  . doxercalciferol  1 mcg Intravenous Q M,W,F-HD  . feeding supplement (NEPRO CARB STEADY)  237 mL Oral BID BM  . ferric gluconate (FERRLECIT/NULECIT) IV  125 mg Intravenous Q M,W,F-HD  . heparin  5,000 Units Subcutaneous 3 times per day  . isosorbide mononitrate  60 mg Oral Daily  . multivitamin  1 tablet Oral QHS  . oseltamivir  30 mg Oral Q M,W,F-1800  . simvastatin  10 mg Oral QHS  . thiamine  100 mg Oral QHS  . vancomycin  750 mg Intravenous Q M,W,F-HD   Continuous Infusions:       Time spent: 35 minutes    [facility] A  [facility] Pager [phone] If 7PM-7AM, please contact night-coverage at [email or web address], password [facility] 05/15/2013, 12:29 PM  LOS: 2 days

## 2013-05-15 NOTE — Evaluation (Addendum)
Clinical/Bedside Swallow Evaluation Patient Details  Name: Logan [name] MRN: [id number] Date of Birth: 04/17/1919  Today's Date: 05/15/2013 Time: 0803-0829 SLP Time Calculation (min): 26 min  Past Medical History:  Past Medical History  Diagnosis Date  . Hypertension   . ESRD on dialysis     MONDAY, [place], and FRIDAY  . Stroke 2005  . Anemia   . Secondary hyperparathyroidism   . Peripheral vascular disease, unspecified   . Basal cell carcinoma   . BPH (benign prostatic hypertrophy)   . History of Clostridium difficile infection   . Cataract   . Abnormal nuclear stress test 2005    Mild distal anterior ischemia on nuclear test  . Dementia   . Sleep apnea   . H/O hiatal hernia    Past Surgical History:  Past Surgical History  Procedure Laterality Date  . Insertion of dialysis catheter  09/27/2010    Left IJ Diatek Catheter  . Above knee leg amputation  12/26/2004    Left AKA by Dr. Oneida Alar  . Tonsillectomy and adenoidectomy    . Thrombectomy / arteriovenous graft revision      Numerous revision and declots  . Femoral bypass  2005    Left Femoral to posterior tibial BPG w/ composite PTFE and vein  . Hernia repair    . Appendectomy    . Cataract extraction      Left eye   . Arteriovenous graft placement     HPI:  80 yo male adm to [facility] with dehydration.  PMH + for ESRD on dialysis, dementia, cva, diarrhea, cough/bronchitis. Swallow evaluation ordered.  Pt CT head negative for acute change shows chronic parietal cva.  CXR negative for acute process.     Assessment / Plan / Recommendation Clinical Impression  Pt with mild oral dysphagia - suspect pharyngeal swallow intact but can not rule out silent aspiration at bedside.  Pt observed with breakfast - self feeding as best able due to motor deficits.  Delayed oral transiting with decreased coordination noted and mild oral residuals with decreased awareness.  Pt did eventually clear oral cavity of food independently  -although impulsively- but he tends to continue to place food in mouth prior to clearing.  Overt coughing with thin via cup - suspect due to premature spillage into pharynx/larynx.  Much better tolerance of thin via tsp and straw - Please use cup with handle and lid/straw for increased ease of self feeding.   Pt does become mildly short of breath with effort but he is able to feed himself which will enhance his airway protection.  Skilled intervention included educating pt to recommendations and determining effective compensation strategies.  Recommend to continue dys3/thin diet with supervision.      Aspiration Risk  Moderate    Diet Recommendation Dysphagia 3 (Mechanical Soft);Thin liquid   Liquid Administration via: Straw;Spoon Medication Administration: Whole meds with puree Supervision: Intermittent supervision to cue for compensatory strategies;Patient able to self feed Compensations: Slow rate;Small sips/bites;Check for pocketing Postural Changes and/or Swallow Maneuvers: Seated upright 90 degrees;Upright 30-60 min after meal    Other  Recommendations Recommended Consults: OT self-feeding Oral Care Recommendations: Oral care BID   Follow Up Recommendations  24 hour supervision/assistance    Frequency and Duration min 2x/week  1 week   Pertinent Vitals/Pain Afebrile, decreased     General Date of Onset: 05/15/13 HPI: 80 yo male adm to [facility] with dehydration.  PMH + for ESRD on dialysis, dementia, diarrhea, cough/bronchitis. Swallow evaluation ordered.  Pt CT head negative for acute change shows chronic parietal cva.  CXR negative for acute process.   Type of Study: Bedside swallow evaluation Diet Prior to this Study: Dysphagia 3 (soft);Thin liquids Temperature Spikes Noted: No Respiratory Status: Nasal cannula History of Recent Intubation: No Behavior/Cognition: Alert;Impulsive;Distractible;Doesn't follow directions Oral Cavity - Dentition: Adequate natural  dentition Self-Feeding Abilities: Able to feed self (requires effort to feed self, ? some spastic arm movement) Patient Positioning: Upright in bed Baseline Vocal Quality: Hoarse;Breathy Volitional Cough: Weak Volitional Swallow: Unable to elicit    Oral/Motor/Sensory Function Overall Oral Motor/Sensory Function: Appears within functional limits for tasks assessed (no focal cn deficits, decreased participation in oral motor exam from pt)   Ice Chips Ice chips: Not tested   Thin Liquid Thin Liquid: Impaired Presentation: Straw;Spoon;Cup;Self Fed Oral Phase Impairments: Reduced lingual movement/coordination;Impaired anterior to posterior transit;Reduced labial seal Oral Phase Functional Implications: Prolonged oral transit Pharyngeal  Phase Impairments: Cough - Immediate Other Comments: cough with attempts for pt to consume from cup - presume premature spillage into pharynx, tsps and straw sips tolerated without cough or indications of airway compromise    Nectar Thick Nectar Thick Liquid: Impaired Presentation: Cup;Straw Oral Phase Impairments: Impaired anterior to posterior transit;Reduced lingual movement/coordination;Impaired mastication Oral phase functional implications: Prolonged oral transit Pharyngeal Phase Impairments: Suspected delayed Swallow   Honey Thick Honey Thick Liquid: Not tested   Puree Puree: Impaired Presentation: Self Fed;Spoon Oral Phase Impairments: Reduced labial seal;Reduced lingual movement/coordination;Impaired anterior to posterior transit Oral Phase Functional Implications: Prolonged oral transit;Oral residue Pharyngeal Phase Impairments: Suspected delayed Swallow   Solid   GO    Solid: Impaired Presentation: Self Fed Oral Phase Impairments: Reduced lingual movement/coordination;Reduced labial seal;Impaired anterior to posterior transit Oral Phase Functional Implications: Oral residue Pharyngeal Phase Impairments: Suspected delayed Elonda Husky, [place] [facility] SLP [phone]

## 2013-05-15 NOTE — Progress Notes (Signed)
UR Completed Orvil Faraone Graves-Bigelow, RN,BSN [phone]  

## 2013-05-16 LAB — RENAL FUNCTION PANEL
ALBUMIN: 2.4 g/dL — AB (ref 3.5–5.2)
BUN: 56 mg/dL — AB (ref 6–23)
CHLORIDE: 96 meq/L (ref 96–112)
CO2: 25 mEq/L (ref 19–32)
Calcium: 8.4 mg/dL (ref 8.4–10.5)
Creatinine, Ser: 6.49 mg/dL — ABNORMAL HIGH (ref 0.50–1.35)
GFR calc Af Amer: 8 mL/min — ABNORMAL LOW (ref >90)
GFR calc non Af Amer: 7 mL/min — ABNORMAL LOW (ref >90)
Glucose, Bld: 158 mg/dL — ABNORMAL HIGH (ref 70–99)
Phosphorus: 4.7 mg/dL — ABNORMAL HIGH (ref 2.3–4.6)
Potassium: 4.3 mEq/L (ref 3.7–5.3)
Sodium: 139 mEq/L (ref 137–147)

## 2013-05-16 LAB — CBC
HEMATOCRIT: 26.3 % — AB (ref 39.0–52.0)
Hemoglobin: 8.7 g/dL — ABNORMAL LOW (ref 13.0–17.0)
MCH: 32.6 pg (ref 26.0–34.0)
MCHC: 33.1 g/dL (ref 30.0–36.0)
MCV: 98.5 fL (ref 78.0–100.0)
Platelets: 162 10*3/uL (ref 150–400)
RBC: 2.67 MIL/uL — AB (ref 4.22–5.81)
RDW: 16.9 % — ABNORMAL HIGH (ref 11.5–15.5)
WBC: 7.2 10*3/uL (ref 4.0–10.5)

## 2013-05-16 MED ORDER — HEPARIN SODIUM (PORCINE) 1000 UNIT/ML DIALYSIS
7500.0000 [IU] | Freq: Once | INTRAMUSCULAR | Status: AC
  Administered 2013-05-16: 7500 [IU] via INTRAVENOUS_CENTRAL

## 2013-05-16 MED ORDER — LIDOCAINE-PRILOCAINE 2.5-2.5 % EX CREA: 1.0000 "application " | TOPICAL_CREAM | CUTANEOUS | Status: DC | PRN

## 2013-05-16 MED ORDER — LIDOCAINE HCL (PF) 1 % IJ SOLN: 5.0000 mL | INTRAMUSCULAR | Status: DC | PRN

## 2013-05-16 MED ORDER — ALTEPLASE 2 MG IJ SOLR: 2.0000 mg | Freq: Once | INTRAMUSCULAR | Status: AC | PRN

## 2013-05-16 MED ORDER — HEPARIN SODIUM (PORCINE) 1000 UNIT/ML DIALYSIS: 7500.0000 [IU] | Freq: Once | INTRAMUSCULAR | Status: DC

## 2013-05-16 MED ORDER — SODIUM CHLORIDE 0.9 % IV SOLN: 100.0000 mL | INTRAVENOUS | Status: DC | PRN

## 2013-05-16 MED ORDER — HEPARIN SODIUM (PORCINE) 1000 UNIT/ML DIALYSIS: 1000.0000 [IU] | INTRAMUSCULAR | Status: DC | PRN

## 2013-05-16 MED ORDER — PENTAFLUOROPROP-TETRAFLUOROETH EX AERO: 1.0000 "application " | INHALATION_SPRAY | CUTANEOUS | Status: DC | PRN

## 2013-05-16 MED ORDER — NEPRO/CARBSTEADY PO LIQD: 237.0000 mL | ORAL | Status: DC | PRN

## 2013-05-16 MED ORDER — DOXERCALCIFEROL 4 MCG/2ML IV SOLN
INTRAVENOUS | Status: AC
  Filled 2013-05-16: qty 2

## 2013-05-16 NOTE — Progress Notes (Signed)
Patient ID: Logan [name]  male  ONG:[id number]    DOB: [birth year]    DOA: 05/13/2013  PCP: Blanchie Serve, MD  Assessment/Plan:  Acute bronchitis/ HCAP/aspiration, Influenza A -Patient has cough with upper respiratory symptoms so was initially started on vancomycin and cefepime, transitioned to oral Augmentin on 02/24 -Had bronchodilators, mucolytics, oxygen as needed and antitussives.   Influenza A  -Patient is positive for type A. influenza, started on Tamiflu.  -cont Augmentin and Tamiflu.  Acute encephalopathy - Unclear of the baseline, also had a fall last night, no injury was seen, vital signs stable -CT head on admission to 22 head showed sinusitis with air-fluid levels, atrophy and chronic left parietal infarct    ESRD  -Nephrology consulted routine hemodialysis.  -Patient gets dialysis on Monday, Wednesday and Friday.   Hypertension  -Home medications continued.   Diarrhea  -Patient reported to have 2-4 bowel movements per day, he was recently started on Levaquin.  -C. difficile PCR was checked and it was negative    DVT Prophylaxis:  Code Status:  Family Communication:  Disposition:  Consultants:  Nephrology  Procedures:   CT head   Antibiotics:  Vancomycin, cefepime, discontinued on 05/15/13  Augmentin  Tamiflu  Subjective: Patient seen and examined, somewhat lethargic this morning, not appropriately answering  Objective: Weight change: -2 kg (-4 lb 6.6 oz)  Intake/Output Summary (Last 24 hours) at 05/16/13 1405 Last data filed at 05/16/13 0520  Gross per 24 hour  Intake    240 ml  Output      0 ml  Net    240 ml   Blood pressure 149/61, pulse 66, temperature 97.4 F (36.3 C), temperature source Axillary, resp. rate 20, weight 65.4 kg (144 lb 2.9 oz), SpO2 97.00%.  Physical Exam: General: lethargic, not in any acute distress. CVS: S1-S2 clear, 2/6 SEM Chest: clear to auscultation bilaterally, no wheezing, rales or  rhonchi Abdomen: soft nontender, nondistended, normal bowel sounds  Extremities: no cyanosis, clubbing or edema noted bilaterally Neuro: Moves all 4 extremities spontaneously  Lab Results: Basic Metabolic Panel:  Recent Labs Lab 05/14/13 0535 05/15/13 0435  NA 139 137  K 5.4* 4.1  CL 98 95*  CO2 21 25  GLUCOSE 81 93  BUN 78* 29*  CREATININE 7.08* 3.61*  CALCIUM 8.1* 8.2*   Liver Function Tests:  Recent Labs Lab 05/13/13 0932 05/14/13 0535  AST 68* 58*  ALT 32 30  ALKPHOS 109 91  BILITOT 0.3 0.2*  PROT 6.3 5.5*  ALBUMIN 3.0* 2.6*   No results found for this basename: LIPASE, AMYLASE,  in the last 168 hours No results found for this basename: AMMONIA,  in the last 168 hours CBC:  Recent Labs Lab 05/13/13 0932 05/14/13 0535 05/15/13 0435  WBC 9.2 6.9 7.3  NEUTROABS 6.7  --   --   HGB 10.0* 8.5* 9.7*  HCT 31.3* 26.7* 28.9*  MCV 101.6* 100.4* 99.7  PLT 163 149* 142*   Cardiac Enzymes:  Recent Labs Lab 05/13/13 1029  TROPONINI <0.30   BNP: No components found with this basename: POCBNP,  CBG: No results found for this basename: GLUCAP,  in the last 168 hours   Micro Results: Recent Results (from the past 240 hour(s))  CULTURE, EXPECTORATED SPUTUM-ASSESSMENT     Status: None   Collection Time    05/13/13  8:48 PM      Result Value Ref Range Status   Specimen Description SPUTUM   Final   Special  Requests NONE   Final   Sputum evaluation     Final   Value: MICROSCOPIC FINDINGS SUGGEST THAT THIS SPECIMEN IS NOT REPRESENTATIVE OF LOWER RESPIRATORY SECRETIONS. PLEASE RECOLLECT.     RESULT CALLED TO, READ BACK BY AND VERIFIED WITHLannie Fields RN [phone] GREEN R   Report Status 05/13/2013 FINAL   Final    Studies/Results: Ct Head Wo Contrast  05/13/2013   CLINICAL DATA:  Decreased level of consciousness.  EXAM: CT HEAD WITHOUT CONTRAST  TECHNIQUE: Contiguous axial images were obtained from the base of the skull through the vertex without  intravenous contrast.  COMPARISON:  None.  FINDINGS: Moderate atrophy. Chronic infarct in the left parietal lobe. Negative for acute infarct. Negative for hemorrhage or mass lesion.  Air-fluid levels in the sphenoid sinus. Mucosal edema in the ethmoid sinuses. Small air-fluid levels in the frontal sinuses. No acute bony lesion.  IMPRESSION: Atrophy and chronic left parietal infarct. No acute intracranial abnormality.  Sinusitis with air-fluid levels.   Electronically Signed   By: Franchot Gallo M.D.   On: 05/13/2013 10:07   Dg Chest Port 1 View  05/13/2013   CLINICAL DATA:  Cough  EXAM: PORTABLE CHEST - 1 VIEW  COMPARISON:  03/27/2013  FINDINGS: Stable right IJ dialysis catheter tips at the SVC RA junction. Cardiomegaly evident with a a chronic right effusion versus pleural thickening. Right base atelectasis versus scarring noted. Stable left lung aeration. No pneumothorax or superimposed acute process. Atherosclerosis of the aorta.  IMPRESSION: Stable cardiomegaly and chronic findings of the right hemi thorax. No interval change or superimposed acute process   Electronically Signed   By: Daryll Brod M.D.   On: 05/13/2013 10:01    Medications: Scheduled Meds: . amoxicillin-clavulanate  1 tablet Oral q1800  . aspirin EC  81 mg Oral QHS  . calcium acetate  1,334 mg Oral TID WC  . carvedilol  3.125 mg Oral BID WC  . cinacalcet  30 mg Oral QPM  . darbepoetin  60 mcg Intravenous Q7 days  . doxercalciferol  1 mcg Intravenous Q M,W,F-HD  . feeding supplement (NEPRO CARB STEADY)  237 mL Oral BID BM  . ferric gluconate (FERRLECIT/NULECIT) IV  125 mg Intravenous Q M,W,F-HD  . heparin  5,000 Units Subcutaneous 3 times per day  . heparin  7,500 Units Dialysis Once in dialysis  . [START ON 05/17/2013] heparin  7,500 Units Dialysis Once in dialysis  . isosorbide mononitrate  60 mg Oral Daily  . multivitamin  1 tablet Oral QHS  . oseltamivir  30 mg Oral Q M,W,F-1800  . simvastatin  10 mg Oral QHS  .  thiamine  100 mg Oral QHS      LOS: 3 days   RAI,RIPUDEEP M.D. [facility] 05/16/2013, 2:05 PM Pager: [phone]  If 7PM-7AM, please contact night-coverage [email or web address] Password [email or web address]

## 2013-05-16 NOTE — Evaluation (Signed)
Occupational Therapy Evaluation Patient Details Name: Logan [name] MRN: [id number] DOB: [birth year] Today's Date: 05/16/2013 Time: 5993-5701 OT Time Calculation (min): 27 min  OT Assessment / Plan / Recommendation History of present illness 80 y.o. male admitted to [facility] on 05/13/13 with decreased LOC and upper resp infection. He receives HD MWF @ [facility], history of HTN, stroke, dementia, PVD. He was started on levaquin by his PCP 2 days ago and per his family started having diarrhea and became difficult to arouse.  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  PT + for flu   Clinical Impression   Pt admitted with above diagnosis and now has deficits listed below. Pt would benefit from cont OT to attempt to reach baseline level of functioning.  Pt will need SNF before returning to I living with his wife.      OT Assessment  Patient needs continued OT Services    Follow Up Recommendations  SNF;Supervision/Assistance - 24 hour    Barriers to Discharge Decreased caregiver support pt was in I living PTA  Equipment Recommendations  Other (comment) (to be assessed)    Recommendations for Other Services    Frequency  Min 2X/week    Precautions / Restrictions Precautions Precautions: Fall Restrictions Weight Bearing Restrictions: No   Pertinent Vitals/Pain Vitals stable.    ADL  Eating/Feeding: Performed;Moderate assistance;Other (comment) (difficulty staying awake to be able to feed self) Where Assessed - Eating/Feeding: Bed level Grooming: Performed;Wash/dry hands;Brushing hair;Moderate assistance Where Assessed - Grooming: Supported sitting Upper Body Bathing: Performed;Moderate assistance Where Assessed - Upper Body Bathing: Supported sitting Lower Body Bathing: Simulated;Maximal assistance Where Assessed - Lower Body Bathing: Rolling right and/or left Upper Body Dressing: Simulated;Maximal assistance Where Assessed - Upper Body Dressing: Supported sitting Lower Body Dressing: Performed;+1  Total assistance Where Assessed - Lower Body Dressing: Rolling right and/or left Transfers/Ambulation Related to ADLs: Pt transferred supine to sit with moderate assist but was unable to hold self on side of bed due to dereased balance and lethargy.   Despite several cues, pt unable to stay awake long enough to attempt to stand. ADL Comments: Pt too lethargic to participate in too many adls at this time.    OT Diagnosis: Generalized weakness;Cognitive deficits  OT Problem List: Decreased strength;Decreased activity tolerance;Impaired balance (sitting and/or standing);Impaired vision/perception;Decreased safety awareness;Decreased knowledge of use of DME or AE OT Treatment Interventions: Self-care/ADL training;Therapeutic activities;DME and/or AE instruction   OT Goals(Current goals can be found in the care plan section) Acute Rehab OT Goals Patient Stated Goal: none stated due to lethargy OT Goal Formulation: Patient unable to participate in goal setting Time For Goal Achievement: 05/30/13 Potential to Achieve Goals: Fair ADL Goals Pt Will Perform Grooming: with set-up;sitting Pt Will Perform Upper Body Bathing: with set-up;sitting Pt Will Perform Upper Body Dressing: with set-up;sitting Pt Will Transfer to Toilet: with mod assist;with transfer board;bedside commode Additional ADL Goal #1: Pt will keep eyes open and attend to 100% of session w/o cues to increase participation.  Visit Information  Last OT Received On: 05/16/13 Assistance Needed: +2 History of Present Illness: 80 y.o. male admitted to [facility] on 05/13/13 with decreased LOC and upper resp infection. He receives HD MWF @ [facility], history of HTN, stroke, dementia, PVD. He was started on levaquin by his PCP 2 days ago and per his family started having diarrhea and became difficult to arouse.  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  PT + for flu  Prior [place] expects to be discharged to:: Skilled  nursing facility Living Arrangements: Spouse/significant other Additional Comments: Wife very [place] and seems confused as well.   Prior Function Level of Independence: Needs assistance Gait / Transfers Assistance Needed: uses sliding board with assist, gets assistance to push manual w/c ADL's / Homemaking Assistance Needed: has assist from wife.  unsure what adls he did on his own.  Wife not available. Comments: Wife unable to relay accurate info, so unsure of his premorbid level of mobility.   Communication Communication: HOH         Vision/Perception Vision - History Baseline Vision: Other (comment) (pt would not keep eyes open for exam) Vision - Assessment Vision Assessment: Vision not tested   Cognition  Cognition Arousal/Alertness: Lethargic Behavior During Therapy:  (lethargic) Overall Cognitive Status: Impaired/Different from baseline Area of Impairment: Attention;Memory;Following commands;Safety/judgement;Problem solving Current Attention Level: Focused Memory: Decreased recall of precautions Following Commands: Follows one step commands inconsistently Safety/Judgement: Decreased awareness of safety;Decreased awareness of deficits Problem Solving: Slow processing;Decreased initiation General Comments: It is difficult to really assess his cognition as he needed constant cues to stay awake and follow one step commands.      Extremity/Trunk Assessment Upper Extremity Assessment Upper Extremity Assessment: Generalized weakness Lower Extremity Assessment Lower Extremity Assessment: Defer to PT evaluation Cervical / Trunk Assessment Cervical / Trunk Assessment: Normal     Mobility Bed Mobility Overal bed mobility: Needs Assistance Bed Mobility: Rolling Rolling: Min assist General bed mobility comments: Min assist to get trunk in fully sidelying position verbal and manual cues to use railing for support.  Pt with cues and assist to find the railing was also able to scoot  up to [facility] with mod assist and bed in maximal trendelenberg.   Transfers Overall transfer level:  (pt too lethargic to sit.) Equipment used: 1 person hand held assist General transfer comment: Pt too lethargic to transfer     Exercise     Balance Balance Overall balance assessment: Needs assistance Sitting-balance support: Bilateral upper extremity supported Sitting balance-Leahy Scale: Poor General Comments General comments (skin integrity, edema, etc.): Pt not following all commands due to falling asleep during eval but even when was awake seemed very confused.   End of Session OT - End of Session Equipment Utilized During Treatment: Oxygen Activity Tolerance: Patient limited by lethargy Patient left: in bed;with call bell/phone within reach Nurse Communication: Mobility status  GO     Glenford Peers 05/16/2013, 11:29 AM [phone]

## 2013-05-16 NOTE — Progress Notes (Signed)
Speech Language Pathology Treatment: Dysphagia  Patient Details Name: Logan [name] MRN: [id number] DOB: Dec 24, 1919 Today's Date: 05/16/2013 Time: 1100-1130 SLP Time Calculation (min): 30 min  Assessment / Plan / Recommendation Clinical Impression  Pt having difficulty maintaining alertness today.  Pt with open mouth breathing - significantly dry oral cavity noted.  Suction set up and oral care provided. Pt allowed oral care, but required significant cueing to open/close mouth.  Once oral care completed, thin liquid trials were attempted. Pt refused trials.  ST to continue to assess diet tolerance as pt able.   HPI HPI: 80 yo male adm to [facility] with dehydration.  PMH + for ESRD on dialysis, dementia, diarrhea, cough/bronchitis. Swallow evaluation ordered.  Pt CT head negative for acute change shows chronic parietal cva.  CXR negative for acute process.  BSE completed yesterday.   Pertinent Vitals VSS  SLP Plan  Continue with current plan of care    Recommendations Medication Administration: Whole meds with puree Supervision: Intermittent supervision to cue for compensatory strategies;Patient able to self feed Compensations: Slow rate;Small sips/bites;Check for pocketing Postural Changes and/or Swallow Maneuvers: Seated upright 90 degrees;Upright 30-60 min after meal              Oral Care Recommendations: Oral care BID Follow up Recommendations: 24 hour supervision/assistance Plan: Continue with current plan of care    [place] [facility], CCC-SLP [phone] Shonna Chock 05/16/2013, 12:56 PM

## 2013-05-17 ENCOUNTER — Ambulatory Visit: Payer: Medicare Other | Admitting: Nurse Practitioner

## 2013-05-17 ENCOUNTER — Inpatient Hospital Stay (HOSPITAL_COMMUNITY): Payer: Medicare Other

## 2013-05-17 MED ORDER — ASPIRIN 300 MG RE SUPP
300.0000 mg | Freq: Every day | RECTAL | Status: DC
  Filled 2013-05-17 (×3): qty 1

## 2013-05-17 MED ORDER — ASPIRIN 325 MG PO TABS
325.0000 mg | ORAL_TABLET | Freq: Every day | ORAL | Status: DC
  Administered 2013-05-17 – 2013-05-19 (×3): 325 mg via ORAL
  Filled 2013-05-17 (×3): qty 1

## 2013-05-17 NOTE — Progress Notes (Signed)
[facility] Progress Note   Subjective: Pt remains very confused  Filed Vitals:   05/16/13 2214 05/17/13 0515 05/17/13 0543 05/17/13 0700  BP: 147/56 105/49    Pulse: 71 62    Temp: 99.4 F (37.4 C) 97.9 F (36.6 C)    TempSrc: Oral Axillary    Resp: 18 18    Height:    5' 10.08" (1.78 m)  Weight:   60.6 kg (133 lb 9.6 oz)   SpO2: 98% 95%    Exam: Elderly WM, confused, not in distress No jvd Chest clear bilat RRR 2/6 SEM Abd soft, nt, obese, no mass No LE edema, L AKA Neuro- is confused, nonfocal, lethargic   Dialysis: MWF [facility] 4h   68kg   2K/2.25 Bath  Heparin 7500   R IJ cath  Hectorol 1     Epo 6000     Venofer 100 mg x 5 thru 3/2   Assessment: 1 HCAP / flu-  po augmentin and tamiflu 2 ESRD on hemodialysis 3 Dementia / hx CVA- lives at [facility] 4 Anemia, cont esa 5 2HPT- no chg in Rx 6 HTN/volume- BP ok, lowdose coreg, well below dry wt 7 DNR-  not sure of baseline but very poor MS now; if doesn't improve will need to consider palliative care eval for [place]- HD tomorrow    Kelly Splinter MD  pager [phone]    cell [phone]  05/16/2013, 10:00 AM     Recent Labs Lab 05/14/13 0535 05/15/13 0435 05/16/13 1557  NA 139 137 139  K 5.4* 4.1 4.3  CL 98 95* 96  CO2 21 25 25   GLUCOSE 81 93 158*  BUN 78* 29* 56*  CREATININE 7.08* 3.61* 6.49*  CALCIUM 8.1* 8.2* 8.4  PHOS  --   --  4.7*    Recent Labs Lab 05/13/13 0932 05/14/13 0535 05/16/13 1557  AST 68* 58*  --   ALT 32 30  --   ALKPHOS 109 91  --   BILITOT 0.3 0.2*  --   PROT 6.3 5.5*  --   ALBUMIN 3.0* 2.6* 2.4*    Recent Labs Lab 05/13/13 0932 05/14/13 0535 05/15/13 0435 05/16/13 1557  WBC 9.2 6.9 7.3 7.2  NEUTROABS 6.7  --   --   --   HGB 10.0* 8.5* 9.7* 8.7*  HCT 31.3* 26.7* 28.9* 26.3*  MCV 101.6* 100.4* 99.7 98.5  PLT 163 149* 142* 162   . amoxicillin-clavulanate  1 tablet Oral q1800  . aspirin EC  81 mg Oral QHS  . calcium acetate  1,334  mg Oral TID WC  . carvedilol  3.125 mg Oral BID WC  . cinacalcet  30 mg Oral QPM  . darbepoetin  60 mcg Intravenous Q7 days  . doxercalciferol  1 mcg Intravenous Q M,W,F-HD  . feeding supplement (NEPRO CARB STEADY)  237 mL Oral BID BM  . ferric gluconate (FERRLECIT/NULECIT) IV  125 mg Intravenous Q M,W,F-HD  . heparin  5,000 Units Subcutaneous 3 times per day  . heparin  7,500 Units Dialysis Once in dialysis  . isosorbide mononitrate  60 mg Oral Daily  . multivitamin  1 tablet Oral QHS  . oseltamivir  30 mg Oral Q M,W,F-1800  . simvastatin  10 mg Oral QHS  . thiamine  100 mg Oral QHS     sodium chloride, sodium chloride, sodium chloride, sodium chloride, acetaminophen, acetaminophen, carisoprodol, feeding supplement (NEPRO CARB STEADY), feeding supplement (NEPRO CARB STEADY),  heparin, heparin, lidocaine (PF), lidocaine (PF), lidocaine-prilocaine, lidocaine-prilocaine, ondansetron (ZOFRAN) IV, ondansetron, pentafluoroprop-tetrafluoroeth, pentafluoroprop-tetrafluoroeth

## 2013-05-17 NOTE — Progress Notes (Addendum)
Patient ID: Logan [name]  male  CBJ:[id number]    DOB: 03-06-1920    DOA: 05/13/2013  PCP: Blanchie Serve, MD  Addendum  MRI reviewed Suggestion of tiny acute infarct mid right coronal radiata.  - Will discuss with neurology, ?MRA brain  - currently on aspirin 81 mg daily, ? increased to 325 mg - He recently had an echocardiogram last month EF is 55-60%, mild aortic stenosis, no PFO mild-to-moderate TR  - Obtain carotid Doppler, neurochecks  - PT, OT evaluation  - Swallow evaluation already done, on dysphagia 3 diet    RAI,RIPUDEEP M.D. [facility] 05/17/2013, 3:20 PM  Pager: [phone]      Assessment/Plan:  Acute bronchitis/ HCAP/aspiration, Influenza A -Patient has cough with upper respiratory symptoms so was initially started on vancomycin and cefepime, transitioned to oral Augmentin on 02/24 -Had bronchodilators, mucolytics, oxygen as needed and antitussives.   Influenza A  -Patient is positive for type A. influenza, started on Tamiflu.  -cont Augmentin and Tamiflu.  Acute encephalopathy - Unclear of the baseline, CT head on admission showed sinusitis with air-fluid levels, atrophy and chronic left parietal infarct  - Still remains very confused, almost obtunded today, will check MRI of the brain, EEG   ESRD  -Nephrology consulted routine hemodialysis.  -Patient gets dialysis on Monday, Wednesday and Friday.   Hypertension  -Home medications continued.   Diarrhea  -Patient reported to have 2-4 bowel movements per day on admission, he was recently started on Levaquin.  -C. difficile PCR was checked and it was negative    DVT Prophylaxis:  Code Status:  Family Communication:  Disposition:  Consultants:  Nephrology  Procedures:   CT head   Antibiotics:  Vancomycin, cefepime, discontinued on 05/15/13  Augmentin  Tamiflu  Subjective: Patient seen and examined, almost obtunded, difficult to arouse  Objective: Weight change: -1.6  kg (-3 lb 8.4 oz)  Intake/Output Summary (Last 24 hours) at 05/17/13 1242 Last data filed at 05/17/13 0535  Gross per 24 hour  Intake     90 ml  Output   1500 ml  Net  -1410 ml   Blood pressure 143/61, pulse 66, temperature 97.9 F (36.6 C), temperature source Axillary, resp. rate 18, height 5' 10.08" (1.78 m), weight 60.6 kg (133 lb 9.6 oz), SpO2 98.00%.  Physical Exam: General: lethargic, difficult to arouse CVS: S1-S2 clear, 2/6 SEM Chest: Clear anteriorly Abdomen: soft nontender, nondistended, normal bowel sounds  Extremities: no cyanosis, clubbing or edema noted bilaterally Neuro: Does not follow commands  Lab Results: Basic Metabolic Panel:  Recent Labs Lab 05/15/13 0435 05/16/13 1557  NA 137 139  K 4.1 4.3  CL 95* 96  CO2 25 25  GLUCOSE 93 158*  BUN 29* 56*  CREATININE 3.61* 6.49*  CALCIUM 8.2* 8.4  PHOS  --  4.7*   Liver Function Tests:  Recent Labs Lab 05/13/13 0932 05/14/13 0535 05/16/13 1557  AST 68* 58*  --   ALT 32 30  --   ALKPHOS 109 91  --   BILITOT 0.3 0.2*  --   PROT 6.3 5.5*  --   ALBUMIN 3.0* 2.6* 2.4*   No results found for this basename: LIPASE, AMYLASE,  in the last 168 hours No results found for this basename: AMMONIA,  in the last 168 hours CBC:  Recent Labs Lab 05/13/13 0932  05/15/13 0435 05/16/13 1557  WBC 9.2  < > 7.3 7.2  NEUTROABS 6.7  --   --   --  HGB 10.0*  < > 9.7* 8.7*  HCT 31.3*  < > 28.9* 26.3*  MCV 101.6*  < > 99.7 98.5  PLT 163  < > 142* 162  < > = values in this interval not displayed. Cardiac Enzymes:  Recent Labs Lab 05/13/13 1029  TROPONINI <0.30   BNP: No components found with this basename: POCBNP,  CBG: No results found for this basename: GLUCAP,  in the last 168 hours   Micro Results: Recent Results (from the past 240 hour(s))  CULTURE, EXPECTORATED SPUTUM-ASSESSMENT     Status: None   Collection Time    05/13/13  8:48 PM      Result Value Ref Range Status   Specimen Description  SPUTUM   Final   Special Requests NONE   Final   Sputum evaluation     Final   Value: MICROSCOPIC FINDINGS SUGGEST THAT THIS SPECIMEN IS NOT REPRESENTATIVE OF LOWER RESPIRATORY SECRETIONS. PLEASE RECOLLECT.     RESULT CALLED TO, READ BACK BY AND VERIFIED WITHLannie Fields RN [phone] GREEN R   Report Status 05/13/2013 FINAL   Final    Studies/Results: Ct Head Wo Contrast  05/13/2013   CLINICAL DATA:  Decreased level of consciousness.  EXAM: CT HEAD WITHOUT CONTRAST  TECHNIQUE: Contiguous axial images were obtained from the base of the skull through the vertex without intravenous contrast.  COMPARISON:  None.  FINDINGS: Moderate atrophy. Chronic infarct in the left parietal lobe. Negative for acute infarct. Negative for hemorrhage or mass lesion.  Air-fluid levels in the sphenoid sinus. Mucosal edema in the ethmoid sinuses. Small air-fluid levels in the frontal sinuses. No acute bony lesion.  IMPRESSION: Atrophy and chronic left parietal infarct. No acute intracranial abnormality.  Sinusitis with air-fluid levels.   Electronically Signed   By: Franchot Gallo M.D.   On: 05/13/2013 10:07   Dg Chest Port 1 View  05/13/2013   CLINICAL DATA:  Cough  EXAM: PORTABLE CHEST - 1 VIEW  COMPARISON:  03/27/2013  FINDINGS: Stable right IJ dialysis catheter tips at the SVC RA junction. Cardiomegaly evident with a a chronic right effusion versus pleural thickening. Right base atelectasis versus scarring noted. Stable left lung aeration. No pneumothorax or superimposed acute process. Atherosclerosis of the aorta.  IMPRESSION: Stable cardiomegaly and chronic findings of the right hemi thorax. No interval change or superimposed acute process   Electronically Signed   By: Daryll Brod M.D.   On: 05/13/2013 10:01    Medications: Scheduled Meds: . amoxicillin-clavulanate  1 tablet Oral q1800  . aspirin EC  81 mg Oral QHS  . calcium acetate  1,334 mg Oral TID WC  . carvedilol  3.125 mg Oral BID WC  . cinacalcet   30 mg Oral QPM  . darbepoetin  60 mcg Intravenous Q7 days  . doxercalciferol  1 mcg Intravenous Q M,W,F-HD  . feeding supplement (NEPRO CARB STEADY)  237 mL Oral BID BM  . ferric gluconate (FERRLECIT/NULECIT) IV  125 mg Intravenous Q M,W,F-HD  . heparin  5,000 Units Subcutaneous 3 times per day  . heparin  7,500 Units Dialysis Once in dialysis  . isosorbide mononitrate  60 mg Oral Daily  . multivitamin  1 tablet Oral QHS  . oseltamivir  30 mg Oral Q M,W,F-1800  . simvastatin  10 mg Oral QHS  . thiamine  100 mg Oral QHS      LOS: 4 days   RAI,RIPUDEEP M.D. [facility] 05/17/2013, 12:42 PM Pager: [phone]  If  7PM-7AM, please contact night-coverage [email or web address] Password [email or web address]

## 2013-05-17 NOTE — Progress Notes (Signed)
Physical Therapy Treatment Patient Details Name: Logan [name] MRN: [id number] DOB: 1919-11-11 Today's Date: 05/17/2013 Time: 7510-2585 PT Time Calculation (min): 13 min  PT Assessment / Plan / Recommendation  History of Present Illness 80 y.o. male admitted to [facility] on 05/13/13 with decreased LOC and upper resp infection. He receives HD MWF @ [facility], history of HTN, stroke, dementia, PVD. He was started on levaquin by his PCP 2 days ago and per his family started having diarrhea and became difficult to arouse.  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  PT + for flu   PT Comments   Pt was able to tolerate sitting EOB today, but is no more alert or participative with the therapy session due to lethargy/AMS.  VSS and he did eat a bite of food while sitting.  He continues to be appropriate for SNF at discharge.   Follow Up Recommendations  SNF     Does the patient have the potential to tolerate intense rehabilitation    NA  Barriers to Discharge   None      Equipment Recommendations  None recommended by PT    Recommendations for Other Services   None  Frequency Min 2X/week   Progress towards PT Goals Progress towards PT goals: Progressing toward goals  Plan Current plan remains appropriate    Precautions / Restrictions Precautions Precautions: Fall Precaution Comments: h/o L AKA   Pertinent Vitals/Pain See vitals flow sheet. VSS on 2 L O2 [place] during session.     Mobility  Bed Mobility Overal bed mobility: Needs Assistance Bed Mobility: Supine to Sit;Sit to Supine Supine to sit: +2 for physical assistance;Max assist Sit to supine: +2 for physical assistance;Max assist General bed mobility comments: Pt needed max support of his trunk and legs during transitions.  He did not resist during transitions and seemed to help <10% after movement was initiated to sitting.       PT Goals (current goals can now be found in the care plan section) Acute Rehab PT Goals Patient Stated Goal: none stated  due to lethargy  Visit Information  Last PT Received On: 05/17/13 Assistance Needed: +2 History of Present Illness: 80 y.o. male admitted to [facility] on 05/13/13 with decreased LOC and upper resp infection. He receives HD MWF @ [facility], history of HTN, stroke, dementia, PVD. He was started on levaquin by his PCP 2 days ago and per his family started having diarrhea and became difficult to arouse.  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  PT + for flu    Subjective Data  Subjective: Pt responds to loud auditory stimuli, but does not open his eyes for 99% of the session.  Patient Stated Goal: none stated due to lethargy   Cognition  Cognition Arousal/Alertness: Lethargic Behavior During Therapy: Flat affect Overall Cognitive Status: Impaired/Different from baseline Area of Impairment: Attention;Memory;Following commands;Safety/judgement;Awareness;Problem solving Current Attention Level: Focused Memory: Decreased recall of precautions;Decreased short-term memory Following Commands: Follows one step commands inconsistently Safety/Judgement: Decreased awareness of safety;Decreased awareness of deficits Awareness:  (none) Problem Solving: Decreased initiation;Requires verbal cues;Requires tactile cues General Comments: It continues to be difficult to assess his cognition due to lethargy.  He did report correctly that his wife's name is [name].     Balance  Balance Overall balance assessment: Needs assistance Sitting-balance support: Bilateral upper extremity supported;Feet supported (right foot supported) Sitting balance-Leahy Scale: Poor Sitting balance - Comments: mod assist to maintain upright sitting balance.  Pt's preference was to lean  forward onto his legs. Pt was mildly more alert in sitting, so attempted to help him eat and he ate one bite of applesauce and two sips of milk through a straw and then reported he was not hungry and would not eat anymore.  He made no attempts to open eyes even with cues to see  if his wife was in the room.   Postural control: Other (comment) (anterior lean with elbows on knee) General Comments General comments (skin integrity, edema, etc.): O2 sats and BP stable on 2 L O2 [place] while sitting EOB.   End of Session PT - End of Session Equipment Utilized During Treatment: Oxygen Activity Tolerance: Patient limited by lethargy Patient left: in bed;with bed alarm set;with call bell/phone within reach     Clarkton B. Marykate Heuberger, PT, DPT [phone]   05/17/2013, 1:15 PM

## 2013-05-17 NOTE — Progress Notes (Signed)
Speech Language Pathology Treatment: Dysphagia  Patient Details Name: Logan [name] MRN: [id number] DOB: 11/16/1919 Today's Date: 05/17/2013 Time: 0900-0920 SLP Time Calculation (min): 20 min  Assessment / Plan / Recommendation Clinical Impression  Pts mentation and agitation preventing ability to take PO. Per assessment pt is able to consume Dys 3 texture and thin liquids with careful feeding techniques without evidence of aspiration. Both treatment session attempts have been unsuccessful in using self feeding to induce pt to accept PO. Will continue efforts and continue to offer pt POs, but unsure if there has been any successful intake over the past two days.    HPI HPI: 80 yo male adm to [facility] with dehydration.  PMH + for ESRD on dialysis, dementia, diarrhea, cough/bronchitis. Swallow evaluation ordered.  Pt CT head negative for acute change shows chronic parietal cva.  CXR negative for acute process.  BSE completed yesterday.   Pertinent Vitals NA  SLP Plan  Continue with current plan of care    Recommendations Diet recommendations: Dysphagia 3 (mechanical soft);Thin liquid Liquids provided via: Straw;Teaspoon Medication Administration: Whole meds with puree Supervision: Intermittent supervision to cue for compensatory strategies;Patient able to self feed Compensations: Slow rate;Small sips/bites;Check for pocketing Postural Changes and/or Swallow Maneuvers: Seated upright 90 degrees;Upright 30-60 min after meal              Oral Care Recommendations: Oral care BID Follow up Recommendations: 24 hour supervision/assistance Plan: Continue with current plan of care    GO    [facility], [place] CCC-SLP [phone]  Lynann Beaver 05/17/2013, 9:40 AM

## 2013-05-17 NOTE — Progress Notes (Signed)
  [facility] Progress Note   Subjective: Pt remains  confused  Filed Vitals:   05/16/13 2214 05/17/13 0515 05/17/13 0543 05/17/13 0700  BP: 147/56 105/49    Pulse: 71 62    Temp: 99.4 F (37.4 C) 97.9 F (36.6 C)    TempSrc: Oral Axillary    Resp: 18 18    Height:    5' 10.08" (1.78 m)  Weight:   60.6 kg (133 lb 9.6 oz)   SpO2: 98% 95%    Exam: Elderly WM, confused, not in distress No jvd Chest clear bilat RRR 2/6 SEM Abd soft, nt, obese, no mass No LE edema, L AKA Neuro- is confused, nonfocal, lethargic   Dialysis: MWF [facility] 4h   68kg   2K/2.25 Bath  Heparin 7500   R IJ cath  Hectorol 1     Epo 6000     Venofer 100 mg x 5 thru 3/2   Assessment: 1 HCAP / flu-  po augmentin and tamiflu 2 ESRD on hemodialysis 3 Dementia / hx CVA- lives at [facility] 4 Anemia, cont esa 5 2HPT- no chg in Rx 6 HTN/volume- BP ok, lowdose coreg, well below dry wt 7 DNR   Plan- HD today    Kelly Splinter MD  pager [phone]    cell [phone]  05/16/2013, 9:58 AM     Recent Labs Lab 05/14/13 0535 05/15/13 0435 05/16/13 1557  NA 139 137 139  K 5.4* 4.1 4.3  CL 98 95* 96  CO2 21 25 25   GLUCOSE 81 93 158*  BUN 78* 29* 56*  CREATININE 7.08* 3.61* 6.49*  CALCIUM 8.1* 8.2* 8.4  PHOS  --   --  4.7*    Recent Labs Lab 05/13/13 0932 05/14/13 0535 05/16/13 1557  AST 68* 58*  --   ALT 32 30  --   ALKPHOS 109 91  --   BILITOT 0.3 0.2*  --   PROT 6.3 5.5*  --   ALBUMIN 3.0* 2.6* 2.4*    Recent Labs Lab 05/13/13 0932 05/14/13 0535 05/15/13 0435 05/16/13 1557  WBC 9.2 6.9 7.3 7.2  NEUTROABS 6.7  --   --   --   HGB 10.0* 8.5* 9.7* 8.7*  HCT 31.3* 26.7* 28.9* 26.3*  MCV 101.6* 100.4* 99.7 98.5  PLT 163 149* 142* 162   . amoxicillin-clavulanate  1 tablet Oral q1800  . aspirin EC  81 mg Oral QHS  . calcium acetate  1,334 mg Oral TID WC  . carvedilol  3.125 mg Oral BID WC  . cinacalcet  30 mg Oral QPM  . darbepoetin  60 mcg Intravenous Q7  days  . doxercalciferol  1 mcg Intravenous Q M,W,F-HD  . feeding supplement (NEPRO CARB STEADY)  237 mL Oral BID BM  . ferric gluconate (FERRLECIT/NULECIT) IV  125 mg Intravenous Q M,W,F-HD  . heparin  5,000 Units Subcutaneous 3 times per day  . heparin  7,500 Units Dialysis Once in dialysis  . isosorbide mononitrate  60 mg Oral Daily  . multivitamin  1 tablet Oral QHS  . oseltamivir  30 mg Oral Q M,W,F-1800  . simvastatin  10 mg Oral QHS  . thiamine  100 mg Oral QHS     sodium chloride, sodium chloride, sodium chloride, sodium chloride, acetaminophen, acetaminophen, carisoprodol, feeding supplement (NEPRO CARB STEADY), feeding supplement (NEPRO CARB STEADY), heparin, heparin, lidocaine (PF), lidocaine (PF), lidocaine-prilocaine, lidocaine-prilocaine, ondansetron (ZOFRAN) IV, ondansetron, pentafluoroprop-tetrafluoroeth, pentafluoroprop-tetrafluoroeth

## 2013-05-17 NOTE — Consult Note (Signed)
NEURO HOSPITALIST CONSULT NOTE    Reason for Consult: altered mental status, stroke  HPI:                                                                                                                                          Logan [name] is an 80 y.o. male with multiple medical problems including HTN, stroke, PVD, OSA, ESRD on dialysis, dementia, BPH, admitted 05/13/13 due to confusion/altered mental status and diarrhea. Patient is very confused and not able to provide clinical history, and therefore all information is obtained from his medical records. He was admitted to [facility] due to a confusional state and decreased level of consciousness that apparently started 4 days before admission.  Initial work up in the ED including CT brain was significant only for elevated creatinine and thus the initial impression was metabolic encephalopathy most likely related to an upper respiratory infection. He has remained persistently confused despite being on antibiotics and thus had MRI brain today that showed a tiny focus of restricted diffusion in the mid right coronal radiata suggestive of a tiny infarct. Mr. Logan [name] aspirin dose was increased to 325 mg daily. On simvastatin 10 mg daily. Had TTE last month: EF is 55-60%, mild aortic stenosis, no PFO mild-to-moderate TR  WBC 7.2, serum Cr 6.49  Last known well: unclear, perhaps 4 days before admission to [facility]. TPa: was not given due to late presentation, unknown time of last known well. Past Medical History  Diagnosis Date  . Hypertension   . ESRD on dialysis     MONDAY, [place], and FRIDAY  . Stroke 2005  . Anemia   . Secondary hyperparathyroidism   . Peripheral vascular disease, unspecified   . Basal cell carcinoma   . BPH (benign prostatic hypertrophy)   . History of Clostridium difficile infection   . Cataract   . Abnormal nuclear stress test 2005    Mild distal anterior ischemia on nuclear test  . Dementia    . Sleep apnea   . H/O hiatal hernia     Past Surgical History  Procedure Laterality Date  . Insertion of dialysis catheter  09/27/2010    Left IJ Diatek Catheter  . Above knee leg amputation  12/26/2004    Left AKA by Dr. Oneida Alar  . Tonsillectomy and adenoidectomy    . Thrombectomy / arteriovenous graft revision      Numerous revision and declots  . Femoral bypass  2005    Left Femoral to posterior tibial BPG w/ composite PTFE and vein  . Hernia repair    . Appendectomy    . Cataract extraction      Left eye   . Arteriovenous graft placement      Family History  Problem Relation Age of Onset  .  Heart disease Maternal Uncle   . Heart attack Father     21's   . Heart attack Sister     42's     Social History:  reports that he has never smoked. He has never used smokeless tobacco. He reports that he does not drink alcohol or use illicit drugs.  No Known Allergies  MEDICATIONS:                                                                                                                     I have reviewed the patient's current medications.   ROS:                                                                                                                                       History obtained from unobtainable from patient due to mental status  Physical exam: agitated. Blood pressure 143/61, pulse 66, temperature 97.9 F (36.6 C), temperature source Axillary, resp. rate 18, height 5' 10.08" (1.78 m), weight 60.6 kg (133 lb 9.6 oz), SpO2 98.00%. Head: normocephalic. Neck: supple, no bruits, no JVD. Cardiac: no murmurs. Lungs: clear. Abdomen: soft, no tender, no mass. Extremities: RLE no edema, s/p left AKA.  Neurologic Examination:                                                                                                      Mental status: awake but confused and agitated, trying to get out of bed. Doesn't follow commands. CN 2-12: difficult to examine due to  agitation.He resists eye opening but pupils seem to be equal size and reactive. No gaze preference. EOM full without nystagmus. Blinks to threat. Face symmetric. Tongue midline. Motor: moves all limbs spontaneously and symmetrically. Sensory: no tested. DTR's: 1 upper extremities and left knee, s/p AKA left. Coordination and gait: unable to test. Plantars: upgoing in the right, s/p left AKA No meningeal irritation signs.  Lab Results  Component Value Date/Time   CHOL  Value: 173  ATP III CLASSIFICATION:  <200     mg/dL   Desirable  200-239  mg/dL   Borderline High  >=240    mg/dL   High        05/22/2009  6:25 AM    Results for orders placed during the hospital encounter of 05/13/13 (from the past 48 hour(s))  CBC     Status: Abnormal   Collection Time    05/16/13  3:57 PM      Result Value Ref Range   WBC 7.2  4.0 - 10.5 K/uL   RBC 2.67 (*) 4.22 - 5.81 MIL/uL   Hemoglobin 8.7 (*) 13.0 - 17.0 g/dL   HCT 26.3 (*) 39.0 - 52.0 %   MCV 98.5  78.0 - 100.0 fL   MCH 32.6  26.0 - 34.0 pg   MCHC 33.1  30.0 - 36.0 g/dL   RDW 16.9 (*) 11.5 - 15.5 %   Platelets 162  150 - 400 K/uL  RENAL FUNCTION PANEL     Status: Abnormal   Collection Time    05/16/13  3:57 PM      Result Value Ref Range   Sodium 139  137 - 147 mEq/L   Potassium 4.3  3.7 - 5.3 mEq/L   Chloride 96  96 - 112 mEq/L   CO2 25  19 - 32 mEq/L   Glucose, Bld 158 (*) 70 - 99 mg/dL   BUN 56 (*) 6 - 23 mg/dL   Comment: DELTA CHECK NOTED   Creatinine, Ser 6.49 (*) 0.50 - 1.35 mg/dL   Comment: DELTA CHECK NOTED   Calcium 8.4  8.4 - 10.5 mg/dL   Phosphorus 4.7 (*) 2.3 - 4.6 mg/dL   Albumin 2.4 (*) 3.5 - 5.2 g/dL   GFR calc non Af Amer 7 (*) >90 mL/min   GFR calc Af Amer 8 (*) >90 mL/min   Comment: (NOTE)     The eGFR has been calculated using the CKD EPI equation.     This calculation has not been validated in all clinical situations.     eGFR's persistently <90 mL/min signify possible Chronic Kidney     Disease.    Mr  Brain Wo Contrast  05/17/2013   CLINICAL DATA:  Encephalopathy. End-stage renal disease. Hypertension.  EXAM: MRI HEAD WITHOUT CONTRAST  TECHNIQUE: Multiplanar, multiecho pulse sequences of the brain and surrounding structures were obtained without intravenous contrast.  COMPARISON:  05/13/2013 CT.  11/05/2003 MR.  FINDINGS: Exam is motion degraded and fast technique imaging had to be utilized.  Suggestion of tiny acute infarct mid right coronal radiata.  Remote tiny thalamic infarcts. Remote tiny left cerebellar infarct. Remote left parietal- occipital lobe infarct.  Prominent small vessel disease type changes.  Global atrophy. Ventricular prominence probably related to atrophy rather than hydrocephalus.  No intracranial mass lesion noted on this unenhanced exam.  Scattered tiny blood breakdown products most likely related to result of prior hemorrhagic ischemia.  Limited imaging of intracranial vasculature with major intracranial vascular structures appearing patent. There may be narrowing of the basilar artery.  Opacification sphenoid sinus air cells with air-fluid levels suggesting acute sinusitis.  Opacification ethmoid sinus air cells.  Minimal frontal sinus air cell mucosal thickening.  IMPRESSION: Exam is motion degraded and fast technique imaging had to be utilized.  Suggestion of tiny acute infarct mid right coronal radiata.  Remote tiny thalamic infarcts. Remote tiny left cerebellar infarct. Remote left parietal- occipital lobe infarct.  Prominent small vessel disease type  changes.  Global atrophy. Ventricular prominence probably related to atrophy rather than hydrocephalus.  Scattered tiny blood breakdown products most likely related to result of prior hemorrhagic ischemia.  Opacification sphenoid sinus air cells with air-fluid levels suggesting acute sinusitis.  Opacification ethmoid sinus air cells.  Minimal frontal sinus air cell mucosal thickening.  These results will be called to the ordering  clinician or representative by the Radiologist Assistant, and communication documented in the PACS Dashboard.   Electronically Signed   By: Chauncey Cruel M.D.   On: 05/17/2013 14:48   Assessment/Plan: 80 y/o with multiple medical problems including dementia, ESRD on dialysis, admitted with worsening confusion/altered mental status.MRI brain shoed a tiny focus of restricted diffusion right parietal region concerning for possible tiny infarct. He was not a candidate for thrombolysis due to late presentation, unknown exact time of last seen well.  I surmise that patient's altered mental status/confusion is possible multifactorial: acute metabolic encephalopathy and small acute stroke in an elderly gentleman with poor cerebral reserve due to underlying dementia. Agree with completing stroke work up, EEG, aspirin 325 mg daily pending results stroke work up. Will follow up.  Dorian Pod, MD 05/17/2013, 3:57 PM

## 2013-05-18 ENCOUNTER — Inpatient Hospital Stay (HOSPITAL_COMMUNITY): Payer: Medicare Other

## 2013-05-18 LAB — LIPID PANEL
Cholesterol: 129 mg/dL (ref 0–200)
HDL: 26 mg/dL — AB (ref >39)
LDL CALC: 69 mg/dL (ref 0–99)
TRIGLYCERIDES: 171 mg/dL — AB (ref <150)
Total CHOL/HDL Ratio: 5 RATIO
VLDL: 34 mg/dL (ref 0–40)

## 2013-05-18 LAB — HEMOGLOBIN A1C
Hgb A1c MFr Bld: 5.8 % — ABNORMAL HIGH (ref <5.7)
Mean Plasma Glucose: 120 mg/dL — ABNORMAL HIGH (ref <117)

## 2013-05-18 LAB — RENAL FUNCTION PANEL
ALBUMIN: 2.7 g/dL — AB (ref 3.5–5.2)
BUN: 42 mg/dL — ABNORMAL HIGH (ref 6–23)
CO2: 24 mEq/L (ref 19–32)
Calcium: 9.2 mg/dL (ref 8.4–10.5)
Chloride: 99 mEq/L (ref 96–112)
Creatinine, Ser: 5.78 mg/dL — ABNORMAL HIGH (ref 0.50–1.35)
GFR calc Af Amer: 9 mL/min — ABNORMAL LOW (ref >90)
GFR, EST NON AFRICAN AMERICAN: 8 mL/min — AB (ref >90)
Glucose, Bld: 87 mg/dL (ref 70–99)
PHOSPHORUS: 5 mg/dL — AB (ref 2.3–4.6)
Potassium: 4.5 mEq/L (ref 3.7–5.3)
Sodium: 142 mEq/L (ref 137–147)

## 2013-05-18 LAB — CBC
HCT: 30.3 % — ABNORMAL LOW (ref 39.0–52.0)
HEMOGLOBIN: 9.8 g/dL — AB (ref 13.0–17.0)
MCH: 32.3 pg (ref 26.0–34.0)
MCHC: 32.3 g/dL (ref 30.0–36.0)
MCV: 100 fL (ref 78.0–100.0)
Platelets: 284 10*3/uL (ref 150–400)
RBC: 3.03 MIL/uL — ABNORMAL LOW (ref 4.22–5.81)
RDW: 16.8 % — ABNORMAL HIGH (ref 11.5–15.5)
WBC: 8 10*3/uL (ref 4.0–10.5)

## 2013-05-18 MED ORDER — DOXERCALCIFEROL 4 MCG/2ML IV SOLN
INTRAVENOUS | Status: AC
  Administered 2013-05-18: 1 ug via INTRAVENOUS
  Filled 2013-05-18: qty 2

## 2013-05-18 NOTE — Progress Notes (Signed)
CSW is continuing to follow.   Terrez Ander, MSW, LCSWA [phone] 

## 2013-05-18 NOTE — Progress Notes (Signed)
[facility] Progress Note   Subjective: Pt remains confused  Filed Vitals:   05/17/13 1122 05/17/13 1700 05/17/13 2121 05/18/13 0604  BP: 143/61 142/63 141/69 190/68  Pulse: 66 64 76 68  Temp:  98.2 F (36.8 C) 97.9 F (36.6 C) 96.8 F (36 C)  TempSrc:  Oral Axillary Axillary  Resp:   18 20  Height:      Weight:      SpO2: 98% 96% 98% 99%  Exam: Elderly WM, confused, not in distress No jvd Chest clear bilat RRR 2/6 SEM Abd soft, nt, obese, no mass No LE edema, L AKA Neuro- is confused, nonfocal, lethargic   Dialysis: MWF [facility] 4h   68kg   2K/2.25 Bath  Heparin 7500   R IJ cath  Hectorol 1     Epo 6000     Venofer 100 mg x 5 thru 3/2   Assessment: 1 HCAP / flu-  po augmentin and tamiflu 2 ESRD on hemodialysis 3 Dementia / hx CVA / AMS- no improvement since admission, EEG per neurology 4 Anemia, cont esa 5 2HPT- no chg in Rx 6 HTN/volume- BP ok, lowdose coreg, well below dry wt 7 DNR- palliative care to be consulted   Plan- HD today    Kelly Splinter MD  pager [phone]    cell [phone]  05/16/2013, 12:14 PM     Recent Labs Lab 05/14/13 0535 05/15/13 0435 05/16/13 1557  NA 139 137 139  K 5.4* 4.1 4.3  CL 98 95* 96  CO2 21 25 25   GLUCOSE 81 93 158*  BUN 78* 29* 56*  CREATININE 7.08* 3.61* 6.49*  CALCIUM 8.1* 8.2* 8.4  PHOS  --   --  4.7*    Recent Labs Lab 05/13/13 0932 05/14/13 0535 05/16/13 1557  AST 68* 58*  --   ALT 32 30  --   ALKPHOS 109 91  --   BILITOT 0.3 0.2*  --   PROT 6.3 5.5*  --   ALBUMIN 3.0* 2.6* 2.4*    Recent Labs Lab 05/13/13 0932 05/14/13 0535 05/15/13 0435 05/16/13 1557  WBC 9.2 6.9 7.3 7.2  NEUTROABS 6.7  --   --   --   HGB 10.0* 8.5* 9.7* 8.7*  HCT 31.3* 26.7* 28.9* 26.3*  MCV 101.6* 100.4* 99.7 98.5  PLT 163 149* 142* 162   . amoxicillin-clavulanate  1 tablet Oral q1800  . aspirin  325 mg Oral Daily   Or  . aspirin  300 mg Rectal Daily  . calcium acetate  1,334 mg Oral TID WC  .  carvedilol  3.125 mg Oral BID WC  . cinacalcet  30 mg Oral QPM  . darbepoetin  60 mcg Intravenous Q7 days  . doxercalciferol  1 mcg Intravenous Q M,W,F-HD  . feeding supplement (NEPRO CARB STEADY)  237 mL Oral BID BM  . ferric gluconate (FERRLECIT/NULECIT) IV  125 mg Intravenous Q M,W,F-HD  . heparin  5,000 Units Subcutaneous 3 times per day  . heparin  7,500 Units Dialysis Once in dialysis  . isosorbide mononitrate  60 mg Oral Daily  . multivitamin  1 tablet Oral QHS  . oseltamivir  30 mg Oral Q M,W,F-1800  . simvastatin  10 mg Oral QHS  . thiamine  100 mg Oral QHS     sodium chloride, sodium chloride, sodium chloride, sodium chloride, acetaminophen, acetaminophen, carisoprodol, feeding supplement (NEPRO CARB STEADY), feeding supplement (NEPRO CARB STEADY), heparin, heparin, lidocaine (PF), lidocaine (PF), lidocaine-prilocaine, lidocaine-prilocaine, ondansetron (  ZOFRAN) IV, ondansetron, pentafluoroprop-tetrafluoroeth, pentafluoroprop-tetrafluoroeth

## 2013-05-18 NOTE — Progress Notes (Signed)
Thank you for consulting the Palliative Medicine Team at [facility] to meet your patient's and family's needs.   The reason that you asked [place] to see your patient is  For Clarification of [place] and options  We have scheduled your patient for a meeting:  Tomorrow morning at 10:00 am   05-19-13  With Dr Eustaquio Maize Michail Sermon NP  Palliative Medicine Team Team Phone # [phone] Pager [phone]

## 2013-05-18 NOTE — Progress Notes (Signed)
Bedside EEG completed. 

## 2013-05-18 NOTE — Procedures (Signed)
EEG report.  Brief clinical history:  80 y/o with multiple medical problems including dementia, ESRD on dialysis, admitted with worsening confusion/altered mental status.   Technique: this is a 17 channel routine scalp EEG performed at the bedside with bipolar and monopolar montages arranged in accordance to the international 10/20 system of electrode placement. One channel was dedicated to EKG recording.  No wakefulness achieved during recording. No activating procedures performed.  Description:patient remains asleep throughout the entire recording and thus a basic background can not be established. Otherwise, the study shows normal sleep architecture without focal or generalized epileptiform discharges noted.   EKG showed sinus rhythm.  Impression: this is a normal asleep EEG. Please, be aware that a normal EEG does not exclude the possibility of epilepsy.  Clinical correlation is advised.  Dorian Pod, MD

## 2013-05-18 NOTE — Progress Notes (Signed)
Speech Language Pathology Treatment: Dysphagia  Patient Details Name: Logan [name] MRN: [id number] DOB: 12/04/1919 Today's Date: 05/18/2013 Time: 3235-5732 SLP Time Calculation (min): 21 min  Assessment / Plan / Recommendation Clinical Impression  Pt still not fully alert, but awake and not agitated with family present. SLP was able to offer straw sips of thin liquids without overt difficulty. Pt would not fully open mouth to accept soft cake, but did take some applesauce and transited without difficulty. Swallow function is impaired due to arousal, but there does not appear to be a neuromuscular deficit from stroke. Aspiration risk is moderate as long as pt is lethargic. Discussed with family who accept that risk and want to continue diet. Recommend downgrade to dys 1(puree) and thin liquids. Will f/u for upgrade if mentation improves and pt still here next week.    HPI HPI: 80 yo male adm to [facility] with dehydration.  PMH + for ESRD on dialysis, dementia, diarrhea, cough/bronchitis. Swallow evaluation ordered.  Pt CT head negative for acute change shows chronic parietal cva.  CXR negative for acute process.  BSE completed yesterday.   Pertinent Vitals NA  SLP Plan  Continue with current plan of care    Recommendations Diet recommendations: Dysphagia 1 (puree);Thin liquid Liquids provided via: Cup;Straw Medication Administration: Crushed with puree Supervision: Full supervision/cueing for compensatory strategies;Staff to assist with self feeding Compensations: Slow rate;Small sips/bites;Check for pocketing Postural Changes and/or Swallow Maneuvers: Seated upright 90 degrees;Upright 30-60 min after meal              Oral Care Recommendations: Oral care BID Follow up Recommendations: 24 hour supervision/assistance Plan: Continue with current plan of care    GO    [facility], MA CCC-SLP [phone]  Lynann Beaver 05/18/2013, 3:24 PM

## 2013-05-18 NOTE — Progress Notes (Signed)
Patient ID: ZAKHAR WILINSKI  male  [id number]    DOB: [birth year]    DOA: 05/13/2013  PCP: Blanchie Serve, MD   Assessment/Plan:  Acute bronchitis/ HCAP/aspiration, Influenza A -Patient has cough with upper respiratory symptoms so was initially started on vancomycin and cefepime, transitioned to oral Augmentin on 02/24 -Continue bronchodilators, mucolytics, oxygen as needed and antitussives.   Influenza A  -Patient is positive for type A. influenza, started on Tamiflu.  -cont Augmentin and Tamiflu.  Acute encephalopathy difficulty likely due to acute illness, dementia with sundowning and new acute CVA on the MRI - MRI showed tiny focus of restricted diffusion in the right parietal region concerning for possible tiny infarct. He is not a candidate for lysis due to delayed presentation. Patient has been in the confusion and lethargic state prior to admission confirmed by the family.  - EEG done today normal - Neurology following, continue aspirin 325 mg oral versus PR and statin - Is still aspirating, swallow evaluation to be repeated - Carotid Dopplers pending  - LDL 69   ESRD  -Nephrology consulted, dialysis on Monday, Wednesday and Friday.   Hypertension  -Home medications continued.   Diarrhea  - Patient reported to have 2-4 bowel movements per day on admission, he was recently started on Levaquin.  - C. difficile PCR was checked and it was negative   DVT Prophylaxis:  Code Status:  Family Communication: Had a detailed conversation with patient's daughter, [name] and patient's wife Ms. Aeden Hassinger. Explained to her all the events and updated in detail, overall poor prognosis, very frail and deconditioned. Patient's wife and daughter explained patient's wishes and his living will that he did not want any heroic measures or artificial feeding. They are at peace with everything and want natural death and comfort care when ready. [name] has had a great life  with loving supportive family. They are open to palliative medicine for goals of care and assistance with disposition, hospice or [facility] if he is eligible. Palliative medicine consult placed and discussed with Dr. Hilma Favors  Disposition: Remains inpatient  Consultants:  Nephrology  Neurology  Palliative medicine consulted today  Procedures:   Antibiotics:  Vancomycin, cefepime, discontinued on 05/15/13  Augmentin  Tamiflu  Subjective: Patient seen and examined, lethargic, still aspirating   Objective: Weight change:   Intake/Output Summary (Last 24 hours) at 05/18/13 1518 Last data filed at 05/18/13 0551  Gross per 24 hour  Intake     50 ml  Output      0 ml  Net     50 ml   Blood pressure 144/68, pulse 56, temperature 97.1 F (36.2 C), temperature source Oral, resp. rate 20, height 5' 10.08" (1.78 m), weight 60.9 kg (134 lb 4.2 oz), SpO2 99.00%.  Physical Exam: General: lethargic CVS: S1-S2 clear, 2/6 SEM Chest:  decreased breath sounds at the bases  Abdomen: soft NT, NBS Extremities: no cyanosis, clubbing or edema noted bilaterally Neuro: Does not follow commands  Lab Results: Basic Metabolic Panel:  Recent Labs Lab 05/15/13 0435 05/16/13 1557  NA 137 139  K 4.1 4.3  CL 95* 96  CO2 25 25  GLUCOSE 93 158*  BUN 29* 56*  CREATININE 3.61* 6.49*  CALCIUM 8.2* 8.4  PHOS  --  4.7*   Liver Function Tests:  Recent Labs Lab 05/13/13 0932 05/14/13 0535 05/16/13 1557  AST 68* 58*  --   ALT 32 30  --   ALKPHOS 109 91  --  BILITOT 0.3 0.2*  --   PROT 6.3 5.5*  --   ALBUMIN 3.0* 2.6* 2.4*   No results found for this basename: LIPASE, AMYLASE,  in the last 168 hours No results found for this basename: AMMONIA,  in the last 168 hours CBC:  Recent Labs Lab 05/13/13 0932  05/15/13 0435 05/16/13 1557  WBC 9.2  < > 7.3 7.2  NEUTROABS 6.7  --   --   --   HGB 10.0*  < > 9.7* 8.7*  HCT 31.3*  < > 28.9* 26.3*  MCV 101.6*  < > 99.7 98.5  PLT  163  < > 142* 162  < > = values in this interval not displayed. Cardiac Enzymes:  Recent Labs Lab 05/13/13 1029  TROPONINI <0.30   BNP: No components found with this basename: POCBNP,  CBG: No results found for this basename: GLUCAP,  in the last 168 hours   Micro Results: Recent Results (from the past 240 hour(s))  CULTURE, EXPECTORATED SPUTUM-ASSESSMENT     Status: None   Collection Time    05/13/13  8:48 PM      Result Value Ref Range Status   Specimen Description SPUTUM   Final   Special Requests NONE   Final   Sputum evaluation     Final   Value: MICROSCOPIC FINDINGS SUGGEST THAT THIS SPECIMEN IS NOT REPRESENTATIVE OF LOWER RESPIRATORY SECRETIONS. PLEASE RECOLLECT.     RESULT CALLED TO, READ BACK BY AND VERIFIED WITHLannie Fields RN [phone] GREEN R   Report Status 05/13/2013 FINAL   Final    Studies/Results: Ct Head Wo Contrast  05/13/2013   CLINICAL DATA:  Decreased level of consciousness.  EXAM: CT HEAD WITHOUT CONTRAST  TECHNIQUE: Contiguous axial images were obtained from the base of the skull through the vertex without intravenous contrast.  COMPARISON:  None.  FINDINGS: Moderate atrophy. Chronic infarct in the left parietal lobe. Negative for acute infarct. Negative for hemorrhage or mass lesion.  Air-fluid levels in the sphenoid sinus. Mucosal edema in the ethmoid sinuses. Small air-fluid levels in the frontal sinuses. No acute bony lesion.  IMPRESSION: Atrophy and chronic left parietal infarct. No acute intracranial abnormality.  Sinusitis with air-fluid levels.   Electronically Signed   By: Franchot Gallo M.D.   On: 05/13/2013 10:07   Dg Chest Port 1 View  05/13/2013   CLINICAL DATA:  Cough  EXAM: PORTABLE CHEST - 1 VIEW  COMPARISON:  03/27/2013  FINDINGS: Stable right IJ dialysis catheter tips at the SVC RA junction. Cardiomegaly evident with a a chronic right effusion versus pleural thickening. Right base atelectasis versus scarring noted. Stable left lung  aeration. No pneumothorax or superimposed acute process. Atherosclerosis of the aorta.  IMPRESSION: Stable cardiomegaly and chronic findings of the right hemi thorax. No interval change or superimposed acute process   Electronically Signed   By: Daryll Brod M.D.   On: 05/13/2013 10:01    Medications: Scheduled Meds: . amoxicillin-clavulanate  1 tablet Oral q1800  . aspirin  325 mg Oral Daily   Or  . aspirin  300 mg Rectal Daily  . calcium acetate  1,334 mg Oral TID WC  . carvedilol  3.125 mg Oral BID WC  . cinacalcet  30 mg Oral QPM  . darbepoetin  60 mcg Intravenous Q7 days  . doxercalciferol  1 mcg Intravenous Q M,W,F-HD  . feeding supplement (NEPRO CARB STEADY)  237 mL Oral BID BM  . ferric gluconate (FERRLECIT/NULECIT) IV  125 mg Intravenous Q M,W,F-HD  . heparin  5,000 Units Subcutaneous 3 times per day  . heparin  7,500 Units Dialysis Once in dialysis  . isosorbide mononitrate  60 mg Oral Daily  . multivitamin  1 tablet Oral QHS  . oseltamivir  30 mg Oral Q M,W,F-1800  . simvastatin  10 mg Oral QHS  . thiamine  100 mg Oral QHS      LOS: 5 days   Cataleah Stites M.D. [facility] 05/18/2013, 3:18 PM Pager: [phone]  If 7PM-7AM, please contact night-coverage [email or web address] Password [email or web address]

## 2013-05-19 DIAGNOSIS — Z515 Encounter for palliative care: Secondary | ICD-10-CM

## 2013-05-19 LAB — GLUCOSE, CAPILLARY: GLUCOSE-CAPILLARY: 81 mg/dL (ref 70–99)

## 2013-05-19 MED ORDER — DIAZEPAM 2 MG PO TABS
2.0000 mg | ORAL_TABLET | Freq: Once | ORAL | Status: DC
  Filled 2013-05-19: qty 1

## 2013-05-19 MED ORDER — BISACODYL 10 MG RE SUPP: 10.0000 mg | Freq: Every day | RECTAL | Status: AC | PRN

## 2013-05-19 MED ORDER — MORPHINE SULFATE (CONCENTRATE) 10 MG /0.5 ML PO SOLN: 10.0000 mg | ORAL | Status: DC | PRN

## 2013-05-19 MED ORDER — ASPIRIN 325 MG PO TABS: 325.0000 mg | ORAL_TABLET | Freq: Every day | ORAL | Status: AC

## 2013-05-19 MED ORDER — HYDROMORPHONE HCL PF 1 MG/ML IJ SOLN: 0.2500 mg | INTRAMUSCULAR | Status: DC | PRN

## 2013-05-19 MED ORDER — DIAZEPAM 5 MG/ML IJ SOLN: 2.5000 mg | INTRAMUSCULAR | Status: DC | PRN

## 2013-05-19 MED ORDER — DIAZEPAM 1 MG/ML PO SOLN: 2.0000 mg | ORAL | Status: DC | PRN

## 2013-05-19 MED ORDER — BISACODYL 10 MG RE SUPP: 10.0000 mg | Freq: Every day | RECTAL | Status: DC | PRN

## 2013-05-19 MED ORDER — DIAZEPAM 1 MG/ML PO SOLN: 2.0000 mg | ORAL | Status: AC | PRN

## 2013-05-19 NOTE — Progress Notes (Signed)
  [facility] Progress Note   Subjective: Pt remains confused  Filed Vitals:   05/18/13 1756 05/18/13 2140 05/19/13 0521 05/19/13 0907  BP: 169/47 130/56 173/66 148/57  Pulse: 69 61 61 61  Temp: 97.6 F (36.4 C) 98.6 F (37 C) 97.6 F (36.4 C)   TempSrc: Oral Oral Oral   Resp: 16 19 19    Height:      Weight: 61 kg (134 lb 7.7 oz)     SpO2: 97% 96% 96%   Exam: Elderly WM, confused, not in distress No jvd Chest clear bilat RRR 2/6 SEM Abd soft, nt, obese, no mass No LE edema, L AKA Neuro- is confused, nonfocal, lethargic   Dialysis: MWF [facility] 4h   68kg   2K/2.25 [place]  Heparin 7500   R IJ cath  Hectorol 1     Epo 6000     Venofer 100 mg x 5 thru 3/2   Assessment: 1 HCAP / flu 2 ESRD on hemodialysis 3 Hx CVA / dementia 4 DNR   Plan- per family and PMT plan is no further HD , pursue hospice care. Will sign off.     Kelly Splinter MD  pager [phone]    cell [phone]  05/16/2013, 12:32 PM     Recent Labs Lab 05/15/13 0435 05/16/13 1557 05/18/13 1431  NA 137 139 142  K 4.1 4.3 4.5  CL 95* 96 99  CO2 25 25 24   GLUCOSE 93 158* 87  BUN 29* 56* 42*  CREATININE 3.61* 6.49* 5.78*  CALCIUM 8.2* 8.4 9.2  PHOS  --  4.7* 5.0*    Recent Labs Lab 05/13/13 0932 05/14/13 0535 05/16/13 1557 05/18/13 1431  AST 68* 58*  --   --   ALT 32 30  --   --   ALKPHOS 109 91  --   --   BILITOT 0.3 0.2*  --   --   PROT 6.3 5.5*  --   --   ALBUMIN 3.0* 2.6* 2.4* 2.7*    Recent Labs Lab 05/13/13 0932  05/15/13 0435 05/16/13 1557 05/18/13 1430  WBC 9.2  < > 7.3 7.2 8.0  NEUTROABS 6.7  --   --   --   --   HGB 10.0*  < > 9.7* 8.7* 9.8*  HCT 31.3*  < > 28.9* 26.3* 30.3*  MCV 101.6*  < > 99.7 98.5 100.0  PLT 163  < > 142* 162 284  < > = values in this interval not displayed. Marland Kitchen amoxicillin-clavulanate  1 tablet Oral q1800  . aspirin  325 mg Oral Daily  . carvedilol  3.125 mg Oral BID WC  . cinacalcet  30 mg Oral QPM  . isosorbide mononitrate  60  mg Oral Daily     acetaminophen, acetaminophen, bisacodyl, carisoprodol, diazepam, feeding supplement (NEPRO CARB STEADY), HYDROmorphone (DILAUDID) injection, ondansetron (ZOFRAN) IV, ondansetron

## 2013-05-19 NOTE — Discharge Summary (Signed)
Physician Discharge Summary  Patient ID: DESHANNON DEMARCE MRN: [id number] DOB/AGE: 06/05/1919 80 y.o.  Admit date: 05/13/2013 Discharge date: 05/19/2013  Primary Care Physician:  Blanchie Serve, MD  Discharge Diagnoses:    . acute Metabolic encephalopathy . Vascular dementia . Upper respiratory infection . Hypertension . ESRD on dialysis . Diarrhea . Acute bronchitis . Influenza A Comfort care  Consults: Neurology, Dr. Aram Beecham                   Nephrology, Dr. Jonnie Finner                  Palliative medicine, Dr. Hilma Favors   Recommendations for Outpatient Follow-up:  Patient is completely comfort care, hemodialysis has been discontinued  Allergies:  No Known Allergies   Discharge Medications:   Medication List    STOP taking these medications       aspirin EC 81 MG tablet  Replaced by:  aspirin 325 MG tablet     darbepoetin 60 MCG/0.3ML Soln injection  Commonly known as:  ARANESP     dextromethorphan-guaiFENesin 30-600 MG per 12 hr tablet  Commonly known as:  MUCINEX DM     doxercalciferol 4 MCG/2ML injection  Commonly known as:  HECTOROL     feeding supplement (NEPRO CARB STEADY) Liqd     levofloxacin 500 MG tablet  Commonly known as:  LEVAQUIN     thiamine 100 MG tablet  Commonly known as:  VITAMIN B-1      TAKE these medications       albuterol (2.5 MG/3ML) 0.083% nebulizer solution  Commonly known as:  PROVENTIL  Take 3 mLs (2.5 mg total) by nebulization every 2 (two) hours as needed for wheezing or shortness of breath.     aspirin 325 MG tablet  Take 1 tablet (325 mg total) by mouth daily.     bisacodyl 10 MG suppository  Commonly known as:  DULCOLAX  Place 1 suppository (10 mg total) rectally daily as needed for mild constipation.     calcium acetate 667 MG capsule  Commonly known as:  PHOSLO  Take 1,334 mg by mouth 3 (three) times daily with meals.     carisoprodol 350 MG tablet  Commonly known as:  SOMA  Take 350 mg by mouth 4 (four)  times daily as needed (for cramps).     carvedilol 3.125 MG tablet  Commonly known as:  COREG  Take 3.125 mg by mouth 2 (two) times daily with a meal.     cinacalcet 30 MG tablet  Commonly known as:  SENSIPAR  Take 30 mg by mouth every evening.     diazepam 1 MG/ML solution  Commonly known as:  VALIUM  Take 2 mLs (2 mg total) by mouth every 4 (four) hours as needed for anxiety, muscle spasms or sedation.     docusate sodium 100 MG capsule  Commonly known as:  COLACE  Take 100 mg by mouth 2 (two) times daily.     isosorbide mononitrate 60 MG 24 hr tablet  Commonly known as:  IMDUR  Take 1 tablet (60 mg total) by mouth daily.     loperamide 2 MG capsule  Commonly known as:  IMODIUM  Take 2-4 mg by mouth daily as needed for diarrhea or loose stools.     multivitamin Tabs tablet  Take 1 tablet by mouth at bedtime.     senna 8.6 MG Tabs tablet  Commonly known as:  SENOKOT  Take 1 tablet by  mouth 2 (two) times daily.     simvastatin 10 MG tablet  Commonly known as:  ZOCOR  Take 10 mg by mouth at bedtime.         Brief H and P: For complete details please refer to admission H and P, but in brief Logan [name] is a 80 y.o. male  End-stage renal disease Monday Wednesday and Friday, hypertension history of stroke comes in for decreased level of consciousness this started 4 days prior to admission progressively getting worse with decreased oral intake. He then saw his primary care doctor 2 days prior to admission was started on Levaquin every other day. As per family he's been also having diarrhea. On the day of admission he could not get out of bed was barely arousable.    Hospital Course:  Lesion is a 80 year old male with multiple medical problems including hypertension, CVA, ESRD on HD, dementia was admitted on 05/13/2013 due to altered mental status, confusion, lethargy and diarrhea. He had decreased level of consciousness which apparently had started 4 days prior to  the admission. Per the initial workup, impression was metabolic encephalopathy related to upper respiratory infection with healthcare associated pneumonia acute bronchitis and influenza A+. Patient has been treated for the upper respiratory infection and influenza AB no significant improvement in his encephalopathy. Patient had a poor functional status prior to admission and having increasing care needs prior to hospitalization. Patient had been extremely debilitated, bed bound with left AKA.  Acute bronchitis/ HCAP/aspiration, Influenza A  -Patient has cough with upper respiratory symptoms so was initially started on vancomycin and cefepime and subsequently transitioned to oral Augmentin on 02/24. Patient was placed on bronchodilators, mucolytics, oxygen as needed and antitussives.   Influenza A Patient is positive for type A. influenza, he has completed a course of Tamiflu.   Acute encephalopathy difficulty likely due to acute illness, dementia with sundowning and new acute CVA on the MRI. His mental status was not improving even after antibiotics and treating the upper respiratory infection. MRI and EEG was pursued to rule out any neurological cause.  MRI showed tiny focus of restricted diffusion in the right parietal region concerning for possible tiny infarct. He is not a candidate for TPA due to delayed presentation. Patient has been in the confusion and lethargic state prior to admission confirmed by the family.  EEG done normal. Neurology was consulted and was placed on aspirin. He has continued to aspirate, swallow evaluation was repeated and recommended dysphagia 1 diet. Carotid Dopplers  showed 1-39% ICA stenosis bilaterally. - LDL 69   ESRD  -Nephrology  was consulted, patient underwent hemodialysis on Monday, Wednesday and Friday Until the goals of care meeting with palliative medicine and family requested comfort care and to stop the hemodialysis on 2/28.   Hypertension -Home medications  continued.   Diarrhea  - Patient reported to have 2-4 bowel movements per day on admission, he was recently started on Levaquin.  - C. difficile PCR was checked and it was negative   Goals of care: Palliative medicine was consulted on 05/18/2013 after detailed conversation with his family (spouse and daughter). Patient's family requested that he had never wanted any heroic measures or artificial feeding. Per family he had great life and loving supportive family and they requested for palliative medicine assistance for hospice.   Day of Discharge BP 109/56  Pulse 66  Temp(Src) 97.7 F (36.5 C) (Oral)  Resp 20  Ht 5' 10.08" (1.78 m)  Wt 61  kg (134 lb 7.7 oz)  BMI 19.25 kg/m2  SpO2 100%  Physical Exam:  General:  Intermittently responds to questions CVS: S1-S2 clear, 2/6 SEM  Chest: decreased breath sounds at the bases  Abdomen: soft NT, NBS  Extremities: no cyanosis, clubbing or edema noted bilaterally  Neuro: Does not follow commands   The results of significant diagnostics from this hospitalization (including imaging, microbiology, ancillary and laboratory) are listed below for reference.    LAB RESULTS: Basic Metabolic Panel:  Recent Labs Lab 05/16/13 1557 05/18/13 1431  NA 139 142  K 4.3 4.5  CL 96 99  CO2 25 24  GLUCOSE 158* 87  BUN 56* 42*  CREATININE 6.49* 5.78*  CALCIUM 8.4 9.2  PHOS 4.7* 5.0*   Liver Function Tests:  Recent Labs Lab 05/13/13 0932 05/14/13 0535 05/16/13 1557 05/18/13 1431  AST 68* 58*  --   --   ALT 32 30  --   --   ALKPHOS 109 91  --   --   BILITOT 0.3 0.2*  --   --   PROT 6.3 5.5*  --   --   ALBUMIN 3.0* 2.6* 2.4* 2.7*   No results found for this basename: LIPASE, AMYLASE,  in the last 168 hours No results found for this basename: AMMONIA,  in the last 168 hours CBC:  Recent Labs Lab 05/13/13 0932  05/16/13 1557 05/18/13 1430  WBC 9.2  < > 7.2 8.0  NEUTROABS 6.7  --   --   --   HGB 10.0*  < > 8.7* 9.8*  HCT 31.3*  <  > 26.3* 30.3*  MCV 101.6*  < > 98.5 100.0  PLT 163  < > 162 284  < > = values in this interval not displayed. Cardiac Enzymes:  Recent Labs Lab 05/13/13 1029  TROPONINI <0.30   BNP: No components found with this basename: POCBNP,  CBG:  Recent Labs Lab 05/19/13 0809  GLUCAP 81    Significant Diagnostic Studies:  Ct Head Wo Contrast  05/13/2013   CLINICAL DATA:  Decreased level of consciousness.  EXAM: CT HEAD WITHOUT CONTRAST  TECHNIQUE: Contiguous axial images were obtained from the base of the skull through the vertex without intravenous contrast.  COMPARISON:  None.  FINDINGS: Moderate atrophy. Chronic infarct in the left parietal lobe. Negative for acute infarct. Negative for hemorrhage or mass lesion.  Air-fluid levels in the sphenoid sinus. Mucosal edema in the ethmoid sinuses. Small air-fluid levels in the frontal sinuses. No acute bony lesion.  IMPRESSION: Atrophy and chronic left parietal infarct. No acute intracranial abnormality.  Sinusitis with air-fluid levels.   Electronically Signed   By: Franchot Gallo M.D.   On: 05/13/2013 10:07   Dg Chest Port 1 View  05/13/2013   CLINICAL DATA:  Cough  EXAM: PORTABLE CHEST - 1 VIEW  COMPARISON:  03/27/2013  FINDINGS: Stable right IJ dialysis catheter tips at the SVC RA junction. Cardiomegaly evident with a a chronic right effusion versus pleural thickening. Right base atelectasis versus scarring noted. Stable left lung aeration. No pneumothorax or superimposed acute process. Atherosclerosis of the aorta.  IMPRESSION: Stable cardiomegaly and chronic findings of the right hemi thorax. No interval change or superimposed acute process   Electronically Signed   By: Daryll Brod M.D.   On: 05/13/2013 10:01       Disposition and Follow-up:     Discharge Orders   Future Orders Complete By Expires   Discharge instructions  As directed  Comments:     Diet: dysphagia 1/ comfort feeds   Increase activity slowly  As directed         DISPOSITION:Residential hospice  DIET: Dysphagia 1, comfort feeds   DISCHARGE FOLLOW-UP Follow-up Information   Follow up with Blanchie Serve, MD. (As needed, If symptoms worsen)    Specialty:  Internal Medicine   Contact information:   [place] [address] [phone]       Time spent on Discharge: 45 minutes  Signed:   RAI,RIPUDEEP M.D. [facility] 05/19/2013, 2:28 PM Pager: [phone]

## 2013-05-19 NOTE — Consult Note (Addendum)
Palliative Medicine Team at [facility]  Date: 05/19/2013   Patient Name: Logan [name]  DOB: [birth year]  MRN: [id number]  Age / Sex: 80 y.o., male   PCP: Blanchie Serve, MD Referring Physician: Mendel Corning, MD  HPI/Reason for Consultation: Mr. 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 Increasing care needs prior to hospitalization, now not tolerating HD and is extremely debilitated-bed bound with left AKA. PMT asked to consult for goals of care.    Participants in Discussion: Patient's wife, daughter [name], and son Cecilie Lowers.  Goals/Summary of Discussion:  1. Code Status:  DNR  2. Scope of Treatment:  Full comfort  Discontinue hemodialysis  Comfort medications only  Dignity and QOL  3. Assessment/Plan:  Primary Diagnoses  1. ESRD, now stopping HD-last tx 2/29-net even 2. Influenza A 3. HCAP 4. New Stroke, CVA 5. Delirium  Prognosis: 5-7 days  PPS 20%  Active Symptoms 1. Agitation 2. Dyspnea 3. Immobility 4. Dysphagia  Recommendations:  IV low dose dilaudid for dyspnea and pain  IV valium/diazapam for agitation/delirium  Discontinue non-essential meds  4. Palliative Prophylaxis:   Bowel Regimen -yes  Terminal Secretions-yes  Breakthrough Pain and Dyspnea -yes  Agitation and Delirium-yes  Nausea-yes  5. Psychosocial Spiritual Asssessment/Interventions:  Patient and Family Adjustment to Illness/Prognosis: Coping well in general - married for >60 years, daughter [name] is local, son Cecilie Lowers just got in from [place].  Spiritual Concerns or Needs: Requested Chaplain   6. Disposition: Need Hospice Facility ASAP- they would accept a bed in [place] or [place] (Ann lives in [place]). Wife is at [facility].  Active Medications:  Outpatient medications: Prescriptions prior to admission  Medication Sig Dispense  Refill  . albuterol (PROVENTIL) (2.5 MG/3ML) 0.083% nebulizer solution Take 3 mLs (2.5 mg total) by nebulization every 2 (two) hours as needed for wheezing or shortness of breath.  75 mL  12  . aspirin EC 81 MG tablet Take 81 mg by mouth at bedtime.      . calcium acetate (PHOSLO) 667 MG capsule Take 1,334 mg by mouth 3 (three) times daily with meals.      . carisoprodol (SOMA) 350 MG tablet Take 350 mg by mouth 4 (four) times daily as needed (for cramps).      . carvedilol (COREG) 3.125 MG tablet Take 3.125 mg by mouth 2 (two) times daily with a meal.       . cinacalcet (SENSIPAR) 30 MG tablet Take 30 mg by mouth every evening.      . darbepoetin (ARANESP) 60 MCG/0.3ML SOLN injection Inject 0.3 mLs (60 mcg total) into the vein every Wednesday with hemodialysis.  4.2 mL    . dextromethorphan-guaiFENesin (MUCINEX DM) 30-600 MG per 12 hr tablet Take 1 tablet by mouth 2 (two) times daily.      Marland Kitchen docusate sodium (COLACE) 100 MG capsule Take 100 mg by mouth 2 (two) times daily.      Marland Kitchen doxercalciferol (HECTOROL) 4 MCG/2ML injection Inject 1 mcg into the vein every Monday, Wednesday, and Friday with hemodialysis.      Marland Kitchen isosorbide mononitrate (IMDUR) 60 MG 24 hr tablet Take 1 tablet (60 mg total) by mouth daily.  30 tablet  0  . levofloxacin (LEVAQUIN) 500 MG tablet Take 1 tablet (500 mg total) by mouth every other day.  3 tablet  0  . loperamide (IMODIUM)  2 MG capsule Take 2-4 mg by mouth daily as needed for diarrhea or loose stools.      . multivitamin (RENA-VIT) TABS tablet Take 1 tablet by mouth at bedtime.      . Nutritional Supplements (FEEDING SUPPLEMENT, NEPRO CARB STEADY,) LIQD Take 237 mLs by mouth 2 (two) times daily between meals.    0  . senna (SENOKOT) 8.6 MG TABS tablet Take 1 tablet by mouth 2 (two) times daily.      . simvastatin (ZOCOR) 10 MG tablet Take 10 mg by mouth at bedtime.      . thiamine (VITAMIN B-1) 100 MG tablet Take 100 mg by mouth at bedtime.        PRN  Medications: sodium chloride, sodium chloride, sodium chloride, sodium chloride, acetaminophen, acetaminophen, carisoprodol, feeding supplement (NEPRO CARB STEADY), feeding supplement (NEPRO CARB STEADY), heparin, heparin, lidocaine (PF), lidocaine (PF), lidocaine-prilocaine, lidocaine-prilocaine, ondansetron (ZOFRAN) IV, ondansetron, pentafluoroprop-tetrafluoroeth, pentafluoroprop-tetrafluoroeth   Vital Signs: BP 148/57  Pulse 61  Temp(Src) 97.6 F (36.4 C) (Oral)  Resp 19  Ht 5' 10.08" (1.78 m)  Wt 61 kg (134 lb 7.7 oz)  BMI 19.25 kg/m2  SpO2 96%   Physical Exam:  Dusky and agitated, responds to voice but has difficulty opening his eyes, can take sips of liquid -minimal, anterior chest is clear but diminished in the bases. AKA. Abdomen is soft, non-tender.  Labs:  Basic or Comprehensive Metabolic Panel:    Component Value Date/Time   NA 142 05/18/2013 1431   NA 144 11/14/2012 1152   K 4.5 05/18/2013 1431   CL 99 05/18/2013 1431   CO2 24 05/18/2013 1431   BUN 42* 05/18/2013 1431   BUN 27 11/14/2012 1152   CREATININE 5.78* 05/18/2013 1431   GLUCOSE 87 05/18/2013 1431   GLUCOSE 128* 11/14/2012 1152   CALCIUM 9.2 05/18/2013 1431   AST 58* 05/14/2013 0535   ALT 30 05/14/2013 0535   ALKPHOS 91 05/14/2013 0535   BILITOT 0.2* 05/14/2013 0535   PROT 5.5* 05/14/2013 0535   PROT 5.7* 11/14/2012 1152   ALBUMIN 2.7* 05/18/2013 1431     CBC:    Component Value Date/Time   WBC 8.0 05/18/2013 1430   WBC 8.1 11/14/2012 1152   HGB 9.8* 05/18/2013 1430   HCT 30.3* 05/18/2013 1430   PLT 284 05/18/2013 1430   MCV 100.0 05/18/2013 1430   NEUTROABS 6.7 05/13/2013 0932   NEUTROABS 5.5 11/14/2012 1152   LYMPHSABS 1.2 05/13/2013 0932   LYMPHSABS 1.2 11/14/2012 1152   MONOABS 1.2* 05/13/2013 0932   EOSABS 0.0 05/13/2013 0932   EOSABS 0.3 11/14/2012 1152   BASOSABS 0.0 05/13/2013 0932   BASOSABS 0.0 11/14/2012 1152   Imaging:  Mr Brain Wo Contrast  05/17/2013   CLINICAL DATA:  Encephalopathy. End-stage renal  disease. Hypertension.  EXAM: MRI HEAD WITHOUT CONTRAST  TECHNIQUE: Multiplanar, multiecho pulse sequences of the brain and surrounding structures were obtained without intravenous contrast.  COMPARISON:  05/13/2013 CT.  11/05/2003 MR.  FINDINGS: Exam is motion degraded and fast technique imaging had to be utilized.  Suggestion of tiny acute infarct mid right coronal radiata.  Remote tiny thalamic infarcts. Remote tiny left cerebellar infarct. Remote left parietal- occipital lobe infarct.  Prominent small vessel disease type changes.  Global atrophy. Ventricular prominence probably related to atrophy rather than hydrocephalus.  No intracranial mass lesion noted on this unenhanced exam.  Scattered tiny blood breakdown products most likely related to result of prior hemorrhagic ischemia.  Limited imaging  of intracranial vasculature with major intracranial vascular structures appearing patent. There may be narrowing of the basilar artery.  Opacification sphenoid sinus air cells with air-fluid levels suggesting acute sinusitis.  Opacification ethmoid sinus air cells.  Minimal frontal sinus air cell mucosal thickening.  IMPRESSION: Exam is motion degraded and fast technique imaging had to be utilized.  Suggestion of tiny acute infarct mid right coronal radiata.  Remote tiny thalamic infarcts. Remote tiny left cerebellar infarct. Remote left parietal- occipital lobe infarct.  Prominent small vessel disease type changes.  Global atrophy. Ventricular prominence probably related to atrophy rather than hydrocephalus.  Scattered tiny blood breakdown products most likely related to result of prior hemorrhagic ischemia.  Opacification sphenoid sinus air cells with air-fluid levels suggesting acute sinusitis.  Opacification ethmoid sinus air cells.  Minimal frontal sinus air cell mucosal thickening.  These results will be called to the ordering clinician or representative by the Radiologist Assistant, and communication documented  in the PACS Dashboard.   Electronically Signed   By: Chauncey Cruel M.D.   On: 05/17/2013 14:48    Other Data:  (2D echo, EKG...)   Educational Materials Given:  Hard Choices and Gone From My Sight  Time: 70 minutes Greater than 50%  of this time was spent counseling and coordinating care related to the above assessment and plan.  Signed by: Acquanetta Chain, DO  05/19/2013, 10:45 AM  Please contact Palliative Medicine Team phone at [phone] for questions and concerns.

## 2013-05-19 NOTE — Progress Notes (Signed)
Patient is transferring to [facility] residential hospice home today. [facility] liaison completed paper work with family at bedside. CSW prepared D/C packet and gold DNR form is in packet. [facility] will transport. [facility] aware. Son at bedside and aware of above. Please reconsult if future social work needs arise. CSW signing off.   Blima Rich, [place] [facility] CSW [phone]

## 2013-05-19 NOTE — Progress Notes (Signed)
Patient ID: Logan [name]  male  [id number]:[id number]    DOB: 08-20-1919    DOA: 05/13/2013  PCP: Blanchie Serve, MD   Assessment/Plan:  Acute bronchitis/ HCAP/aspiration, Influenza A -Patient has cough with upper respiratory symptoms so was initially started on vancomycin and cefepime, transitioned to oral Augmentin on 02/24 -Continue bronchodilators, mucolytics, oxygen as needed and antitussives.   Influenza A  -Patient is positive for type A. influenza, started on Tamiflu.  -cont Augmentin and Tamiflu.  Acute encephalopathy difficulty likely due to acute illness, dementia with sundowning and new acute CVA on the MRI - MRI showed tiny focus of restricted diffusion in the right parietal region concerning for possible tiny infarct. He is not a candidate for lysis due to delayed presentation. Patient has been in the confusion and lethargic state prior to admission confirmed by the family.  - EEG done normal - Neurology following, continue aspirin 325 mg oral versus PR and statin - Is still aspirating, swallow evaluation to be repeated - Carotid Dopplers pending  - LDL 69   ESRD  -Nephrology consulted, dialysis on Monday, Wednesday and Friday.   Hypertension  -Home medications continued.   Diarrhea  - Patient reported to have 2-4 bowel movements per day on admission, he was recently started on Levaquin.  - C. difficile PCR was checked and it was negative   DVT Prophylaxis:  Code Status:  Family Communication: Palliative medicine meeting scheduled today, per Dr. Hilma Favors, start hemodialysis, comfort care, DC to residential hospice when bed available  Disposition: Remains inpatient until residential hospice bed available  Consultants:  Nephrology  Neurology  Palliative medicine consulted today  Procedures:   Antibiotics:  Vancomycin, cefepime, discontinued on 05/15/13  Augmentin  Tamiflu  Subjective: Patient seen and examined, somewhat more alert from  yesterday but still not following commands  Objective: Weight change:   Intake/Output Summary (Last 24 hours) at 05/19/13 1241 Last data filed at 05/19/13 0900  Gross per 24 hour  Intake      0 ml  Output      0 ml  Net      0 ml   Blood pressure 148/57, pulse 61, temperature 97.6 F (36.4 C), temperature source Oral, resp. rate 19, height 5' 10.08" (1.78 m), weight 61 kg (134 lb 7.7 oz), SpO2 96.00%.  Physical Exam: General: Somewhat more in alert but not following commands CVS: S1-S2 clear, 2/6 SEM Chest:  decreased breath sounds at the bases  Abdomen: soft NT, NBS Extremities: no cyanosis, clubbing or edema noted bilaterally Neuro: Does not follow commands  Lab Results: Basic Metabolic Panel:  Recent Labs Lab 05/16/13 1557 05/18/13 1431  NA 139 142  K 4.3 4.5  CL 96 99  CO2 25 24  GLUCOSE 158* 87  BUN 56* 42*  CREATININE 6.49* 5.78*  CALCIUM 8.4 9.2  PHOS 4.7* 5.0*   Liver Function Tests:  Recent Labs Lab 05/13/13 0932 05/14/13 0535 05/16/13 1557 05/18/13 1431  AST 68* 58*  --   --   ALT 32 30  --   --   ALKPHOS 109 91  --   --   BILITOT 0.3 0.2*  --   --   PROT 6.3 5.5*  --   --   ALBUMIN 3.0* 2.6* 2.4* 2.7*   No results found for this basename: LIPASE, AMYLASE,  in the last 168 hours No results found for this basename: AMMONIA,  in the last 168 hours CBC:  Recent Labs Lab 05/13/13 0932  05/16/13 1557 05/18/13 1430  WBC 9.2  < > 7.2 8.0  NEUTROABS 6.7  --   --   --   HGB 10.0*  < > 8.7* 9.8*  HCT 31.3*  < > 26.3* 30.3*  MCV 101.6*  < > 98.5 100.0  PLT 163  < > 162 284  < > = values in this interval not displayed. Cardiac Enzymes:  Recent Labs Lab 05/13/13 1029  TROPONINI <0.30   BNP: No components found with this basename: POCBNP,  CBG:  Recent Labs Lab 05/19/13 0809  GLUCAP 81     Micro Results: Recent Results (from the past 240 hour(s))  CULTURE, EXPECTORATED SPUTUM-ASSESSMENT     Status: None   Collection Time     05/13/13  8:48 PM      Result Value Ref Range Status   Specimen Description SPUTUM   Final   Special Requests NONE   Final   Sputum evaluation     Final   Value: MICROSCOPIC FINDINGS SUGGEST THAT THIS SPECIMEN IS NOT REPRESENTATIVE OF LOWER RESPIRATORY SECRETIONS. PLEASE RECOLLECT.     RESULT CALLED TO, READ BACK BY AND VERIFIED WITHLannie Fields RN [id number] GREEN R   Report Status 05/13/2013 FINAL   Final    Studies/Results: Ct Head Wo Contrast  05/13/2013   CLINICAL DATA:  Decreased level of consciousness.  EXAM: CT HEAD WITHOUT CONTRAST  TECHNIQUE: Contiguous axial images were obtained from the base of the skull through the vertex without intravenous contrast.  COMPARISON:  None.  FINDINGS: Moderate atrophy. Chronic infarct in the left parietal lobe. Negative for acute infarct. Negative for hemorrhage or mass lesion.  Air-fluid levels in the sphenoid sinus. Mucosal edema in the ethmoid sinuses. Small air-fluid levels in the frontal sinuses. No acute bony lesion.  IMPRESSION: Atrophy and chronic left parietal infarct. No acute intracranial abnormality.  Sinusitis with air-fluid levels.   Electronically Signed   By: Franchot Gallo M.D.   On: 05/13/2013 10:07   Dg Chest Port 1 View  05/13/2013   CLINICAL DATA:  Cough  EXAM: PORTABLE CHEST - 1 VIEW  COMPARISON:  03/27/2013  FINDINGS: Stable right IJ dialysis catheter tips at the SVC RA junction. Cardiomegaly evident with a a chronic right effusion versus pleural thickening. Right base atelectasis versus scarring noted. Stable left lung aeration. No pneumothorax or superimposed acute process. Atherosclerosis of the aorta.  IMPRESSION: Stable cardiomegaly and chronic findings of the right hemi thorax. No interval change or superimposed acute process   Electronically Signed   By: Daryll Brod M.D.   On: 05/13/2013 10:01    Medications: Scheduled Meds: . amoxicillin-clavulanate  1 tablet Oral q1800  . aspirin  325 mg Oral Daily  . carvedilol   3.125 mg Oral BID WC  . cinacalcet  30 mg Oral QPM  . isosorbide mononitrate  60 mg Oral Daily      LOS: 6 days   Kamdin Follett M.D. [facility] 05/19/2013, 12:41 PM Pager: [phone]  If 7PM-7AM, please contact night-coverage [email or web address] Password [email or web address]

## 2013-05-19 NOTE — Progress Notes (Signed)
VASCULAR LAB PRELIMINARY  PRELIMINARY  PRELIMINARY  PRELIMINARY  Carotid Dopplers completed.    Preliminary report:  1-39% ICA stenosis.  Vertebral artery flow is antegrade.   Lashanna Angelo, RVT 05/19/2013, 11:08 AM

## 2013-06-20 DEATH — deceased

## 2015-01-30 IMAGING — CR DG CHEST 1V PORT
1 series · 1 of 1 positions shown · non-contrast
Comparison: 04/20/2012

CLINICAL DATA: Chest pain and shortness of breath.

EXAM:
PORTABLE CHEST - 1 VIEW

[AP]
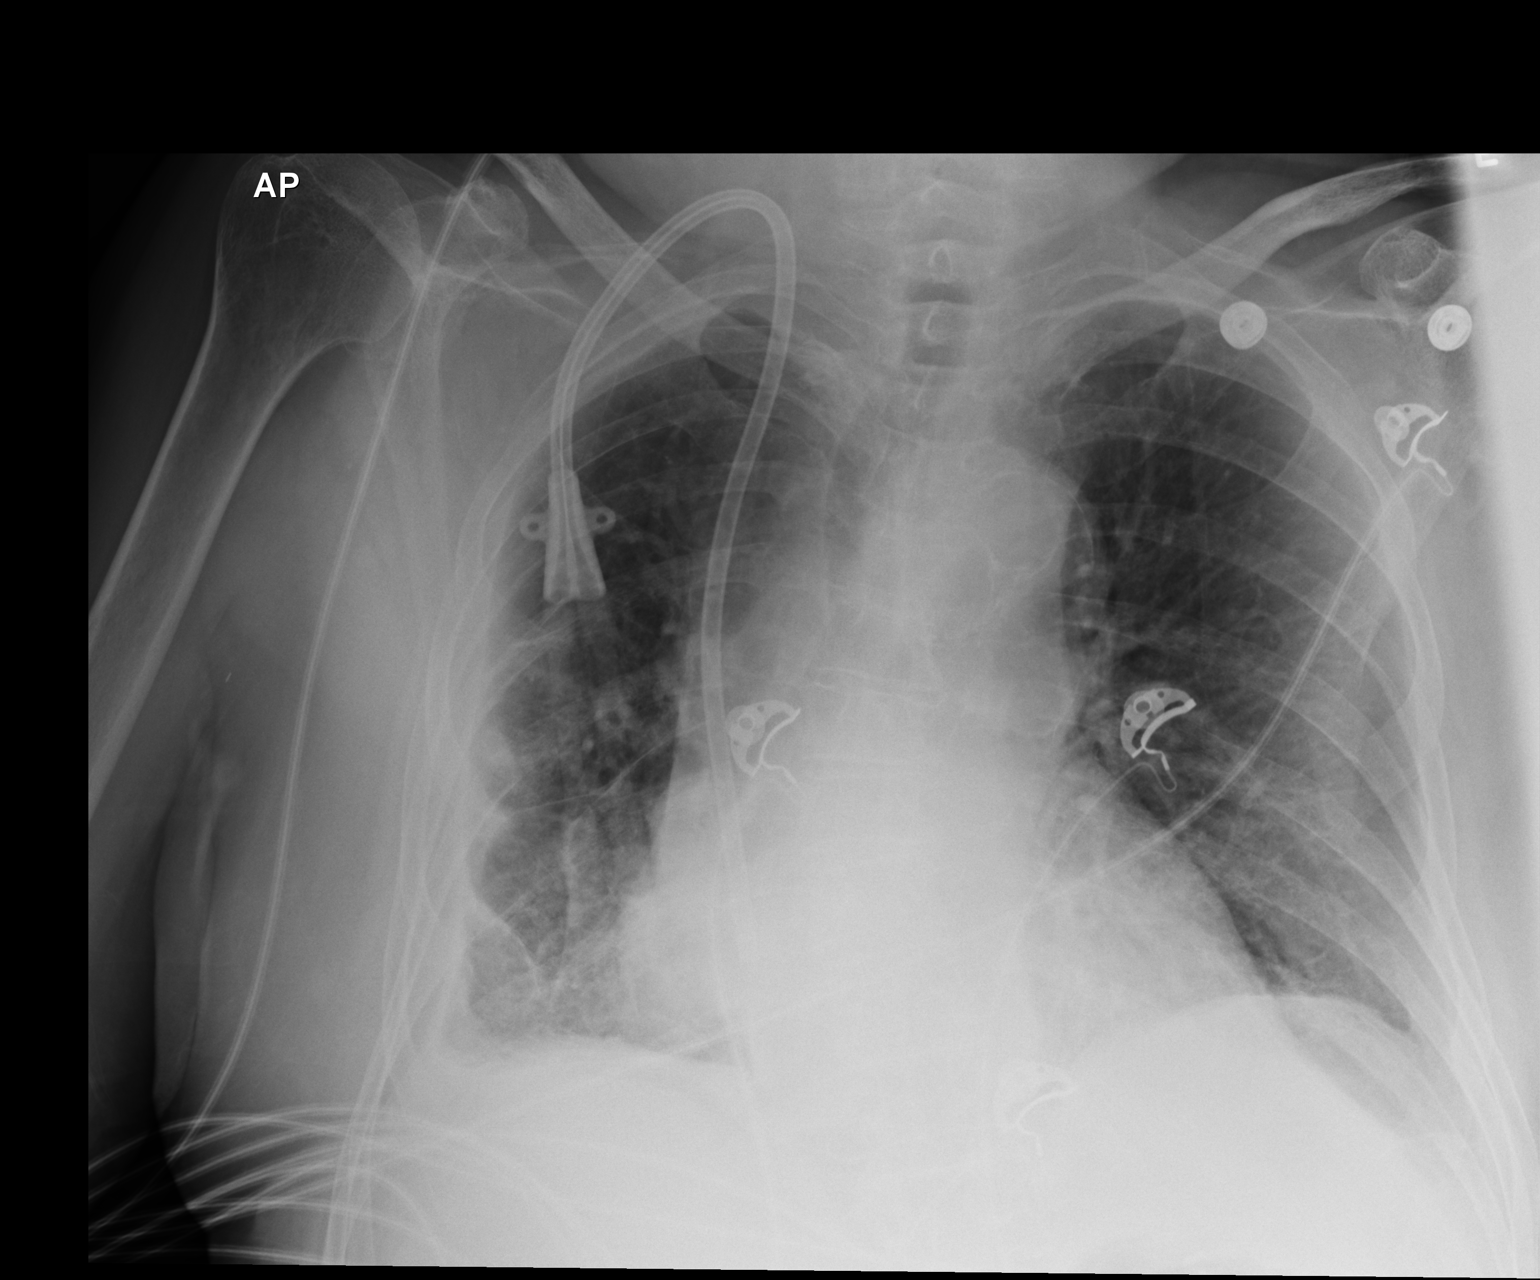

[1 of 1 positions shown; findings below may reference images not displayed]

FINDINGS: Stable appearance of dialysis catheter and chronic lung disease.
Stable chronic pleural thickening in the right hemithorax. No overt
edema or airspace consolidation is seen. There is stable
cardiomegaly. No significant pleural fluid.
IMPRESSION: Stable cardiomegaly and chronic lung disease.

## 2015-03-18 IMAGING — CR DG CHEST 1V PORT
1 series · 1 of 1 positions shown · non-contrast
Comparison: 03/27/2013

CLINICAL DATA: Cough

EXAM:
PORTABLE CHEST - 1 VIEW

[AP]
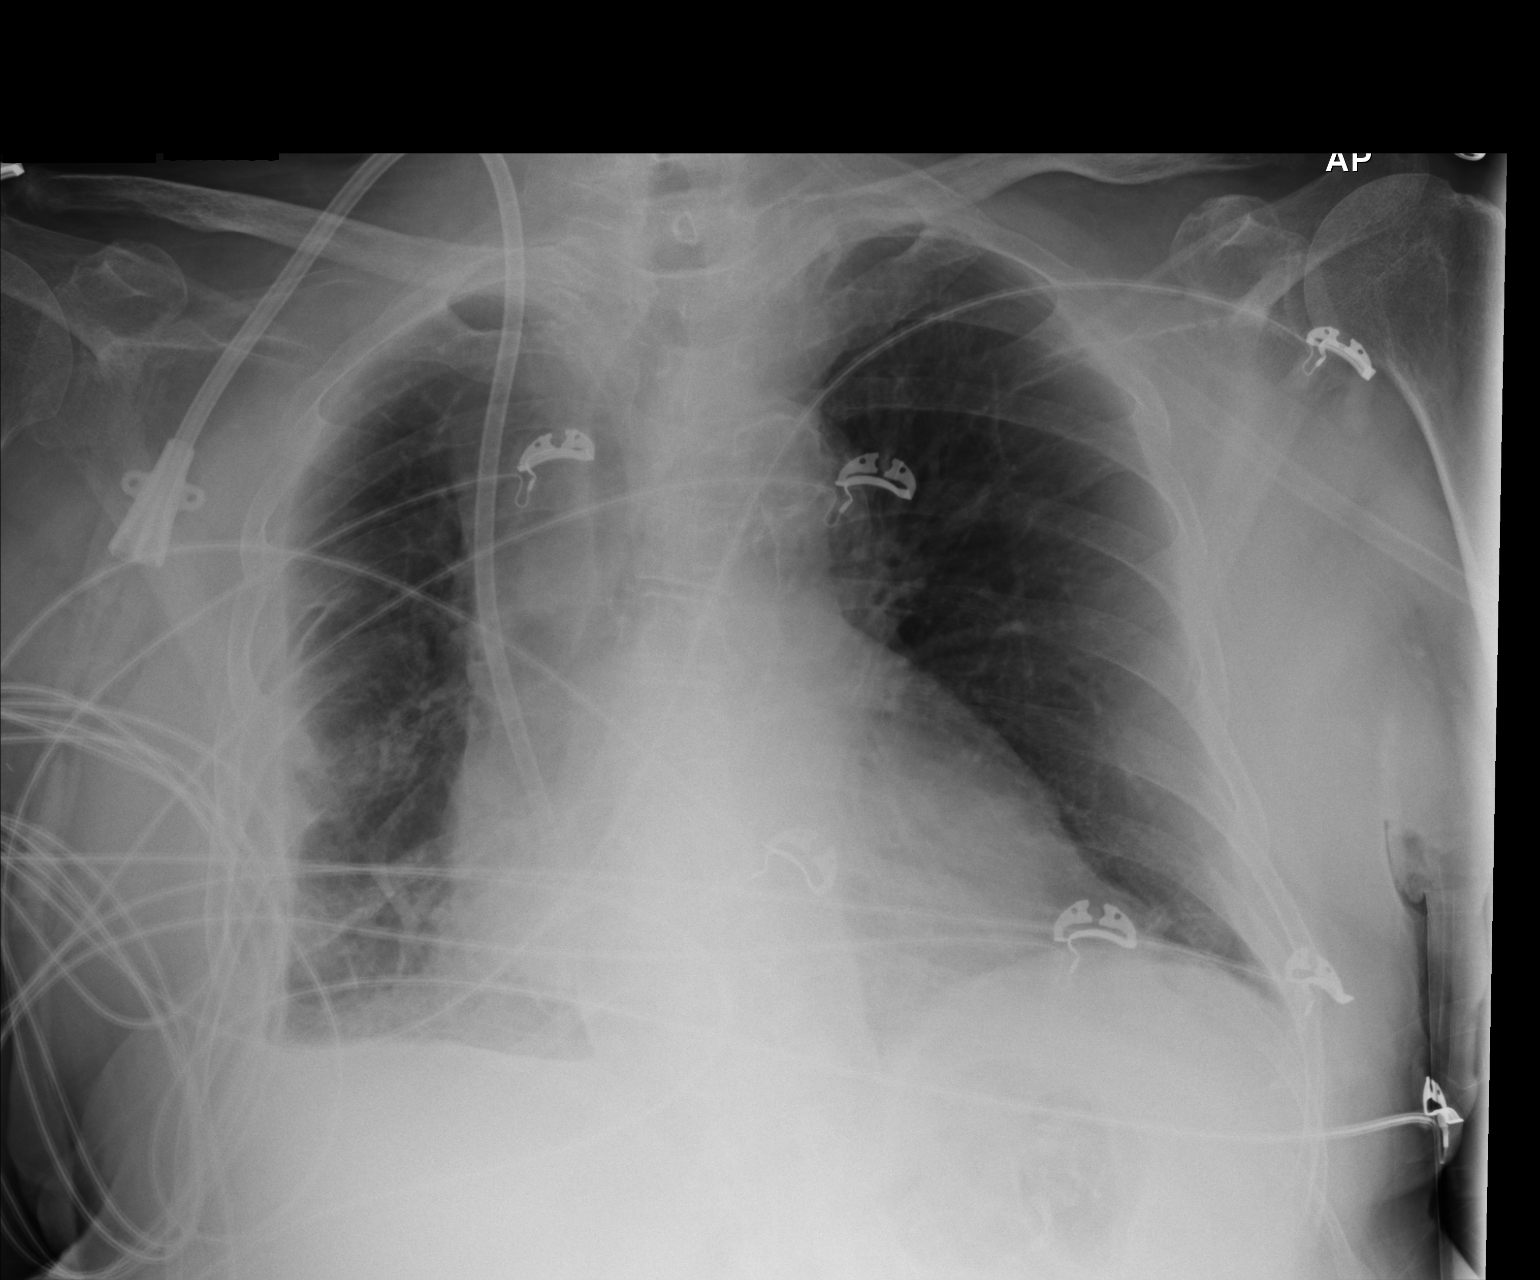

[1 of 1 positions shown; findings below may reference images not displayed]

FINDINGS: Stable right IJ dialysis catheter tips at the SVC RA junction.
Cardiomegaly evident with a a chronic right effusion versus pleural
thickening. Right base atelectasis versus scarring noted. Stable
left lung aeration. No pneumothorax or superimposed acute process.
Atherosclerosis of the aorta.
IMPRESSION: Stable cardiomegaly and chronic findings of the right hemi thorax.
No interval change or superimposed acute process

## 2015-03-18 IMAGING — CT CT HEAD W/O CM
2 series · 16 of 30 positions shown, 18 images · non-contrast
Comparison: None.

CLINICAL DATA: Decreased level of consciousness.

EXAM:
CT HEAD WITHOUT CONTRAST
TECHNIQUE: Contiguous axial images were obtained from the base of the skull
through the vertex without intravenous contrast.

[Series 2: head w/o · axial · non-contrast · 0.49mm/px · z∈[+76,+211]mm · 8 of 35 slices shown, 10 images]
[im 4/35  brain]
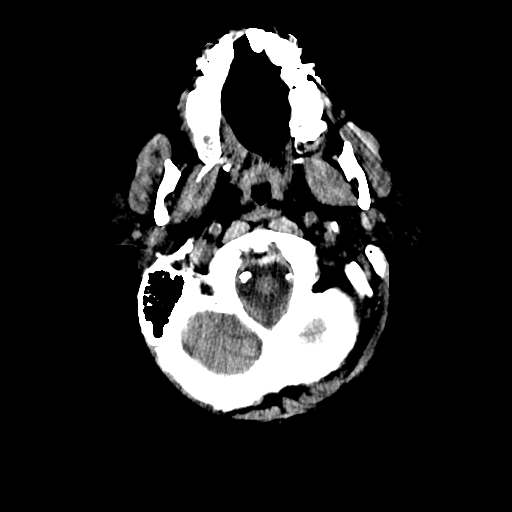
[im 4/35  bone]
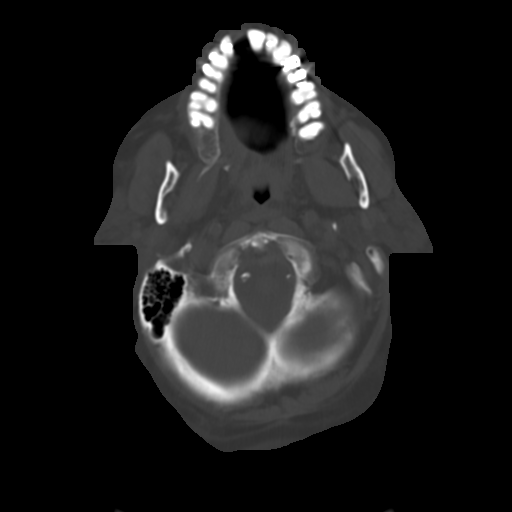
[im 8/35  brain]
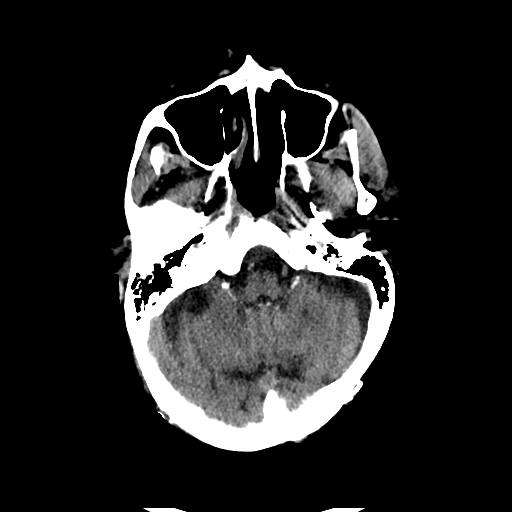
[im 12/35  brain]
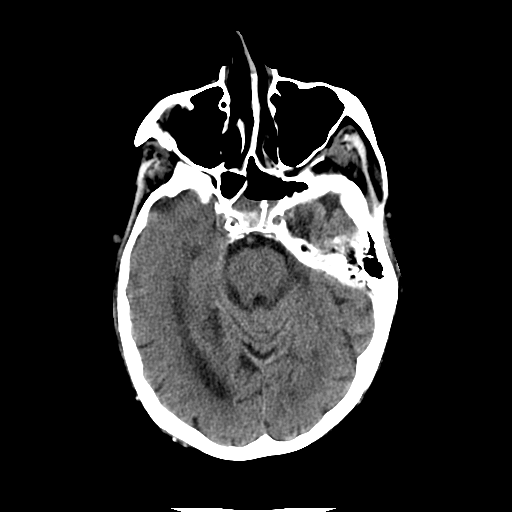
[im 16/35  brain]
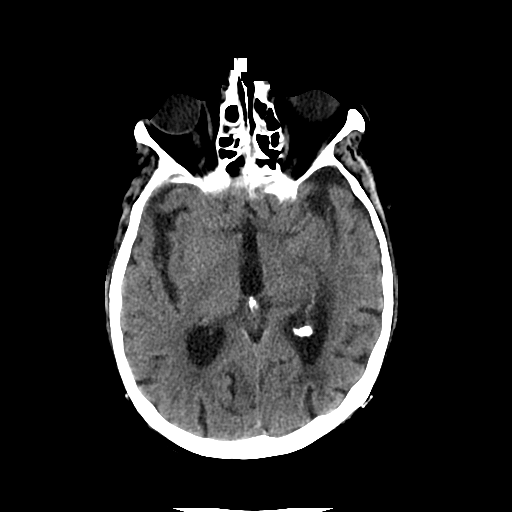
[im 19/35  brain]
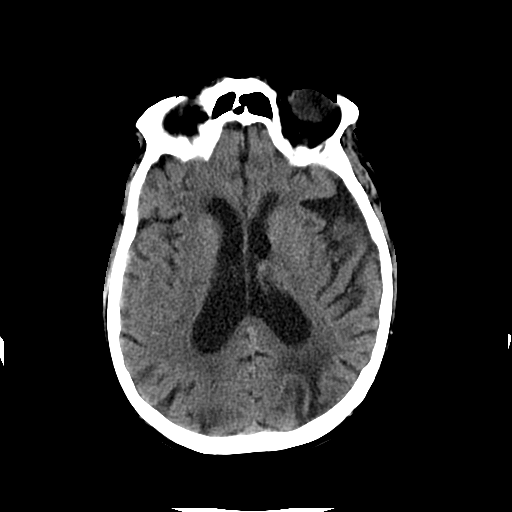
[im 19/35  bone]
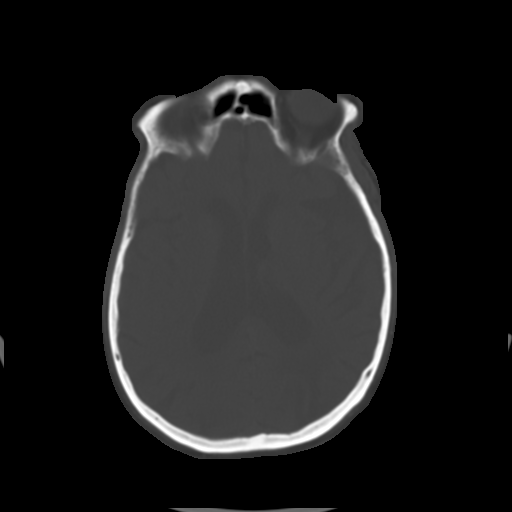
[im 23/35  brain]
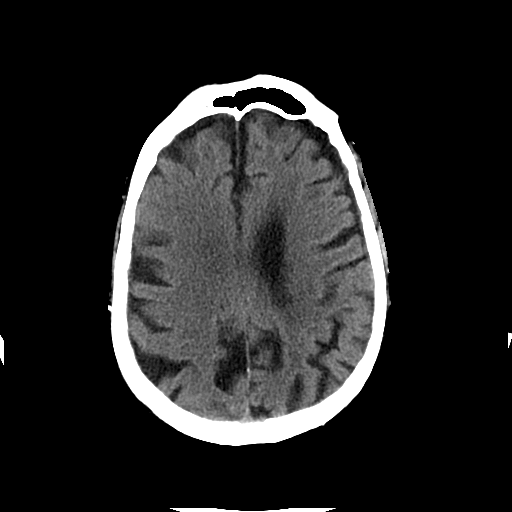
[im 27/35  brain]
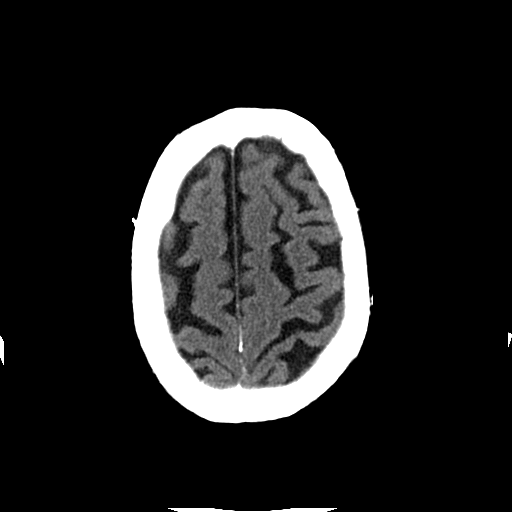
[im 31/35  brain]
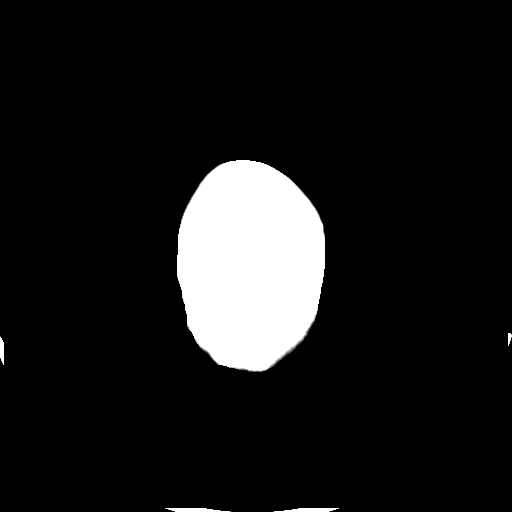

[Series 3: head w/o bone · axial · non-contrast · 0.49mm/px · z∈[+78,+211]mm · 8 of 69 slices shown]
[im 8/69  bone]
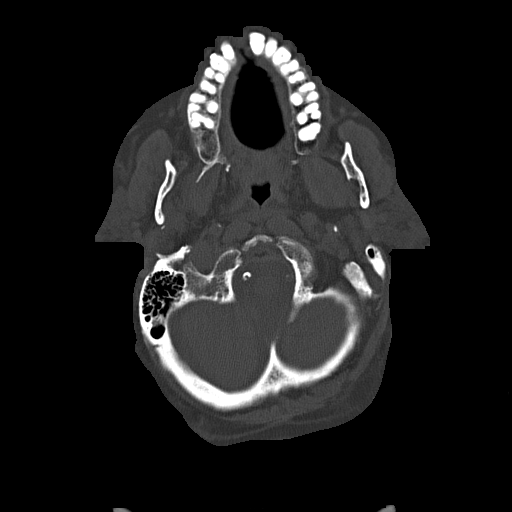
[im 15/69  bone]
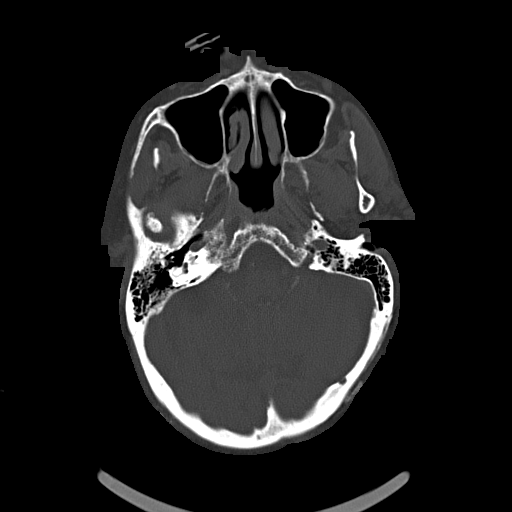
[im 22/69  bone]
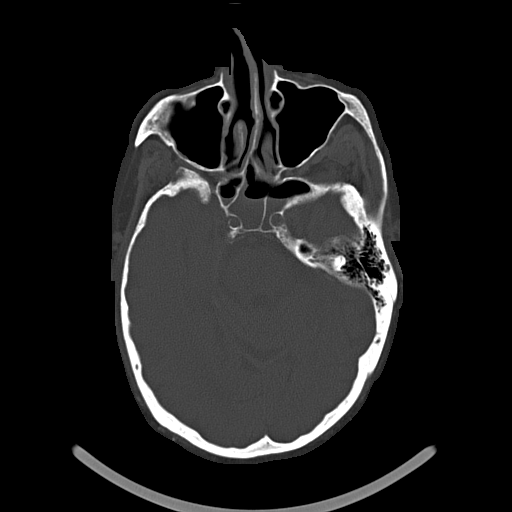
[im 29/69  bone]
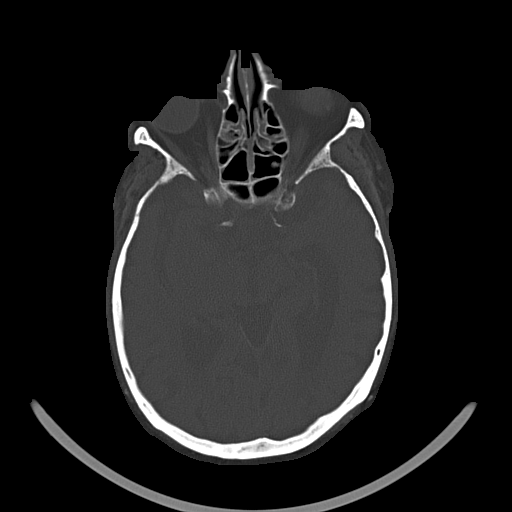
[im 40/69  bone]
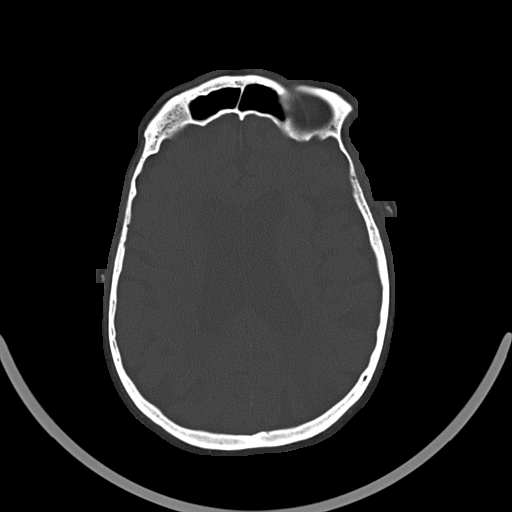
[im 47/69  bone]
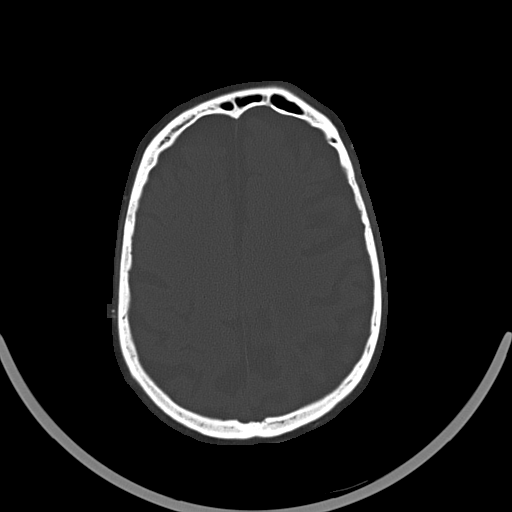
[im 54/69  bone]
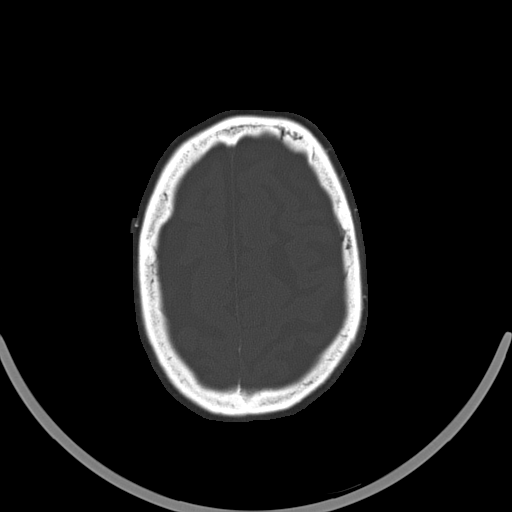
[im 61/69  bone]
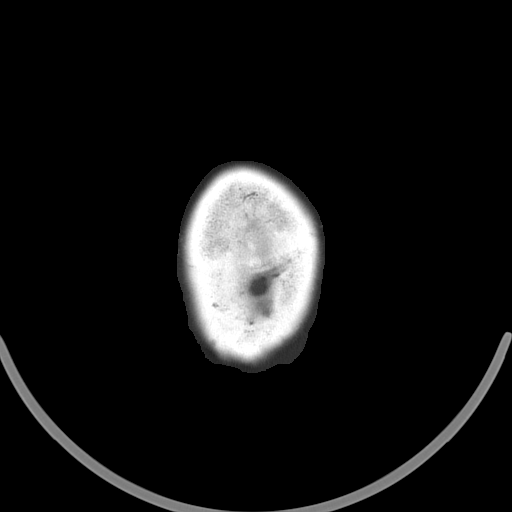

[16 of 30 positions shown; findings below may reference images not displayed]

FINDINGS: Moderate atrophy. Chronic infarct in the left parietal lobe.
Negative for acute infarct. Negative for hemorrhage or mass lesion.

Air-fluid levels in the sphenoid sinus. Mucosal edema in the ethmoid
sinuses. Small air-fluid levels in the frontal sinuses. No acute
bony lesion.
IMPRESSION: Atrophy and chronic left parietal infarct. No acute intracranial
abnormality.

Sinusitis with air-fluid levels.

## 2017-11-02 ENCOUNTER — Encounter: Payer: Self-pay | Admitting: Internal Medicine
